# Patient Record
Sex: Male | Born: 1940
Health system: Southern US, Community
[De-identification: ages and names within clinical notes are randomized; demographics above are authoritative.]

## PROBLEM LIST (undated history)

## (undated) DIAGNOSIS — K2940 Chronic atrophic gastritis without bleeding: Secondary | ICD-10-CM

## (undated) DIAGNOSIS — Z800 Family history of malignant neoplasm of digestive organs: Secondary | ICD-10-CM

## (undated) DIAGNOSIS — Z860101 Personal history of adenomatous and serrated colon polyps: Secondary | ICD-10-CM

## (undated) DIAGNOSIS — J45909 Unspecified asthma, uncomplicated: Secondary | ICD-10-CM

## (undated) DIAGNOSIS — K219 Gastro-esophageal reflux disease without esophagitis: Secondary | ICD-10-CM

## (undated) DIAGNOSIS — G4730 Sleep apnea, unspecified: Secondary | ICD-10-CM

## (undated) DIAGNOSIS — M1990 Unspecified osteoarthritis, unspecified site: Secondary | ICD-10-CM

## (undated) DIAGNOSIS — G629 Polyneuropathy, unspecified: Secondary | ICD-10-CM

## (undated) DIAGNOSIS — K317 Polyp of stomach and duodenum: Secondary | ICD-10-CM

## (undated) DIAGNOSIS — T7840XA Allergy, unspecified, initial encounter: Secondary | ICD-10-CM

## (undated) DIAGNOSIS — S82409A Unspecified fracture of shaft of unspecified fibula, initial encounter for closed fracture: Secondary | ICD-10-CM

## (undated) DIAGNOSIS — Z86010 Personal history of colonic polyps: Secondary | ICD-10-CM

## (undated) DIAGNOSIS — I10 Essential (primary) hypertension: Secondary | ICD-10-CM

## (undated) DIAGNOSIS — D126 Benign neoplasm of colon, unspecified: Secondary | ICD-10-CM

## (undated) DIAGNOSIS — K449 Diaphragmatic hernia without obstruction or gangrene: Secondary | ICD-10-CM

## (undated) DIAGNOSIS — K2270 Barrett's esophagus without dysplasia: Secondary | ICD-10-CM

## (undated) DIAGNOSIS — G4733 Obstructive sleep apnea (adult) (pediatric): Secondary | ICD-10-CM

## (undated) DIAGNOSIS — Z9989 Dependence on other enabling machines and devices: Secondary | ICD-10-CM

## (undated) DIAGNOSIS — R498 Other voice and resonance disorders: Secondary | ICD-10-CM

## (undated) DIAGNOSIS — H269 Unspecified cataract: Secondary | ICD-10-CM

## (undated) DIAGNOSIS — K602 Anal fissure, unspecified: Secondary | ICD-10-CM

## (undated) DIAGNOSIS — C4492 Squamous cell carcinoma of skin, unspecified: Secondary | ICD-10-CM

## (undated) DIAGNOSIS — E785 Hyperlipidemia, unspecified: Secondary | ICD-10-CM

## (undated) HISTORY — DX: Family history of malignant neoplasm of digestive organs: Z80.0

## (undated) HISTORY — DX: Diaphragmatic hernia without obstruction or gangrene: K44.9

## (undated) HISTORY — PX: ESOPHAGOGASTRODUODENOSCOPY: SHX1529

## (undated) HISTORY — DX: Squamous cell carcinoma of skin, unspecified: C44.92

## (undated) HISTORY — DX: Barrett's esophagus without dysplasia: K22.70

## (undated) HISTORY — DX: Allergy, unspecified, initial encounter: T78.40XA

## (undated) HISTORY — DX: Polyp of stomach and duodenum: K31.7

## (undated) HISTORY — DX: Unspecified asthma, uncomplicated: J45.909

## (undated) HISTORY — DX: Sleep apnea, unspecified: G47.30

## (undated) HISTORY — DX: Personal history of adenomatous and serrated colon polyps: Z86.0101

## (undated) HISTORY — PX: CHOLECYSTECTOMY: SHX55

## (undated) HISTORY — DX: Essential (primary) hypertension: I10

## (undated) HISTORY — DX: Dependence on other enabling machines and devices: Z99.89

## (undated) HISTORY — DX: Other voice and resonance disorders: R49.8

## (undated) HISTORY — DX: Obstructive sleep apnea (adult) (pediatric): G47.33

## (undated) HISTORY — DX: Anal fissure, unspecified: K60.2

## (undated) HISTORY — DX: Polyneuropathy, unspecified: G62.9

## (undated) HISTORY — DX: Morbid (severe) obesity due to excess calories: E66.01

## (undated) HISTORY — DX: Unspecified osteoarthritis, unspecified site: M19.90

## (undated) HISTORY — DX: Unspecified fracture of shaft of unspecified fibula, initial encounter for closed fracture: S82.409A

## (undated) HISTORY — PX: POLYPECTOMY: SHX149

## (undated) HISTORY — DX: Unspecified cataract: H26.9

## (undated) HISTORY — DX: Benign neoplasm of colon, unspecified: D12.6

## (undated) HISTORY — DX: Chronic atrophic gastritis without bleeding: K29.40

## (undated) HISTORY — DX: Hyperlipidemia, unspecified: E78.5

## (undated) HISTORY — DX: Personal history of colonic polyps: Z86.010

## (undated) HISTORY — DX: Gastro-esophageal reflux disease without esophagitis: K21.9

## (undated) HISTORY — PX: APPENDECTOMY: SHX54

## (undated) HISTORY — PX: KNEE SURGERY: SHX244

## (undated) HISTORY — PX: COLONOSCOPY: SHX174

## (undated) HISTORY — PX: UPPER GASTROINTESTINAL ENDOSCOPY: SHX188

---

## 1997-11-18 ENCOUNTER — Encounter: Admission: RE | Admit: 1997-11-18 | Discharge: 1998-02-16 | Payer: Self-pay

## 1998-11-17 ENCOUNTER — Other Ambulatory Visit: Admission: RE | Admit: 1998-11-17 | Discharge: 1998-11-17 | Payer: Self-pay | Admitting: Gastroenterology

## 2000-03-20 ENCOUNTER — Ambulatory Visit (HOSPITAL_BASED_OUTPATIENT_CLINIC_OR_DEPARTMENT_OTHER): Admission: RE | Admit: 2000-03-20 | Discharge: 2000-03-20 | Payer: Self-pay | Admitting: Oral & Maxillofacial Surgery

## 2000-03-20 ENCOUNTER — Encounter: Payer: Self-pay | Admitting: Internal Medicine

## 2001-04-03 ENCOUNTER — Encounter: Payer: Self-pay | Admitting: Internal Medicine

## 2001-04-03 ENCOUNTER — Ambulatory Visit (HOSPITAL_BASED_OUTPATIENT_CLINIC_OR_DEPARTMENT_OTHER): Admission: RE | Admit: 2001-04-03 | Discharge: 2001-04-03 | Payer: Self-pay | Admitting: Internal Medicine

## 2003-03-18 ENCOUNTER — Encounter: Admission: RE | Admit: 2003-03-18 | Discharge: 2003-06-16 | Payer: Self-pay | Admitting: Cardiology

## 2004-01-08 ENCOUNTER — Encounter: Admission: RE | Admit: 2004-01-08 | Discharge: 2004-04-07 | Payer: Self-pay | Admitting: Orthopedic Surgery

## 2004-05-13 ENCOUNTER — Ambulatory Visit: Payer: Self-pay | Admitting: Cardiology

## 2005-06-27 HISTORY — PX: INCISION AND DRAINAGE PERIRECTAL ABSCESS: SHX1804

## 2005-10-06 ENCOUNTER — Ambulatory Visit: Payer: Self-pay | Admitting: Cardiology

## 2005-10-27 ENCOUNTER — Ambulatory Visit: Payer: Self-pay | Admitting: Cardiology

## 2006-05-03 ENCOUNTER — Ambulatory Visit: Payer: Self-pay | Admitting: Gastroenterology

## 2006-05-29 ENCOUNTER — Observation Stay (HOSPITAL_COMMUNITY): Admission: EM | Admit: 2006-05-29 | Discharge: 2006-05-29 | Payer: Self-pay | Admitting: Emergency Medicine

## 2006-07-26 ENCOUNTER — Ambulatory Visit: Payer: Self-pay | Admitting: Gastroenterology

## 2006-08-01 ENCOUNTER — Ambulatory Visit: Payer: Self-pay | Admitting: Gastroenterology

## 2006-08-01 ENCOUNTER — Encounter (INDEPENDENT_AMBULATORY_CARE_PROVIDER_SITE_OTHER): Payer: Self-pay | Admitting: *Deleted

## 2006-08-01 DIAGNOSIS — K2940 Chronic atrophic gastritis without bleeding: Secondary | ICD-10-CM | POA: Insufficient documentation

## 2007-05-18 ENCOUNTER — Ambulatory Visit: Payer: Self-pay | Admitting: Cardiology

## 2007-05-18 LAB — CONVERTED CEMR LAB
ALT: 26 units/L (ref 0–53)
AST: 26 units/L (ref 0–37)
Albumin: 4.1 g/dL (ref 3.5–5.2)
CO2: 31 meq/L (ref 19–32)
Calcium: 9.2 mg/dL (ref 8.4–10.5)
Cholesterol: 106 mg/dL (ref 0–200)
Creatinine, Ser: 0.8 mg/dL (ref 0.4–1.5)
GFR calc Af Amer: 124 mL/min
HDL: 38.3 mg/dL — ABNORMAL LOW (ref >39.0)
LDL Cholesterol: 47 mg/dL (ref 0–99)
Potassium: 4.3 meq/L (ref 3.5–5.1)
Total CHOL/HDL Ratio: 2.8

## 2007-06-14 ENCOUNTER — Ambulatory Visit: Payer: Self-pay | Admitting: Cardiology

## 2007-08-20 ENCOUNTER — Ambulatory Visit: Payer: Self-pay | Admitting: Cardiology

## 2007-08-20 LAB — CONVERTED CEMR LAB
ALT: 25 units/L (ref 0–53)
Albumin: 3.8 g/dL (ref 3.5–5.2)
Bilirubin, Direct: 0.1 mg/dL (ref 0.0–0.3)
HDL: 44.2 mg/dL (ref >39.0)
LDL Cholesterol: 99 mg/dL (ref 0–99)
Total Protein: 6.7 g/dL (ref 6.0–8.3)
VLDL: 30 mg/dL (ref 0–40)

## 2007-09-03 ENCOUNTER — Ambulatory Visit: Payer: Self-pay | Admitting: Cardiology

## 2007-09-25 ENCOUNTER — Encounter: Payer: Self-pay | Admitting: Internal Medicine

## 2007-10-12 DIAGNOSIS — J45909 Unspecified asthma, uncomplicated: Secondary | ICD-10-CM | POA: Insufficient documentation

## 2007-10-15 ENCOUNTER — Encounter: Payer: Self-pay | Admitting: Internal Medicine

## 2007-11-05 ENCOUNTER — Ambulatory Visit: Payer: Self-pay | Admitting: Internal Medicine

## 2007-11-05 DIAGNOSIS — G4730 Sleep apnea, unspecified: Secondary | ICD-10-CM | POA: Insufficient documentation

## 2007-11-05 DIAGNOSIS — G4733 Obstructive sleep apnea (adult) (pediatric): Secondary | ICD-10-CM | POA: Insufficient documentation

## 2007-12-23 ENCOUNTER — Encounter: Payer: Self-pay | Admitting: Internal Medicine

## 2007-12-27 ENCOUNTER — Ambulatory Visit: Payer: Self-pay | Admitting: Internal Medicine

## 2008-04-23 ENCOUNTER — Ambulatory Visit: Payer: Self-pay | Admitting: Cardiology

## 2008-04-23 LAB — CONVERTED CEMR LAB
ALT: 25 units/L (ref 0–53)
AST: 23 units/L (ref 0–37)
Albumin: 4 g/dL (ref 3.5–5.2)
BUN: 13 mg/dL (ref 6–23)
Bilirubin, Direct: 0.1 mg/dL (ref 0.0–0.3)
Calcium: 9.2 mg/dL (ref 8.4–10.5)
Creatinine, Ser: 0.8 mg/dL (ref 0.4–1.5)
GFR calc Af Amer: 124 mL/min
Glucose, Bld: 96 mg/dL (ref 70–99)
HDL: 42.3 mg/dL (ref >39.0)
Total CHOL/HDL Ratio: 3.7
Total Protein: 6.9 g/dL (ref 6.0–8.3)
Triglycerides: 122 mg/dL (ref 0–149)
VLDL: 24 mg/dL (ref 0–40)

## 2008-06-10 ENCOUNTER — Ambulatory Visit: Payer: Self-pay | Admitting: Cardiology

## 2008-07-02 ENCOUNTER — Telehealth (INDEPENDENT_AMBULATORY_CARE_PROVIDER_SITE_OTHER): Payer: Self-pay | Admitting: *Deleted

## 2008-07-09 ENCOUNTER — Telehealth: Payer: Self-pay | Admitting: Internal Medicine

## 2008-09-10 ENCOUNTER — Ambulatory Visit: Payer: Self-pay | Admitting: Internal Medicine

## 2008-09-10 DIAGNOSIS — R498 Other voice and resonance disorders: Secondary | ICD-10-CM | POA: Insufficient documentation

## 2008-09-11 DIAGNOSIS — K219 Gastro-esophageal reflux disease without esophagitis: Secondary | ICD-10-CM | POA: Insufficient documentation

## 2008-09-11 DIAGNOSIS — Z860100 Personal history of colon polyps, unspecified: Secondary | ICD-10-CM | POA: Insufficient documentation

## 2008-09-11 DIAGNOSIS — Z87898 Personal history of other specified conditions: Secondary | ICD-10-CM | POA: Insufficient documentation

## 2008-09-11 DIAGNOSIS — K317 Polyp of stomach and duodenum: Secondary | ICD-10-CM | POA: Insufficient documentation

## 2009-02-16 ENCOUNTER — Encounter (INDEPENDENT_AMBULATORY_CARE_PROVIDER_SITE_OTHER): Payer: Self-pay | Admitting: *Deleted

## 2009-04-20 ENCOUNTER — Telehealth: Payer: Self-pay | Admitting: Cardiology

## 2009-05-18 ENCOUNTER — Ambulatory Visit: Payer: Self-pay | Admitting: Internal Medicine

## 2009-05-27 DIAGNOSIS — E785 Hyperlipidemia, unspecified: Secondary | ICD-10-CM | POA: Insufficient documentation

## 2009-06-27 DIAGNOSIS — K2270 Barrett's esophagus without dysplasia: Secondary | ICD-10-CM

## 2009-06-27 HISTORY — DX: Barrett's esophagus without dysplasia: K22.70

## 2009-07-09 ENCOUNTER — Encounter (INDEPENDENT_AMBULATORY_CARE_PROVIDER_SITE_OTHER): Payer: Self-pay | Admitting: *Deleted

## 2009-08-10 ENCOUNTER — Encounter: Payer: Self-pay | Admitting: Cardiology

## 2009-08-12 ENCOUNTER — Encounter (INDEPENDENT_AMBULATORY_CARE_PROVIDER_SITE_OTHER): Payer: Self-pay | Admitting: *Deleted

## 2009-08-13 ENCOUNTER — Ambulatory Visit: Payer: Self-pay | Admitting: Internal Medicine

## 2009-08-18 ENCOUNTER — Ambulatory Visit: Payer: Self-pay | Admitting: Cardiology

## 2009-08-20 ENCOUNTER — Ambulatory Visit: Payer: Self-pay | Admitting: Internal Medicine

## 2009-08-27 ENCOUNTER — Encounter: Payer: Self-pay | Admitting: Internal Medicine

## 2009-09-08 ENCOUNTER — Encounter: Payer: Self-pay | Admitting: Internal Medicine

## 2009-11-26 ENCOUNTER — Encounter: Payer: Self-pay | Admitting: Internal Medicine

## 2010-07-27 NOTE — Letter (Signed)
Summary: Patient Notice- Polyp Results  [facility]  [address] [place], [place] [address]   Phone: [phone]  Fax: [phone]        August 27, 2009 MRN: [id number]    BUDDY LOEFFELHOLZ [address] [place], [place]  [address]    Dear Mr. Digangi,  One of the polyps removed from your colon was adenomatous. This means that it was pre-cancerous or that  it had the potential to change into cancer over time. The other polyp was not a true polyp.  I recommend that you have a repeat colonoscopy in 5 years to determine if you have developed any new polyps over time. If you develop any new rectal bleeding, abdominal pain or significant bowel habit changes, please contact us before then.  The stomach polyps were all benign and none were what we would generally consider to be precancerous. Due to your history of a pre-cancerous stomach polyp and the family history of gastric cancer, I recommend a repeat upper GI endoscopy in 3 years.  By the time you receive this letter you should have heard from [place] about an appointment at the [facility].  Continue treatment plan as outlined the day of your exam.  See me again for a routine follow-up by February 2012.  Please call us if you are having persistent problems or have questions about your condition that have not been fully answered at this time.  Sincerely,  Iva Boop MD, [facility]  This letter has been electronically signed by your physician.  Appended Document: Patient Notice- Polyp Results letter mailed 3.7.11

## 2010-07-27 NOTE — Consult Note (Signed)
Summary: [facility]  [facility]   Imported By: Lennie Odor 09/22/2009 13:52:20  _____________________________________________________________________  External Attachment:    Type:   Image     Comment:   External Document

## 2010-07-27 NOTE — Procedures (Signed)
Summary: Upper Endoscopy  Patient: Albert Barr Note: All result statuses are Final unless otherwise noted.  Tests: (1) Upper Endoscopy (EGD)   EGD Upper Endoscopy       DONE     [facility]     [address]     [place], [place]  [address]           ENDOSCOPY PROCEDURE REPORT           PATIENT:  Albert Barr, Albert Barr  MR#:  [id number]     BIRTHDATE:  Sep 16, 1940, 68 yrs. old  GENDER:  male           ENDOSCOPIST:  Iva Boop, MD, [facility]           PROCEDURE DATE:  08/20/2009     PROCEDURE:  EGD with biopsy     ASA CLASS:  Class II     INDICATIONS:  history of adenomatous gastric polyps (2003)     family history of gastric cancer (mother)           MEDICATIONS:   Fentanyl 75 mcg IV, Versed 8 mg IV     TOPICAL ANESTHETIC:  Exactacain Spray           DESCRIPTION OF PROCEDURE:   After the risks benefits and     alternatives of the procedure were thoroughly explained, informed     consent was obtained.  The LB GIF-H180 [id number] endoscope was     introduced through the mouth and advanced to the second portion of     the duodenum, without limitations.  The instrument was slowly     withdrawn as the mucosa was fully examined.     <<PROCEDUREIMAGES>>           Abnormal appearing mucosa in the bulb of the duodenum.     Nodular/polypoid mucosa in proximal bulb. Multiple biopsies were     obtained and sent to pathology.  There were multiple polyps     identified. in the fundus. Seen in body also. Mostly flat and     pale, especially in fundus. Representative biopsies taken.     Multiple biopsies were obtained and sent to pathology.  Otherwise     the examination was normal. Z-line at 45 cm.    Retroflexed views     revealed Retroflexion exam demonstrated findings as previously     described.    The scope was then withdrawn from the patient and     the procedure completed.           COMPLICATIONS:  None           ENDOSCOPIC IMPRESSION:     1) Abnormal mucosa in the bulb of  duodenum - nodular/polypoid -     biopsied     2) Polyps, multiple in the fundus and body of stomach - biopsied           3) Otherwise normal examination     RECOMMENDATIONS:     1) Await pathology results     He will need referral to [facility] at [facility] after pathology     reviewed (hoarseness).           REPEAT EXAM:  In for EGD, pending biopsy results.           Iva Boop, MD, Albert Barr           CC:  The Patient     Albert Heck, MD  n.     eSIGNED:   Iva Boop at 08/20/2009 11:47 AM           Albert Barr, [id number]  Note: An exclamation mark (!) indicates a result that was not dispersed into the flowsheet. Document Creation Date: 08/20/2009 11:47 AM _______________________________________________________________________  (1) Order result status: Final Collection or observation date-time: 08/20/2009 11:14 Requested date-time:  Receipt date-time:  Reported date-time:  Referring Physician:   Ordering Physician: Albert Barr [phone]) Specimen Source:  Source: Albert Barr Order Number: [id number] Lab site:   Appended Document: Upper Endoscopy Patient  is notified of appointment at [facility] voice center with Dr Delford Field 09-08-09 1:00.  Notes are faxed  Appended Document: Upper Endoscopy     Procedures Next Due Date:    EGD: 08/2012

## 2010-07-27 NOTE — Assessment & Plan Note (Signed)
Summary: f2y per pt call/lg  Medications Added NEXIUM 40 MG  CPDR (ESOMEPRAZOLE MAGNESIUM) 1 tab once daily FISH OIL 1000 MG CAPS (OMEGA-3 FATTY ACIDS) 1  cap two times a day * CPAP 15 APRIA at bedtime VENTOLIN HFA 108 (90 BASE) MCG/ACT AERS (ALBUTEROL SULFATE) as needed        Visit Type:  2 yr f/u Referring Provider:  n/a Primary Provider:  [name], MD   CC:  no cardiac complaints today.  History of Present Illness: Albert Barr comes in today for further evaluation and management of his mixed hyperlipidemia.  His weight continues to be about the same. He is looking into the possibility of bariatric surgery. One reason for this is that now his blood sugar is elevated.  His last cholesterol was 176, triglycerides of 220, LDL 96, HDL 50, total cholesterol HDL ratio 3.5. Fasting blood sugar was 111 and also 118. LFTs were normal.  He's had one episode of vertigo which seems to be positional since I last saw him. He denies any other neurological complaints. He's had no chest pain, angina, orthopnea, PND or peripheral edema.  Current Medications (verified): 1)  Crestor 20 Mg  Tabs (Rosuvastatin Calcium) .... Once Daily 2)  Accolate 20 Mg  Tabs (Zafirlukast) .... Two Times A Day 3)  Androgel Pump 1 %  Gel (Testosterone) .... Once Daily 4)  Propecia 1 Mg  Tabs (Finasteride) .... Once Daily 5)  Nexium 40 Mg  Cpdr (Esomeprazole Magnesium) .Marland Kitchen.. 1 Tab Once Daily 6)  Fish Oil 1000 Mg Caps (Omega-3 Fatty Acids) .Marland Kitchen.. 1  Cap Two Times A Day 7)  Adult Aspirin Ec Low Strength 81 Mg  Tbec (Aspirin) .... Once Daily 8)  Claritin 10 Mg  Tabs (Loratadine) .... Once Daily As Needed 9)  Cpap 15 [employer] .... At Bedtime 10)  Advair Diskus 100-50 Mcg/dose Aepb (Fluticasone-Salmeterol) .... Two Times A Day 11)  Moviprep 100 Gm  Solr (Peg-Kcl-Nacl-Nasulf-Na Asc-C) .... As Per Prep Instructions. 12)  Ventolin Hfa 108 (90 Base) Mcg/act Aers (Albuterol Sulfate) .... As Needed  Allergies: 1)  ! *  Vanceril 2)  ! Avelox  Past History:  Past Medical History: Last updated: 05/27/2009 MORBID OBESITY (ICD-278.01) HYPERLIPIDEMIA-MIXED (ICD-272.4) GERD (ICD-530.81) HIATAL HERNIA (ICD-553.3) HOARSENESS (ICD-784.49) FM HX GASTRIC CANCER - MOTHER (ICD-V16.0) GASTRIC POLYP, HX OF (ICD-V12.79) GASTRITIS, CHRONIC (ICD-535.10) COLONIC POLYPS, ADENOMATOUS, HX OF (ICD-V12.72) SARCOIDOSIS (ICD-135) SLEEP APNEA (ICD-780.57) ASTHMA (ICD-493.90) CHANGE IN BOWELS (ICD-787.99) Sleep Apnea NPSG 03/20/00 AHI 106 (wt 295), cpap 04/03/01 15 cwp- AHI 0 (wt 275)  Past Surgical History: Last updated: 11/05/2007 perirectal abscess- 2007 Appendectomy Cholecystectomy  Family History: Last updated: 05/18/2009 Stomach cancer- mother Bladder cancer- Sister No FH of Colon Cancer:  Social History: Last updated: 09/10/2008 Married Patient states former smoker.  Alcohol Use - yes Daily Caffeine Use Illicit Drug Use - no  Risk Factors: Smoking Status: quit (11/05/2007)  Review of Systems       negative other than history of present illness  Vital Signs:  Patient profile:   70 year old male Height:      69 inches Weight:      304 pounds BMI:     45.06 Pulse rate:   64 / minute Pulse rhythm:   irregular BP sitting:   118 / 80  (left arm) Cuff size:   large  Vitals Entered By: Danielle Rankin, CMA (August 18, 2009 12:36 PM)  Physical Exam  General:  obese.   Head:  normocephalic and  atraumatic Eyes:  PERRLA/EOM intact; conjunctiva and lids normal. Neck:  Neck supple, no JVD. No masses, thyromegaly or abnormal cervical nodes. Lungs:  Clear bilaterally to auscultation and percussion. Heart:  PMI heart to appreciate, soft S1-S2, regular rate and rhythm Msk:  decreased ROM.   Pulses:  pulses normal in all 4 extremities Extremities:  No clubbing or cyanosis.trace left pedal edema and trace right pedal edema.   Neurologic:  Alert and oriented x 3. Skin:  Intact without lesions or  rashes. Psych:  Normal affect.   Impression & Recommendations:  Problem # 1:  HYPERLIPIDEMIA-MIXED (ICD-272.4) Assessment Improved  His updated medication list for this problem includes:    Crestor 20 Mg Tabs (Rosuvastatin calcium) ..... Once daily  Problem # 2:  MORBID OBESITY (ICD-278.01) Assessment: Unchanged  I have encouraged him to look into bariatric surgery. This is particular so with glucose intolerance.  Orders: EKG w/ Interpretation (93000)  Patient Instructions: 1)  Your physician recommends that you schedule a follow-up appointment in: YEAR WITH DR Tzippy Testerman 2)  Your physician recommends that you continue on your current medications as directed. Please refer to the Current Medication list given to you today.

## 2010-07-27 NOTE — Letter (Signed)
Summary: Appointment - Missed  [facility]     [place], [place]    Phone:   Fax:      July 09, 2009 MRN: [id number]   [name] [address] [place], [place]  [address]   Dear Albert Barr,  Our records indicate you missed your appointment on  06-02-2009 with  DrDaleen Squibb it is very important that we reach you to reschedule this appointment. We look forward to participating in your health care needs. Please contact us at the number listed above at your earliest convenience to reschedule this appointment.     Sincerely,   Lorne Skeens  [facility] Scheduling Team

## 2010-07-27 NOTE — Miscellaneous (Signed)
Summary: [facility] Previsit/prep  Clinical Lists Changes  Medications: Added new medication of MOVIPREP 100 GM  SOLR (PEG-KCL-NACL-NASULF-NA ASC-C) As per prep instructions. - Signed Rx of MOVIPREP 100 GM  SOLR (PEG-KCL-NACL-NASULF-NA ASC-C) As per prep instructions.;  #1 x 0;  Signed;  Entered by: Wyona Almas RN;  Authorized by: Iva Boop MD, [facility];  Method used: Electronically to [facility]. #[id number]*, [address], [place], [place], [place]  [address], Ph: [phone], Fax: [phone] Observations: Added new observation of ALLERGY REV: Done (08/13/2009 10:39)    Prescriptions: MOVIPREP 100 GM  SOLR (PEG-KCL-NACL-NASULF-NA ASC-C) As per prep instructions.  #1 x 0   Entered by:   Wyona Almas RN   Authorized by:   Iva Boop MD, [facility]   Signed by:   Wyona Almas RN on 08/13/2009   Method used:   Electronically to        [facility]. [phone]* (retail)       [address]       [place], [place]  [address]       Ph: [phone]       Fax: [phone]   RxID:   [id number]

## 2010-07-27 NOTE — Procedures (Signed)
Summary: Colonoscopy  Patient: Albert Barr Note: All result statuses are Final unless otherwise noted.  Tests: (1) Colonoscopy (COL)   COL Colonoscopy           DONE     [facility]     [address]     [place], [place]  [address]           COLONOSCOPY PROCEDURE REPORT           PATIENT:  [name], [name]  MR#:  [id number]     BIRTHDATE:  1941-06-06, 68 yrs. old  GENDER:  male           ENDOSCOPIST:  Iva Boop, MD, [facility]           PROCEDURE DATE:  08/20/2009     PROCEDURE:  Colonoscopy with biopsy and snare polypectomy     ASA CLASS:  Class III     INDICATIONS:  history of pre-cancerous (adenomatous) colon polyps     multiple adenomas over the years and last had adenomas (small) in     2008           MEDICATIONS:  None           DESCRIPTION OF PROCEDURE:   After the risks benefits and     alternatives of the procedure were thoroughly explained, informed     consent was obtained.  No rectal exam performed. The LB CF-H180AL     [id number] endoscope was introduced through the anus and advanced to     the terminal ileum which was intubated for a short distance,     without limitations.  The quality of the prep was excellent, using     MoviPrep.  The instrument was then slowly withdrawn as the colon     was fully examined. Insrtion: 5:23 minutes Withdrawal: 8:48     minutes     <<PROCEDUREIMAGES>>           FINDINGS:  The terminal ileum appeared normal.  Two polyps were     found in the mid transverse colon. They were diminutive. 2 and 4     mm polyps The 2mm polyp was removed using cold biopsy forceps. The     4 mm polyp was snared without cautery. Retrieval was successful.     This was otherwise a normal examination of the colon including     retroflex exam of the right colon.  Retroflexed views in the     rectum revealed no abnormalities.    The scope was then withdrawn     from the patient and the procedure completed.           COMPLICATIONS:  None         ENDOSCOPIC IMPRESSION:     1) Normal terminal ileum     2) Two polyps in the mid transverse colon - removed     3) Otherwise normal examination, excellent prep     4) Prior adenomatous poly removal, last in 2008           REPEAT EXAM:  In for Colonoscopy, pending biopsy results.           Iva Boop, MD, [facility]           CC:  The Patient     [name], MD           n.     Rosalie Doctor:   Iva Boop at 08/20/2009 11:55 AM  [name], [name], [phone]  Note: An exclamation mark (!) indicates a result that was not dispersed into the flowsheet. Document Creation Date: 08/20/2009 11:55 AM _______________________________________________________________________  (1) Order result status: Final Collection or observation date-time: 08/20/2009 11:35 Requested date-time:  Receipt date-time:  Reported date-time:  Referring Physician:   Ordering Physician: Stan Head [phone]) Specimen Source:  Source: Launa Grill Order Number: [id number] Lab site:   Appended Document: Colonoscopy     Procedures Next Due Date:    Colonoscopy: 08/2014

## 2010-07-27 NOTE — Letter (Signed)
Summary: [facility]lies / [employer]  [facility] / [facility]   Imported By: Lennie Odor 12/01/2009 14:46:45  _____________________________________________________________________  External Attachment:    Type:   Image     Comment:   External Document

## 2010-07-27 NOTE — Letter (Signed)
Summary: [facility] Instructions  [facility]  [address] [place], [place] [address]   Phone: [phone]  Fax: [phone]       Albert Barr    Jan 02, 1941    MRN: [id number]        Procedure Day [name]:  Albert Barr  08/20/09     Arrival Time:  9:30AM     Procedure Time:  10:30AM     Location of Procedure:                    Albert Barr _  [facility] (4th Floor)   PREPARATION FOR COLONOSCOPY WITH MOVIPREP   Starting 5 days prior to your procedure 08/15/09 do not eat nuts, seeds, popcorn, corn, beans, peas,  salads, or any raw vegetables.  Do not take any fiber supplements (e.g. Metamucil, Citrucel, and Benefiber).  THE DAY BEFORE YOUR PROCEDURE         DATE: 08/19/09  DAY: WEDNESDAY  1.  Drink clear liquids the entire day-NO SOLID FOOD  2.  Do not drink anything colored red or purple.  Avoid juices with pulp.  No orange juice.  3.  Drink at least 64 oz. (8 glasses) of fluid/clear liquids during the day to prevent dehydration and help the prep work efficiently.  CLEAR LIQUIDS INCLUDE: Water Jello Ice Popsicles Tea (sugar ok, no milk/cream) Powdered fruit flavored drinks Coffee (sugar ok, no milk/cream) Gatorade Juice: apple, white grape, white cranberry  Lemonade Clear bullion, consomm, broth Carbonated beverages (any kind) Strained chicken noodle soup Hard Candy                             4.  In the morning, mix first dose of MoviPrep solution:    Empty 1 Pouch A and 1 Pouch B into the disposable container    Add lukewarm drinking water to the top line of the container. Mix to dissolve    Refrigerate (mixed solution should be used within 24 hrs)  5.  Begin drinking the prep at 5:00 p.m. The MoviPrep container is divided by 4 marks.   Every 15 minutes drink the solution down to the next mark (approximately 8 oz) until the full liter is complete.   6.  Follow completed prep with 16 oz of clear liquid of your choice (Nothing red or purple).   Continue to drink clear liquids until bedtime.  7.  Before going to bed, mix second dose of MoviPrep solution:    Empty 1 Pouch A and 1 Pouch B into the disposable container    Add lukewarm drinking water to the top line of the container. Mix to dissolve    Refrigerate  THE DAY OF YOUR PROCEDURE      DATE: 08/20/09  DAY: THURSDAY  Beginning at 5:30a.m. (5 hours before procedure):         1. Every 15 minutes, drink the solution down to the next mark (approx 8 oz) until the full liter is complete.  2. Follow completed prep with 16 oz. of clear liquid of your choice.    3. You may drink clear liquids until 8:30AM (2 HOURS BEFORE PROCEDURE).   MEDICATION INSTRUCTIONS  Unless otherwise instructed, you should take regular prescription medications with a small sip of water   as early as possible the morning of your procedure.         OTHER INSTRUCTIONS  You will need a responsible adult at  least 70 years of age to accompany you and drive you home.   This person must remain in the waiting room during your procedure.  Wear loose fitting clothing that is easily removed.  Leave jewelry and other valuables at home.  However, you may wish to bring a book to read or  an iPod/MP3 player to listen to music as you wait for your procedure to start.  Remove all body piercing jewelry and leave at home.  Total time from sign-in until discharge is approximately 2-3 hours.  You should go home directly after your procedure and rest.  You can resume normal activities the  day after your procedure.  The day of your procedure you should not:   Drive   Make legal decisions   Operate machinery   Drink alcohol   Return to work  You will receive specific instructions about eating, activities and medications before you leave.    The above instructions have been reviewed and explained to me by   Albert Almas RN  August 13, 2009 11:06 AM     I fully understand and can verbalize  these instructions _____________________________ Date _________

## 2010-07-29 ENCOUNTER — Encounter: Payer: Self-pay | Admitting: Internal Medicine

## 2010-08-12 NOTE — Letter (Signed)
Summary: [facility] for Supplies / [facility]  [facility] for Supplies / [facility]   Imported By: Lennie Odor 08/04/2010 14:27:14  _____________________________________________________________________  External Attachment:    Type:   Image     Comment:   External Document

## 2010-11-09 NOTE — Assessment & Plan Note (Signed)
[facility]                            CARDIOLOGY OFFICE NOTE   DERIAN, DIMALANTA                  MRN:          [id number]  DATE:06/10/2008                            DOB:          01/07/1941    Albert Barr returns today for followup.   This morning while he was sitting at the breakfast table, he felt a  swoon sensation in his head and it lasted few seconds.  He was little  bit dizzy when he stood up.  He denied any true vertigo, nausea,  vomiting, diaphoresis, or chest discomfort.  He had no tachy  palpitations.  This is an isolated event, he has not had this before.   He continues to work on his weight which is down another 6 pounds.  His  blood work looked remarkably good at goal on Crestor 20 this past month.  I have reviewed those data with him including his other blood work.   MEDICATIONS:  1. Crestor 20 mg a day.  2. Alkylate 20 mg b.i.d.  3. AndroGel 1% cream daily.  4. Propecia 1 mg daily.  5. Nexium 40 mg a day.  6. Fish oil b.i.d.  7. Serevent 50 mg b.i.d.  8. Aspirin 81 mg p.o. q.a.m.   PHYSICAL EXAMINATION:  VITAL SIGNS:  Blood pressure today is 138/80,  pulse of 79 and regular, his weight is 298.  HEENT:  Normal.  PERRLA, extraocular is intact.  Sclera clear.  Facial  symmetry is normal.  Speech is normal.  He is alert and oriented x3.  Cranial nerves II through XII are intact.  Carotid upstrokes were equal  bilaterally without bruits, no JVD.  Thyroid is not enlarged.  Trachea  is midline.  LUNGS:  Clear to auscultation and percussion.  HEART:  Poorly appreciated PMI, soft S1, S2.  No gallop, rub, or murmur.  ABDOMINAL:  Soft, good bowel sounds.  No midline bruit.  No  hepatomegaly.  EXTREMITIES:  No cyanosis, clubbing, or edema.  Pulses are intact.  There is no sign of DVT.   I have reviewed Mr. Livers medications with him.  He will continue as  is and also reviewed his blood work.  If he has another episode of  this  dizziness or any other symptoms he will let me know.  Otherwise, we will  see him back in a year.     Thomas C. Daleen Squibb, MD, [facility]  Electronically Signed   TCW/MedQ  DD: 06/10/2008  DT: 06/11/2008  Job #: [id number]

## 2010-11-09 NOTE — Assessment & Plan Note (Signed)
[facility]                            CARDIOLOGY OFFICE NOTE   Tyrail, [place] ARMANDO BUKHARI                  MRN:          [id number]  DATE:09/03/2007                            DOB:          08/14/1940    Mr. Rosiles comes back today after having an adjustment made by stopping  his Zetia.  He has a history of mixed hyperlipidemia and severe obesity.   His weight shifted back up to about 6 pounds unfortunately. He has some  peripheral edema that is particularly apparent above his sock line.   His labs show on Crestor 20 daily a total cholesterol 173, triglycerides  150, HDL went up 6 points to 44.2, hence total cholesterol HDL ratio  3.9, VLDL was 30 and his LDL is 99.   LFTs were normal.   Other than the edema, he has no complaints.  He denies any ischemic  symptoms.   Blood pressure is 129/62, pulse 74 and regular, weight is 304.  HEENT:  No change.  NECK:  Shows no JVD though difficult to assess.  Carotids are full.  LUNGS:  Clear.  HEART:  Reveals regular rate and rhythm.  No gallop.  ABDOMEN:  Is obese, organomegaly could not be assessed.  EXTREMITIES:  Reveal 2+ pitting edema above the sock line.  His  peripheral pulses are 2+/4+ dorsi dorsalis pedis and posterior tibial.  There is no sign of DVT.  No sign of cellulitis.  NEURO:  Exam is intact.   ASSESSMENT/PLAN:  Mr. Olivero numbers are acceptable off Zetia.  At this  point in time, I would like for him stay on Crestor 20 mg a day.  He  will remain on aspirin and fish oil.  I have advised him to get back on  his weight loss program and increase his activity.  I have also advised  him to get support stockings up to his knees at least.  I have also  instructed him about sitting too long, crossing his legs and also  keeping his legs elevated.  I have also talked about sodium restriction.   I will see him back again in 6 months.     Thomas C. Daleen Squibb, MD, [facility]  Electronically Signed    TCW/MedQ  DD: 09/03/2007  DT: 09/03/2007  Job #: [id number]   cc:   Lonzo Cloud. Kriste Basque, MD

## 2010-11-09 NOTE — Assessment & Plan Note (Signed)
[facility]                            CARDIOLOGY OFFICE NOTE   [name], [name]                  MRN:          [id number]  DATE:06/14/2007                            DOB:          [birth year]    Albert Barr comes in today for further management of his mixed  hyperlipidemia.  He has lost an additional 6 pounds since I last saw  him.  Our last visit was Oct 27, 2005.   He came for blood work and his total cholesterol was down to 106,  triglycerides were 103, HDL was 38.3, down from 53, LDL was 47.  His  total cholesterol with HDL ratio was still low at 2.8.  LFTs were  normal.  Fasting blood sugar was 117, which is mildly elevated, A1c was  5.6, which is normal.   He seems to be doing well overall.  He continues to follow the [facility] Diet as best he can.   CURRENT REGIMEN:  1. Crestor 20 mg a day.  2. Zetia 10 mg a day.  3. Aspirin 81 mg a day.  4. Fish Oil.   Blood pressure is 145/79, his pulse 65 and regular.  His  electrocardiogram is normal except for left axis deviation which is  consistent with a left anterior fascicular block which is stable.  His  weight is 296.  HEENT:  Normocephalic, atraumatic, PERRLA, extraocular movements intact,  sclera clear, facial symmetry is normal.  His skin is kind of cool and  clammy, which is baseline.  His respiratory rate is 18, unlabored.  NECK:  Shows no JVD, carotids are full without bruits.  There is no  thyromegaly.  LUNGS:  Clear.  HEART:  A poorly appreciated PMI.  He has normal S1 S2.  ABDOMINAL EXAM:  Obese, making organomegaly difficult to assess.  EXTREMITIES:  Trace edema, pulses are present.   I had a nice chat with Albert Barr.  I have recommended that he cut out  the Zetia since this is the drug in question clinically, not to mention  that we dropped his HDL from 53 to 38.  His goal LDL certainly should be  in the 70 or less range, which may be very achievable with just  Crestor  and Fish Oil alone.   Will check blood work again in about 3 months.  I will follow up with  him at that time.     Thomas C. Daleen Squibb, MD, [facility]  Electronically Signed    TCW/MedQ  DD: 06/14/2007  DT: 06/15/2007  Job #: [id number]

## 2010-11-12 NOTE — Assessment & Plan Note (Signed)
[place] [facility]                         GASTROENTEROLOGY OFFICE NOTE   BETHEL, GAGLIO                  MRN:          [id number]  DATE:07/26/2006                            DOB:          February 18, 1941    Albert Barr comes in and said that he has had trouble recently with an anal  fistula that was treated with an I&D by Dr. Abbey Chatters at the emergency  room. He also had some problems with hemorrhoids. He has had some recent  GERD problems but says that he has handled it pretty well with his  proton pump inhibitor which now is Nexium 400 mg daily. He takes fish  oil as well as Zetia, baby aspirin, Propecia, AndroGel, Accolate, Advair  and Crestor. He uses Claritin as well.   His last colonoscopic examination did not reveal significant pathology  but the ones before have always revealed multiple polyps, some quite  large. His last upper endoscopic examination revealed changes of chronic  gastritis with moderately large folds and moderate duodenitis. There was  a questionable amount of polyp in the pyloric channel on a previous  upper endoscopic procedure. Albert Barr's mother died from stomach cancer and  I think he is always concerned about this along with the number of  polyps he has always had in his colon.   PHYSICAL EXAMINATION:  He weighs 295, blood pressure 112/68, pulse 80 and regular. Neck, heart  and extremities all basically unchanged.   IMPRESSION:  1. Gastroesophageal reflux disease with gastritis treated with Nexium.  2. Status post multiple colon polyps.  3. Moderate to morbid obesity.  4. Hyperlipidemia.  5. Asthma.  6. Anal fissure.  7. Hemorrhoids.   PLAN:  We are going to schedule him for his procedures, determine the  extent of his hemorrhoids and have him see Dr. Kendrick Ranch for further  therapy if the situation does not improve. He does have some  intermittent bleeding and he is concerned about the bleeding whether it  is hemorrhoidal  or from another possible source in his colon.     Albert Mort, MD  Electronically Signed    SML/MedQ  DD: 07/26/2006  DT: 07/26/2006  Job #: [id number]

## 2010-11-12 NOTE — H&P (Signed)
NAME:  Albert Barr, Albert Barr NO.:  [id number]   MEDICAL RECORD NO.:  [id number]          PATIENT TYPE:  INP   LOCATION:  1511                         FACILITY:  [facility]   PHYSICIAN:  Adolph Pollack, M.D.DATE OF BIRTH:  04-Aug-1940   DATE OF ADMISSION:  05/28/2006  DATE OF DISCHARGE:  05/29/2006                              HISTORY & PHYSICAL   REASON FOR ADMISSION:  Abscess.   HISTORY OF PRESENT ILLNESS:  This 70 year old male had the onset of  perirectal pain on Tuesday, thought it was hemorrhoids, apparently got  some hemorrhoidal cream and tried to treat it, but the pain persisted,  had fever and presented to his primary care physician who examined him  and felt he probably had a perirectal abscess and told him he might need  to have it drained but started  him on oral antibiotics.  He began  having some fever and yesterday still had the pain but felt that he had  not been on the antibiotics long enough.  Subsequently he went to a walk-  in clinic today and was told it definitely needed drainage and was sent  to the emergency department where he was evaluated.  It was felt to be  too complex for drainage in the emergency room;  thus, I was called to  see him.   PAST MEDICAL HISTORY:  1. Hypercholesterolemia.  2. Asthma.  3. Gastroesophageal reflux.   PREVIOUS OPERATIONS:  1. Appendectomy.  2. Laparoscopic cholecystectomy.   ALLERGIES:  None.   MEDICATIONS:  Crestor, Zetia, Nexium, Advair Diskus, AndroGel, aspirin,  Propecia, Keflex, Flagyl, Vicodin.   SOCIAL HISTORY:  He lives with his wife.  Occasional alcohol use.  No  tobacco use.   REVIEW OF SYSTEMS:  CARDIAC: He denies any heart disease or  hypertension. GU: No difficulty with urination.  No kidney stones.  ENDOCRINE: No diabetes or thyroid disease.  NEUROLOGIC:  No strokes or  seizures.   PHYSICAL EXAMINATION:  GENERAL:  A morbidly obese male.  Vex  Weight 311 pounds.  Temperature 98.5, blood  pressure 134/84, pulse  85.  EYES:  Extraocular motions intact.  No icterus.  NECK: Supple without obvious masses.  RESPIRATORY:  Breath sounds equal and clear. Respirations unlabored.  CARDIOVASCULAR:  Demonstrates regular rate, regular rhythm. I do not  hear a murmur.  ABDOMEN: Soft and obese with a right lower quadrant scar.  There is no  tenderness present.  GU/ANORECTAL:  In the left perirectal area, there is a firmness,  induration, and erythema, very tender to touch but no obvious fluctuant  area.  EXTREMITIES: No cyanosis or edema.   LABORATORY DATA:  White count is normal at 9600. Glucose slightly  elevated at 111.   IMPRESSION:  Complex perirectal abscess.   PLAN:  Incision and drainage in the operating room.  I discussed the  procedure and risks with him and his wife.  Risks include but not  limited to bleeding, infection and wound healing problems well as need  for redo operation.  I also told him about the potential for recurrence  of the  abscess or fistula.  They seemed to understand this and agree to  proceed.      Adolph Pollack, M.D.  Electronically Signed     TJR/MEDQ  D:  05/29/2006  T:  05/29/2006  Job:  [id number]   cc:   Jethro Bastos, M.D.  Fax: [phone]

## 2010-11-12 NOTE — Op Note (Signed)
[name], Albert Barr NO.:  [id number]   MEDICAL RECORD NO.:  [id number]          PATIENT TYPE:  INP   LOCATION:  0098                         FACILITY:  [facility]   PHYSICIAN:  Adolph Pollack, M.D.DATE OF BIRTH:  [birth year]   DATE OF PROCEDURE:  05/29/2006  DATE OF DISCHARGE:                               OPERATIVE REPORT   PREOPERATIVE DIAGNOSIS:  Right perirectal abscess.   POSTOPERATIVE DIAGNOSIS:  Right perirectal abscess.   OPERATION PERFORMED:  Complex incision and drainage of right perirectal  abscess.   SURGEON:  Adolph Pollack, M.D.   ANESTHESIA:  General.   INDICATIONS FOR PROCEDURE:  This 70 year old male presented to the  emergency department with increasing pain and was found to have a  perirectal abscess.  It is too large to drain in the emergency room.  He  is now brought to the operating room for drainage.   DESCRIPTION OF PROCEDURE:  He was brought to the operating room and  placed on the operating table and was given a general anesthetic.  He  was then placed in a lithotomy position.  The perianal area sterilely  prepped and draped.  Using a 19 gauge needle I identified the purulent  area in the right perianal/perirectal region.  I then made a triangular  shaped full thickness skin incision.  I then evacuated purulent material  some of which was sent for culture.  I enlarged the incision slightly  and used my finger to break up loculations of purulent material.  I did  not notice any other loculations at this time.  I then controlled the  bleeding with electrocautery.  The tract was approximately 5  to 6 cm  deep in the right perirectal area.  I placed a half inch Penrose drain  up into the tract and anchored it to the skin with 3-0 chromic suture.  I then packed the wound with saline soaked gauze followed by a bulky  dressing.   The patient tolerated the procedure well without any apparent  complication and was taken to recovery  room in satisfactory condition.  He has sleep apnea so we will ask his wife to get a CPAP machine so he  can use it tonight.      Adolph Pollack, M.D.  Electronically Signed     TJR/MEDQ  D:  05/29/2006  T:  05/29/2006  Job:  [id number]

## 2010-11-12 NOTE — Assessment & Plan Note (Signed)
[place] [facility]                           GASTROENTEROLOGY OFFICE NOTE   Albert Barr, Albert Barr                  MRN:          [id number]  DATE:05/03/2006                            DOB:          12/05/1940    Albert Barr comes in, says he has been having some stomach burning, and he had  been taking Zegerid, but still has occasional breakthrough.  We talked about  a number of things about medications and diet, as well as alcohol.  It  appears that he is drinking more alcohol than he needs to be, and we talked  about the risk here and how moderation would be so much better for him,  especially in concern about his overall weight problems.  I think he has  even gotten heavier since I last saw him.  We talked about exercise and how  he needs to be more cognizant of himself and take care of himself.   PHYSICAL EXAMINATION:  He weighted 308 pounds.  Blood pressure 128/60.  Pulse 76 and regular.  NECK, ABDOMEN AND EXTREMITIES:  All unremarkable except for his significant  obesity.   IMPRESSION:  1. Gastroesophageal reflux disease and gastritis, symptomatic even on 1      Zegerid daily.  2. Status post colon polyps.  3. Hyperlipidemia.  4. Morbid obesity.   Recommendation is that he decrease his alcohol intake significantly and  start taking Nexium 1 or 2 a day to be taken at the morning and at dinner  time and I gave him samples and prescriptions for these.  Albert Barr is very  anxious about his colon since he has had multiple polyps in the past and  says he would like to have a followup examination sometime next year, and I  told him that we would scheduled it at his convenience.  I do not think that  an upper endoscopy is necessary unless his symptoms persist.  He did have  some thickened folds consistent with chronic gastritis before, and I think  that if he cuts his alcoholic intake down and takes the Nexium as  prescribed, hopefully his symptoms will  abate.     Albert Mort, MD  Electronically Signed    SML/MedQ  DD: 05/03/2006  DT: 05/04/2006  Job #: [id number]

## 2011-05-26 DIAGNOSIS — K219 Gastro-esophageal reflux disease without esophagitis: Secondary | ICD-10-CM | POA: Insufficient documentation

## 2011-05-26 DIAGNOSIS — E7800 Pure hypercholesterolemia, unspecified: Secondary | ICD-10-CM | POA: Insufficient documentation

## 2011-05-26 DIAGNOSIS — M2550 Pain in unspecified joint: Secondary | ICD-10-CM | POA: Insufficient documentation

## 2011-05-31 ENCOUNTER — Ambulatory Visit: Payer: Medicare Other | Admitting: Rehabilitative and Restorative Service Providers"

## 2011-06-01 ENCOUNTER — Ambulatory Visit: Payer: BC Managed Care – PPO

## 2011-06-06 ENCOUNTER — Ambulatory Visit: Payer: Medicare Other | Attending: Pain Medicine

## 2011-06-06 ENCOUNTER — Ambulatory Visit: Payer: Medicare Other

## 2011-06-06 ENCOUNTER — Ambulatory Visit: Payer: BC Managed Care – PPO

## 2011-06-06 ENCOUNTER — Ambulatory Visit: Payer: Medicare Other | Attending: Orthopedic Surgery

## 2011-06-06 DIAGNOSIS — M25519 Pain in unspecified shoulder: Secondary | ICD-10-CM | POA: Insufficient documentation

## 2011-06-06 DIAGNOSIS — IMO0001 Reserved for inherently not codable concepts without codable children: Secondary | ICD-10-CM | POA: Insufficient documentation

## 2011-06-06 DIAGNOSIS — M2560 Stiffness of unspecified joint, not elsewhere classified: Secondary | ICD-10-CM | POA: Insufficient documentation

## 2011-06-06 DIAGNOSIS — R262 Difficulty in walking, not elsewhere classified: Secondary | ICD-10-CM | POA: Insufficient documentation

## 2011-06-06 DIAGNOSIS — M25569 Pain in unspecified knee: Secondary | ICD-10-CM | POA: Insufficient documentation

## 2011-06-08 ENCOUNTER — Encounter: Payer: Self-pay | Admitting: *Deleted

## 2011-06-08 ENCOUNTER — Ambulatory Visit: Payer: Medicare Other | Admitting: Physical Therapy

## 2011-06-09 ENCOUNTER — Encounter: Payer: Self-pay | Admitting: Cardiology

## 2011-06-09 ENCOUNTER — Ambulatory Visit (INDEPENDENT_AMBULATORY_CARE_PROVIDER_SITE_OTHER): Payer: Medicare Other | Admitting: Cardiology

## 2011-06-09 DIAGNOSIS — E785 Hyperlipidemia, unspecified: Secondary | ICD-10-CM

## 2011-06-09 DIAGNOSIS — Z01818 Encounter for other preprocedural examination: Secondary | ICD-10-CM | POA: Insufficient documentation

## 2011-06-09 NOTE — Assessment & Plan Note (Signed)
I have cleared him for surgery at a low operative risk from a cardiac standpoint. He is having no angina, blood pressure is under good control, and this will have major benefit in terms of his cardiovascular future not to mention hopefully curing his diabetes for a time. It also should help his sleep apnea. I've given him a note for clearance. I'll see him back in a year.

## 2011-06-09 NOTE — Patient Instructions (Signed)
Your physician recommends that you continue on your current medications as directed. Please refer to the Current Medication list given to you today.  Your physician wants you to follow-up in: 1 year with Dr. Daleen Squibb. You will receive a reminder letter in the mail two months in advance. If you don't receive a letter, please call our office to schedule the follow-up appointment.  You have been cleared for your bariatric surgery by Dr. Daleen Squibb today.

## 2011-06-09 NOTE — Progress Notes (Signed)
HPI Albert Barr comes in today for preoperative clearanceFor bariatric surgery for morbid obesity. He is to have this done at [facility] probably in February. He has been going through their program and is actually lost some weight.  He is having no angina or chest discomfort. He has no dyspnea on exertion. He denies orthopnea, PND, edema.  His laboratory data being checked by his primary care. He is borderline diabetic.  Past Medical History  Diagnosis Date  . Morbid obesity   . Other and unspecified hyperlipidemia   . Esophageal reflux   . Diaphragmatic hernia without mention of obstruction or gangrene   . Other voice and resonance disorders   . Atrophic gastritis without mention of hemorrhage   . Sarcoidosis   . Unspecified sleep apnea   . Unspecified asthma   . Change in bowel function   . Obstructive sleep apnea on CPAP   . Gastric polyps     history  . Hx of adenomatous colonic polyps     Current Outpatient Prescriptions  Medication Sig Dispense Refill  . albuterol (PROVENTIL HFA;VENTOLIN HFA) 108 (90 BASE) MCG/ACT inhaler Inhale 2 puffs into the lungs every 6 (six) hours as needed.        Marland Kitchen aspirin 81 MG tablet Take 81 mg by mouth daily.        Marland Kitchen esomeprazole (NEXIUM) 40 MG capsule Take 40 mg by mouth daily before breakfast.        . fish oil-omega-3 fatty acids 1000 MG capsule Take 1 g by mouth 2 (two) times daily.        . Fluticasone-Salmeterol (ADVAIR) 100-50 MCG/DOSE AEPB Inhale 1 puff into the lungs every 12 (twelve) hours.        Marland Kitchen loratadine (CLARITIN) 10 MG tablet Take 10 mg by mouth daily.        . rosuvastatin (CRESTOR) 20 MG tablet Take 20 mg by mouth daily.        Marland Kitchen testosterone (ANDROGEL) 50 MG/5GM GEL Place 5 g onto the skin daily.        . zafirlukast (ACCOLATE) 20 MG tablet Take 20 mg by mouth 2 (two) times daily.          Allergies  Allergen Reactions  . Moxifloxacin     REACTION: Diarrhea    Family History  Problem Relation Age of Onset  .  Stomach cancer Mother   . Cancer Sister     bladder    History   Social History  . Marital Status: Married    Spouse Name: N/A    Number of Children: N/A  . Years of Education: N/A   Occupational History  . Not on file.   Social History Main Topics  . Smoking status: Former Games developer  . Smokeless tobacco: Never Used  . Alcohol Use: Yes  . Drug Use: No  . Sexually Active: Not on file   Other Topics Concern  . Not on file   Social History Narrative  . No narrative on file    ROS ALL NEGATIVE EXCEPT THOSE NOTED IN HPI  PE  General Appearance: well developed, well nourished in no acute distress, obese HEENT: symmetrical face, PERRLA, good dentition  Neck: no JVD, thyromegaly, or adenopathy, trachea midline Chest: symmetric without deformity Cardiac: PMI non-displaced, RRR, normal S1, S2, no gallop or murmur Lung: clear to ausculation and percussion Vascular: all pulses full without bruits  Abdominal: nondistended, nontender, good bowel sounds, no HSM, no bruits Extremities: no cyanosis, clubbing ,  trace edema, no sign of DVT, no varicosities  Skin: normal color, no rashes Neuro: alert and oriented x 3, non-focal Pysch: normal affect  EKG Sinus bradycardia, PACs, left axis deviation. BMET    Component Value Date/Time   NA 145 04/23/2008 1012   K 4.1 04/23/2008 1012   CL 108 04/23/2008 1012   CO2 31 04/23/2008 1012   GLUCOSE 96 04/23/2008 1012   BUN 13 04/23/2008 1012   CREATININE 0.8 04/23/2008 1012   CALCIUM 9.2 04/23/2008 1012   GFRNONAA 102 04/23/2008 1012   GFRAA 124 04/23/2008 1012    Lipid Panel     Component Value Date/Time   CHOL 156 04/23/2008 1012   TRIG 122 04/23/2008 1012   HDL 42.3 04/23/2008 1012   CHOLHDL 3.7 CALC 04/23/2008 1012   VLDL 24 04/23/2008 1012   LDLCALC 89 04/23/2008 1012    CBC No results found for this basename: wbc, rbc, hgb, hct, plt, mcv, mch, mchc, rdw, neutrabs, lymphsabs, monoabs, eosabs, basosabs

## 2011-06-15 ENCOUNTER — Ambulatory Visit: Payer: Medicare Other

## 2011-06-17 ENCOUNTER — Ambulatory Visit: Payer: Medicare Other

## 2011-07-05 ENCOUNTER — Ambulatory Visit: Payer: BC Managed Care – PPO | Attending: Pain Medicine

## 2011-07-05 DIAGNOSIS — IMO0001 Reserved for inherently not codable concepts without codable children: Secondary | ICD-10-CM | POA: Insufficient documentation

## 2011-07-05 DIAGNOSIS — M25519 Pain in unspecified shoulder: Secondary | ICD-10-CM | POA: Insufficient documentation

## 2011-07-05 DIAGNOSIS — M25569 Pain in unspecified knee: Secondary | ICD-10-CM | POA: Insufficient documentation

## 2011-07-05 DIAGNOSIS — M2560 Stiffness of unspecified joint, not elsewhere classified: Secondary | ICD-10-CM | POA: Insufficient documentation

## 2011-07-05 DIAGNOSIS — R262 Difficulty in walking, not elsewhere classified: Secondary | ICD-10-CM | POA: Insufficient documentation

## 2011-07-07 ENCOUNTER — Ambulatory Visit: Payer: BC Managed Care – PPO

## 2011-07-11 ENCOUNTER — Ambulatory Visit: Payer: BC Managed Care – PPO

## 2011-07-19 ENCOUNTER — Ambulatory Visit: Payer: BC Managed Care – PPO

## 2011-07-21 ENCOUNTER — Ambulatory Visit: Payer: BC Managed Care – PPO

## 2011-07-26 ENCOUNTER — Ambulatory Visit: Payer: BC Managed Care – PPO

## 2011-07-28 ENCOUNTER — Ambulatory Visit: Payer: BC Managed Care – PPO

## 2011-08-01 ENCOUNTER — Ambulatory Visit: Payer: BC Managed Care – PPO | Attending: Pain Medicine

## 2011-08-01 DIAGNOSIS — M2560 Stiffness of unspecified joint, not elsewhere classified: Secondary | ICD-10-CM | POA: Insufficient documentation

## 2011-08-01 DIAGNOSIS — M25569 Pain in unspecified knee: Secondary | ICD-10-CM | POA: Insufficient documentation

## 2011-08-01 DIAGNOSIS — M25519 Pain in unspecified shoulder: Secondary | ICD-10-CM | POA: Insufficient documentation

## 2011-08-01 DIAGNOSIS — IMO0001 Reserved for inherently not codable concepts without codable children: Secondary | ICD-10-CM | POA: Insufficient documentation

## 2011-08-01 DIAGNOSIS — R262 Difficulty in walking, not elsewhere classified: Secondary | ICD-10-CM | POA: Insufficient documentation

## 2011-08-08 ENCOUNTER — Ambulatory Visit: Payer: BC Managed Care – PPO

## 2011-08-11 ENCOUNTER — Ambulatory Visit: Payer: BC Managed Care – PPO

## 2011-08-16 ENCOUNTER — Ambulatory Visit: Payer: BC Managed Care – PPO

## 2011-08-18 ENCOUNTER — Ambulatory Visit: Payer: BC Managed Care – PPO

## 2011-08-29 ENCOUNTER — Ambulatory Visit: Payer: BC Managed Care – PPO | Attending: Orthopedic Surgery

## 2011-08-29 DIAGNOSIS — M2560 Stiffness of unspecified joint, not elsewhere classified: Secondary | ICD-10-CM | POA: Insufficient documentation

## 2011-08-29 DIAGNOSIS — IMO0001 Reserved for inherently not codable concepts without codable children: Secondary | ICD-10-CM | POA: Insufficient documentation

## 2011-08-29 DIAGNOSIS — M25519 Pain in unspecified shoulder: Secondary | ICD-10-CM | POA: Insufficient documentation

## 2011-09-09 ENCOUNTER — Ambulatory Visit: Payer: BC Managed Care – PPO

## 2011-09-19 ENCOUNTER — Encounter: Payer: Self-pay | Admitting: *Deleted

## 2011-09-20 ENCOUNTER — Ambulatory Visit (INDEPENDENT_AMBULATORY_CARE_PROVIDER_SITE_OTHER): Payer: BC Managed Care – PPO | Admitting: Internal Medicine

## 2011-09-20 ENCOUNTER — Encounter: Payer: Self-pay | Admitting: Internal Medicine

## 2011-09-20 VITALS — BP 116/60 | HR 64 | Ht 70.0 in | Wt 283.0 lb

## 2011-09-20 DIAGNOSIS — R195 Other fecal abnormalities: Secondary | ICD-10-CM

## 2011-09-20 DIAGNOSIS — R490 Dysphonia: Secondary | ICD-10-CM

## 2011-09-20 DIAGNOSIS — K219 Gastro-esophageal reflux disease without esophagitis: Secondary | ICD-10-CM

## 2011-09-20 DIAGNOSIS — K317 Polyp of stomach and duodenum: Secondary | ICD-10-CM

## 2011-09-20 DIAGNOSIS — D131 Benign neoplasm of stomach: Secondary | ICD-10-CM

## 2011-09-20 NOTE — Progress Notes (Signed)
Patient ID: Albert Simmers Sr., male   DOB: 1940/09/23, 71 y.o.   MRN: [id number]  Patient returns for followup. He has been seen in the past because of a history of adenomatous and fundic gland gastric polyps. He has a history of colon polyps as well.  He is considering bariatric surgery at [facility]. He has lost about 30 pounds and this can graduated. He has a followup with the bariatric surgeon, Dr. Lily Peer coming up next month.  He reports that he had an upper GI series there there was some question of aspiration. It does not sound like he's had a modified barium swallow. The patient does have some chronic hoarseness issues. He also has GERD.  He continues to have loose stools at times though it's usually the second stool the day. This is not really a new symptom.  Review of systems also notable for that he has developed an idiopathic peripheral neuropathy.  Allergies  Allergen Reactions  . Moxifloxacin     REACTION: Diarrhea   Outpatient Prescriptions Prior to Visit  Medication Sig Dispense Refill  . albuterol (PROVENTIL HFA;VENTOLIN HFA) 108 (90 BASE) MCG/ACT inhaler Inhale 2 puffs into the lungs every 6 (six) hours as needed.        Marland Kitchen aspirin 81 MG tablet Take 81 mg by mouth daily.        Marland Kitchen esomeprazole (NEXIUM) 40 MG capsule Take 40 mg by mouth daily before breakfast.        . fish oil-omega-3 fatty acids 1000 MG capsule Take 1 g by mouth 2 (two) times daily.        . Fluticasone-Salmeterol (ADVAIR) 100-50 MCG/DOSE AEPB Inhale 1 puff into the lungs every 12 (twelve) hours.        Marland Kitchen loratadine (CLARITIN) 10 MG tablet Take 10 mg by mouth daily.        . rosuvastatin (CRESTOR) 20 MG tablet Take 20 mg by mouth daily.        Marland Kitchen testosterone (ANDROGEL) 50 MG/5GM GEL Place 5 g onto the skin daily.        . zafirlukast (ACCOLATE) 20 MG tablet Take 20 mg by mouth 2 (two) times daily.         Past Medical History  Diagnosis Date  . Morbid obesity   . Hyperlipidemia   .  Esophageal reflux   . Hiatal hernia   . Other voice and resonance disorders   . Atrophic gastritis without mention of hemorrhage   . Unspecified asthma   . Obstructive sleep apnea on CPAP   . Gastric polyps     history  . Hx of adenomatous colonic polyps   . Metaplasia of esophagus 2011    "gastric metaplasia"  . Anal fissure   . Peripheral neuropathy    Past Surgical History  Procedure Date  . Incision and drainage perirectal abscess 2007  . Appendectomy   . Cholecystectomy   . Colonoscopy 08/20/2009 (multiple)    Tubular adenoma  . Esophagogastroduodenoscopy 08/20/09 (multiple)   History   Social History  . Marital Status: Married    Spouse Name: N/A    Number of Children: 3  .     Occupational History  . 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 Smoking status: Former Games developer  . Smokeless tobacco: Never Used  . Alcohol Use: Yes     moderate  . Drug Use: No  .  Family History  Problem Relation Age of Onset  . Stomach cancer Mother   . Cancer Sister     bladder  . Colon cancer Neg Hx       Assessment:  1. GERD (gastroesophageal reflux disease)   Stable - on Nexium  2. Morbid obesity   Improved   3. Hoarseness   Chronic - ? If could be related to possible aspiration  4. Loose stools - chronic  5. Gastric polyps - adenomatous and fundic gland polyps in past   Plan:  #1 I support the concept of bariatric surgery as therapy for him. He has lost weight with diet, that is typically not as long lasting and he has significant potential benefits with bariatric surgery..  #2 should he go forward with bariatric surgery, I think it would be reasonable to have a followup EGD sooner than 3 years from the last as planned. Essentially would most likely perform this preoperatively, if Dr. Lily Peer agrees. Patient may call back to arrange this pending his visit with Dr. Lily Peer  #3 he apparently has possible aspiration based on  the upper GI series. I will defer to Dr. Lily Peer for any further testing since he knows her results, and if it has any bearing upon possible surgery.  It sounds like a modified barium swallow would be reasonable.  Cc: Dr. Toni Amend [facility] - Surgery department), Dr. Adrian Prince

## 2011-09-20 NOTE — Patient Instructions (Signed)
Please call back and ask for Albert Barr to set up an Endoscopy if you decide to do that.  She will be happy to assist you.

## 2011-11-03 DIAGNOSIS — IMO0002 Reserved for concepts with insufficient information to code with codable children: Secondary | ICD-10-CM | POA: Insufficient documentation

## 2011-11-03 DIAGNOSIS — H40009 Preglaucoma, unspecified, unspecified eye: Secondary | ICD-10-CM | POA: Insufficient documentation

## 2011-11-03 DIAGNOSIS — H33329 Round hole, unspecified eye: Secondary | ICD-10-CM | POA: Insufficient documentation

## 2011-11-03 DIAGNOSIS — H43819 Vitreous degeneration, unspecified eye: Secondary | ICD-10-CM | POA: Insufficient documentation

## 2011-12-02 ENCOUNTER — Ambulatory Visit: Payer: BC Managed Care – PPO | Attending: Orthopedic Surgery | Admitting: Physical Therapy

## 2011-12-02 DIAGNOSIS — IMO0001 Reserved for inherently not codable concepts without codable children: Secondary | ICD-10-CM | POA: Insufficient documentation

## 2011-12-02 DIAGNOSIS — M25519 Pain in unspecified shoulder: Secondary | ICD-10-CM | POA: Insufficient documentation

## 2011-12-02 DIAGNOSIS — M2560 Stiffness of unspecified joint, not elsewhere classified: Secondary | ICD-10-CM | POA: Insufficient documentation

## 2011-12-07 ENCOUNTER — Ambulatory Visit: Payer: BC Managed Care – PPO | Attending: Endocrinology | Admitting: Physical Therapy

## 2011-12-07 DIAGNOSIS — R269 Unspecified abnormalities of gait and mobility: Secondary | ICD-10-CM | POA: Insufficient documentation

## 2011-12-07 DIAGNOSIS — IMO0001 Reserved for inherently not codable concepts without codable children: Secondary | ICD-10-CM | POA: Insufficient documentation

## 2011-12-09 ENCOUNTER — Ambulatory Visit: Payer: BC Managed Care – PPO | Admitting: Physical Therapy

## 2011-12-20 ENCOUNTER — Ambulatory Visit: Payer: BC Managed Care – PPO | Admitting: Physical Therapy

## 2011-12-23 ENCOUNTER — Ambulatory Visit: Payer: BC Managed Care – PPO | Admitting: Physical Therapy

## 2011-12-26 ENCOUNTER — Ambulatory Visit: Payer: BC Managed Care – PPO | Admitting: Physical Therapy

## 2011-12-28 ENCOUNTER — Ambulatory Visit: Payer: BC Managed Care – PPO | Attending: Orthopedic Surgery | Admitting: Physical Therapy

## 2011-12-28 DIAGNOSIS — M2560 Stiffness of unspecified joint, not elsewhere classified: Secondary | ICD-10-CM | POA: Insufficient documentation

## 2011-12-28 DIAGNOSIS — M25519 Pain in unspecified shoulder: Secondary | ICD-10-CM | POA: Insufficient documentation

## 2011-12-28 DIAGNOSIS — IMO0001 Reserved for inherently not codable concepts without codable children: Secondary | ICD-10-CM | POA: Insufficient documentation

## 2012-01-10 ENCOUNTER — Ambulatory Visit: Payer: BC Managed Care – PPO | Admitting: Physical Therapy

## 2012-01-12 ENCOUNTER — Ambulatory Visit: Payer: BC Managed Care – PPO | Admitting: Physical Therapy

## 2012-01-16 ENCOUNTER — Ambulatory Visit: Payer: BC Managed Care – PPO | Admitting: Physical Therapy

## 2012-02-08 ENCOUNTER — Ambulatory Visit: Payer: BC Managed Care – PPO | Attending: Orthopedic Surgery | Admitting: Physical Therapy

## 2012-02-08 DIAGNOSIS — M2560 Stiffness of unspecified joint, not elsewhere classified: Secondary | ICD-10-CM | POA: Diagnosis not present

## 2012-02-08 DIAGNOSIS — IMO0001 Reserved for inherently not codable concepts without codable children: Secondary | ICD-10-CM | POA: Insufficient documentation

## 2012-02-08 DIAGNOSIS — M25519 Pain in unspecified shoulder: Secondary | ICD-10-CM | POA: Insufficient documentation

## 2012-02-10 ENCOUNTER — Ambulatory Visit: Payer: BC Managed Care – PPO | Admitting: *Deleted

## 2012-06-14 DIAGNOSIS — M792 Neuralgia and neuritis, unspecified: Secondary | ICD-10-CM | POA: Insufficient documentation

## 2012-06-14 DIAGNOSIS — R2689 Other abnormalities of gait and mobility: Secondary | ICD-10-CM | POA: Insufficient documentation

## 2012-06-14 DIAGNOSIS — G629 Polyneuropathy, unspecified: Secondary | ICD-10-CM | POA: Insufficient documentation

## 2012-08-13 ENCOUNTER — Encounter: Payer: Self-pay | Admitting: Internal Medicine

## 2012-10-25 ENCOUNTER — Other Ambulatory Visit: Payer: Self-pay | Admitting: Dermatology

## 2013-03-18 ENCOUNTER — Encounter: Payer: Self-pay | Admitting: Internal Medicine

## 2013-05-16 ENCOUNTER — Ambulatory Visit (INDEPENDENT_AMBULATORY_CARE_PROVIDER_SITE_OTHER): Payer: Medicare Other | Admitting: Internal Medicine

## 2013-05-16 ENCOUNTER — Encounter: Payer: Self-pay | Admitting: Internal Medicine

## 2013-05-16 VITALS — BP 124/70 | HR 64 | Ht 69.0 in | Wt 297.2 lb

## 2013-05-16 DIAGNOSIS — K2940 Chronic atrophic gastritis without bleeding: Secondary | ICD-10-CM

## 2013-05-16 DIAGNOSIS — D131 Benign neoplasm of stomach: Secondary | ICD-10-CM

## 2013-05-16 DIAGNOSIS — Z800 Family history of malignant neoplasm of digestive organs: Secondary | ICD-10-CM | POA: Insufficient documentation

## 2013-05-16 DIAGNOSIS — Z8719 Personal history of other diseases of the digestive system: Secondary | ICD-10-CM

## 2013-05-16 NOTE — Progress Notes (Signed)
  Subjective:    Patient ID: Albert Simmers Sr., male    DOB: 07/15/1940, 72 y.o.   MRN: [id number]  HPI Doing well without GI c/o. Wt Readings from Last 3 Encounters:  05/16/13 297 lb 4 oz (134.832 kg)  09/20/11 283 lb (128.368 kg)  06/09/11 293 lb (132.904 kg)  Medications, allergies, past medical history, past surgical history, family history and social history are reviewed and updated in the EMR.  Review of Systems Hairline tibial fracture RLE - 3 weeks ago    Objective:   Physical Exam General:  NAD, obese Eyes:   anicteric Lungs:  clear Heart:  S1S2 no rubs, murmurs or gallops  Data Reviewed:   2011 EGD and pathology    Assessment & Plan:  Benign neoplasm of stomach  Family history of gastric cancer

## 2013-05-16 NOTE — Patient Instructions (Signed)
You have been scheduled for an endoscopy with propofol. Please follow written instructions given to you at your visit today. If you use inhalers (even only as needed), please bring them with you on the day of your procedure. Your physician has requested that you go to www.startemmi.com and enter the access code given to you at your visit today. This web site gives a general overview about your procedure. However, you should still follow specific instructions given to you by our office regarding your preparation for the procedure.  I appreciate the opportunity to care for you.  

## 2013-05-16 NOTE — Assessment & Plan Note (Signed)
F/u EGD given polyps and FHx gastric ca

## 2013-05-16 NOTE — Assessment & Plan Note (Addendum)
Schedule EGD to evaluate for more neoplastic polyps and because of family hx gastric cancer The risks and benefits as well as alternatives of endoscopic procedure(s) have been discussed and reviewed. All questions answered. The patient agrees to proceed.

## 2013-05-17 ENCOUNTER — Encounter: Payer: Self-pay | Admitting: Internal Medicine

## 2013-07-10 ENCOUNTER — Other Ambulatory Visit: Payer: Self-pay | Admitting: Orthopedic Surgery

## 2013-07-10 DIAGNOSIS — M549 Dorsalgia, unspecified: Secondary | ICD-10-CM

## 2013-07-12 ENCOUNTER — Telehealth: Payer: Self-pay | Admitting: Internal Medicine

## 2013-07-12 NOTE — Telephone Encounter (Signed)
Spoke with Manuela Schwartz patient's wife and gave her instructions for EGD.

## 2013-07-15 ENCOUNTER — Encounter: Payer: Self-pay | Admitting: Internal Medicine

## 2013-07-15 ENCOUNTER — Ambulatory Visit (AMBULATORY_SURGERY_CENTER): Payer: Medicare Other | Admitting: Internal Medicine

## 2013-07-15 VITALS — BP 116/71 | HR 57 | Temp 97.2°F | Resp 19 | Ht 69.0 in | Wt 297.0 lb

## 2013-07-15 DIAGNOSIS — D131 Benign neoplasm of stomach: Secondary | ICD-10-CM

## 2013-07-15 DIAGNOSIS — Z800 Family history of malignant neoplasm of digestive organs: Secondary | ICD-10-CM

## 2013-07-15 MED ORDER — SODIUM CHLORIDE 0.9 % IV SOLN: 500.0000 mL | INTRAVENOUS | Status: DC

## 2013-07-15 NOTE — Progress Notes (Signed)
Called to room to assist during endoscopic procedure.  Patient ID and intended procedure confirmed with present staff. Received instructions for my participation in the procedure from the performing physician.  

## 2013-07-15 NOTE — Progress Notes (Signed)
A/ox3 pleased with MAC, report to Annette RN 

## 2013-07-15 NOTE — Patient Instructions (Addendum)
You have several small polyps in the stomach as before. They do not look concerning but I took biopsies as usual. Everything else looked ok. I will send a letter to you re: polyp biopsies and plans.  I appreciate the opportunity to care for you. Gatha Mayer, MD, FACG     YOU HAD AN ENDOSCOPIC PROCEDURE TODAY AT [facility]: Refer to the procedure report that was given to you for any specific questions about what was found during the examination.  If the procedure report does not answer your questions, please call your gastroenterologist to clarify.  If you requested that your care partner not be given the details of your procedure findings, then the procedure report has been included in a sealed envelope for you to review at your convenience later.  YOU SHOULD EXPECT: Some feelings of bloating in the abdomen. Passage of more gas than usual.  Walking can help get rid of the air that was put into your GI tract during the procedure and reduce the bloating. If you had a lower endoscopy (such as a colonoscopy or flexible sigmoidoscopy) you may notice spotting of blood in your stool or on the toilet paper. If you underwent a bowel prep for your procedure, then you may not have a normal bowel movement for a few days.  DIET: Your first meal following the procedure should be a light meal and then it is ok to progress to your normal diet.  A half-sandwich or bowl of soup is an example of a good first meal.  Heavy or fried foods are harder to digest and may make you feel nauseous or bloated.  Likewise meals heavy in dairy and vegetables can cause extra gas to form and this can also increase the bloating.  Drink plenty of fluids but you should avoid alcoholic beverages for 24 hours.  ACTIVITY: Your care partner should take you home directly after the procedure.  You should plan to take it easy, moving slowly for the rest of the day.  You can resume normal activity the day after the procedure  however you should NOT DRIVE or use heavy machinery for 24 hours (because of the sedation medicines used during the test).    SYMPTOMS TO REPORT IMMEDIATELY: A gastroenterologist can be reached at any hour.  During normal business hours, 8:30 AM to 5:00 PM Monday through Friday, call [phone].  After hours and on weekends, please call the GI answering service at [phone] who will take a message and have the physician on call contact you.     Following upper endoscopy (EGD)  Vomiting of blood or coffee ground material  New chest pain or pain under the shoulder blades  Painful or persistently difficult swallowing  New shortness of breath  Fever of 100F or higher  Black, tarry-looking stools  FOLLOW UP: If any biopsies were taken you will be contacted by phone or by letter within the next 1-3 weeks.  Call your gastroenterologist if you have not heard about the biopsies in 3 weeks.  Our staff will call the home number listed on your records the next business day following your procedure to check on you and address any questions or concerns that you may have at that time regarding the information given to you following your procedure. This is a courtesy call and so if there is no answer at the home number and we have not heard from you through the emergency physician on call, we will  assume that you have returned to your regular daily activities without incident.  SIGNATURES/CONFIDENTIALITY: You and/or your care partner have signed paperwork which will be entered into your electronic medical record.  These signatures attest to the fact that that the information above on your After Visit Summary has been reviewed and is understood.  Full responsibility of the confidentiality of this discharge information lies with you and/or your care-partner.    You may resume your current medications today. Please call if any questions or concerns.

## 2013-07-15 NOTE — Op Note (Signed)
[address]  [address] [place], [id number]   ENDOSCOPY PROCEDURE REPORT  PATIENT: Albert Barr, Albert  Barr.  MR#: [id number] BIRTHDATE: 08/19/1940 , 72  yrs. old GENDER: Male ENDOSCOPIST: Gatha Mayer, MD, [facility] PROCEDURE DATE:  07/15/2013 PROCEDURE:  EGD w/ biopsy ASA CLASS:     Class III INDICATIONS:  Surveillance.  gastric polyps and family hx gastric cancer (mother) MEDICATIONS: propofol (Diprivan) 150mg  IV, MAC sedation, administered by CRNA, and These medications were titrated to patient response per physician's verbal order TOPICAL ANESTHETIC: none  DESCRIPTION OF PROCEDURE: After the risks benefits and alternatives of the procedure were thoroughly explained, informed consent was obtained.  The LB [id number] endoscope was introduced through the mouth and advanced to the second portion of the duodenum. Without limitations.  The instrument was slowly withdrawn as the mucosa was fully examined.      STOMACH: Multiple small  (subcentimeter) sessile polypswere found in the gastric fundus and gastric body.  Multiple biopsies was performed using cold forceps.  Sample sent for histology.   the body polyps were rounded and erythematous and the fundus polyps flat and white. Not > 15 polyps total by estimate.  The remainder of the upper endoscopy exam was otherwise normal. Retroflexed views revealed fundic polyps.     The scope was then withdrawn from the patient and the procedure completed.  COMPLICATIONS: There were no complications. ENDOSCOPIC IMPRESSION: 1.   Multiple small sessile polyps were found in the gastric fundus and gastric body - biopsies taken 2.   The remainder of the upper endoscopy exam was otherwise normal  RECOMMENDATIONS: Await pathology results   eSigned:  Gatha Mayer, MD, [facility] 07/15/2013 3:52 PM  CC:The Patient  and Reynold Bowen, MD

## 2013-07-15 NOTE — Progress Notes (Signed)
No complaints noted in the recovery room. Maw   

## 2013-07-16 ENCOUNTER — Telehealth: Payer: Self-pay | Admitting: *Deleted

## 2013-07-16 NOTE — Telephone Encounter (Signed)
  Follow up Call-  Call back number 07/15/2013  Post procedure Call Back phone  # [phone]  Permission to leave phone message Yes     Patient questions:  Do you have a fever, pain , or abdominal swelling? Yes Pain Score  4     *  Have you tolerated food without any problems? yes  Have you been able to return to your normal activities? yes  Do you have any questions about your discharge instructions: Diet   no Medications  no Follow up visit  no  Do you have questions or concerns about your Care? no  Actions: * If pain score is 4 or above: No action needed, pain <4.  Pt c/o pain under jaw, pt states it was really sore last night and he tried to call the after hours number but sat on hold for over 30 mins and then he hung up, pt states jaw is better this morning but still a little sore, tender to touch, explained to pt that sometimes they perform a jaw thrust to help hold the airway open and this could be the cause of his discomfort,pt also c/o his nose running and states he had a sneezing attack, advised pt that tenderness should get better but to call us back if it starts to bother him or if pain increases more and running nose could be from oxygen pt received during his procedure, pt states understanding to call back-adm

## 2013-07-19 ENCOUNTER — Encounter: Payer: Self-pay | Admitting: Internal Medicine

## 2013-07-19 NOTE — Progress Notes (Signed)
Quick Note:  Fundic gland and hyperplastic gastric polyps Repeat endoscopy 2-3 years ______

## 2013-07-24 ENCOUNTER — Telehealth: Payer: Self-pay | Admitting: Internal Medicine

## 2013-07-25 NOTE — Telephone Encounter (Signed)
Doubt related to procedure Would ask him to have PCP address if persistent sxs

## 2013-07-25 NOTE — Telephone Encounter (Signed)
Patient reports that he has had runny nose and sneezing episodes.  This has happened 2 x since the endoscopy.  He is still having some drainage from his left nostril.  He does not have any other symptoms.  Dr. Carlean Purl please advise

## 2013-07-25 NOTE — Telephone Encounter (Signed)
Left message for patient to call back  

## 2013-07-25 NOTE — Telephone Encounter (Signed)
Patient notified

## 2013-08-02 ENCOUNTER — Other Ambulatory Visit: Payer: Medicare Other

## 2013-11-27 DIAGNOSIS — M4310 Spondylolisthesis, site unspecified: Secondary | ICD-10-CM | POA: Insufficient documentation

## 2014-02-12 DIAGNOSIS — H04129 Dry eye syndrome of unspecified lacrimal gland: Secondary | ICD-10-CM | POA: Insufficient documentation

## 2014-02-12 DIAGNOSIS — H029 Unspecified disorder of eyelid: Secondary | ICD-10-CM | POA: Insufficient documentation

## 2014-02-12 DIAGNOSIS — H0259 Other disorders affecting eyelid function: Secondary | ICD-10-CM | POA: Insufficient documentation

## 2014-02-12 DIAGNOSIS — H11829 Conjunctivochalasis, unspecified eye: Secondary | ICD-10-CM | POA: Insufficient documentation

## 2014-03-12 ENCOUNTER — Encounter: Payer: Self-pay | Admitting: Internal Medicine

## 2014-03-12 ENCOUNTER — Encounter: Payer: Self-pay | Admitting: Gastroenterology

## 2014-07-22 ENCOUNTER — Ambulatory Visit (INDEPENDENT_AMBULATORY_CARE_PROVIDER_SITE_OTHER): Payer: 59 | Admitting: Sports Medicine

## 2014-07-22 ENCOUNTER — Encounter: Payer: Self-pay | Admitting: Sports Medicine

## 2014-07-22 VITALS — BP 144/81 | HR 79 | Ht 69.0 in | Wt 300.0 lb

## 2014-07-22 DIAGNOSIS — M25511 Pain in right shoulder: Secondary | ICD-10-CM

## 2014-07-22 DIAGNOSIS — M75111 Incomplete rotator cuff tear or rupture of right shoulder, not specified as traumatic: Secondary | ICD-10-CM

## 2014-07-22 DIAGNOSIS — M75110 Incomplete rotator cuff tear or rupture of unspecified shoulder, not specified as traumatic: Secondary | ICD-10-CM | POA: Insufficient documentation

## 2014-07-22 MED ORDER — NITROGLYCERIN 0.2 MG/HR TD PT24: MEDICATED_PATCH | TRANSDERMAL | Status: DC

## 2014-07-22 MED ORDER — TRAMADOL HCL 50 MG PO TABS: 50.0000 mg | ORAL_TABLET | Freq: Four times a day (QID) | ORAL | Status: DC | PRN

## 2014-07-22 NOTE — Assessment & Plan Note (Signed)
Ultrasound demonstrated a partial tear of your Rotator cuff (supraspinatus muscle is partially retracted).   Plan: 1) Start NG protocol 2) Start tramadol 1 - 2 times daily to help with pain (one in morning and one with supper) 3) Perform shoulder ROM exercises:   - Shoulder flexion at neutral and 45 degrees  - External and internal rotation of shoulder  - Perform 2 sets of 10 - 15 several times a day. Can use light dumbbell for added resistance.  4) Follow up in 6 weeks for a repeat scan

## 2014-07-22 NOTE — Patient Instructions (Signed)
You have a partial tear of your Rotator cuff (supraspinatus muscle).  Nitroglycerin Protocol   Apply 1/4 nitroglycerin patch to affected area daily.  Change position of patch within the affected area every 24 hours.  You may experience a headache during the first 1-2 weeks of using the patch, these should subside.  If you experience headaches after beginning nitroglycerin patch treatment, you may take your preferred over the counter pain reliever.  Another side effect of the nitroglycerin patch is skin irritation or rash related to patch adhesive.  Please notify our office if you develop more severe headaches or rash, and stop the patch.  Tendon healing with nitroglycerin patch may require 12 to 24 weeks depending on the extent of injury.  Men should not use if taking Viagra, Cialis, or Levitra.   Do not use if you have migraines or rosacea.   Take tramadol 1 - 2 x a day if it helps your pain (take one in the morning and one at supper)  Try moving your shoulder several times a day: 1) Move your arm out in front of you (limit it to parallel), then take your arm out to 45 degree go to parallel. Do not try movements overhead.  2) holding your arm at 90 at your side, move your into your stomach and then out  You can use light dumbbells (1 - 3 lbs). Your pain level should be mild. If you develop moderate to severe pain, you should rest.  Perform 2 sets of 10 - 15 reps several times throughout the time if you can.

## 2014-07-22 NOTE — Progress Notes (Signed)
   Subjective:    Patient ID: Albert Presume Sr., male    DOB: Nov 23, 1940, 74 y.o.   MRN: [id number]  HPI Albert Barr is a 74 year old male with PMH of obesity, sleep apnea and asthma who presents with a 4 month history of R shoulder pain. His right shoulder pain started gradually four months ago. He denies any trauma or injury to his shoulder. The pain is located on the anterior and superior aspect of his shoulder. The pain is sharp. He denies any radiation of the pain down his arm. He endorses pain with lifting his arm up, bringing his hand behind his head and putting his hand into his pocket. He has been taking ibuprofen or aleve every night without relief of his pain. The pain keeps him awake at night. He denies any recent use of steroids and does not believe he has recently taken ciproflaxacin or Levaquin. Does take Crestor.  Of note, Albert Barr has had chronic shoulder pain in his left shoulder due to arthritis. He has had x-rays of his left shoulder which showed, according to the patient, degenerative changes. He did physical therapy for his shoulder with some relief. The pain in his R shoulder is more painful than his left and also seems "more muscular."  Review of Systems     Objective:   Physical Exam Well developed and obeset. No acute distress.  BP 144/81 mmHg  Pulse 79  Ht 5\' 9"  (1.753 m)  Wt 300 lb (136.079 kg)  BMI 44.28 kg/m2  Right shoulder: No atrophy noted. No scapula winging. Non-tender to palpation over [facility] joint. Flexion ROM limited to neutral only (90 degrees). Abduction ROM limited to 75 degrees. Shoulder extension to lower lumbar region. 4/5 strength with shoulder abduction. Positive empty can and Hawkins' sign. Negative Yergason's sign. Neg Speed.  Left Shoulder: No atrophy noted. Non-tender to palpation. Full shoulder range of motion. 5/5 shoulder abduction. Negative empty can and Hawkins'   Neck: Non-tender to palpation. Extension limited to 10 degrees. Flexion normal  to 45 degrees. Lateral flexion limited to 30 degrees bilaterally.    Ultrasound: Complete ultrasound of R shoulder demonstrated partial retraction of right supraspinatus tendon.  It appears on longitudinal and interval view that the articular half of the tendon is absent and I suspect this is a 50% thickness RC tear with retraction.  The infraspinatus, teres minor and subscapularis muscles are intact. The [facility] joint had mild calcific changes and effusion.       Assessment & Plan:   Note written originally by [place]. Note reviewed/edited by Albert Barr.

## 2014-08-07 ENCOUNTER — Ambulatory Visit: Payer: TRICARE For Life (TFL) | Admitting: Sports Medicine

## 2014-08-11 ENCOUNTER — Encounter: Payer: Self-pay | Admitting: Internal Medicine

## 2014-08-18 ENCOUNTER — Other Ambulatory Visit: Payer: Self-pay | Admitting: *Deleted

## 2014-08-18 MED ORDER — NITROGLYCERIN 0.2 MG/HR TD PT24: MEDICATED_PATCH | TRANSDERMAL | Status: DC

## 2014-08-19 DIAGNOSIS — L718 Other rosacea: Secondary | ICD-10-CM | POA: Insufficient documentation

## 2014-09-03 ENCOUNTER — Encounter: Payer: Self-pay | Admitting: Sports Medicine

## 2014-09-03 ENCOUNTER — Ambulatory Visit (INDEPENDENT_AMBULATORY_CARE_PROVIDER_SITE_OTHER): Payer: 59 | Admitting: Sports Medicine

## 2014-09-03 VITALS — BP 130/76 | Ht 69.0 in | Wt 300.0 lb

## 2014-09-03 DIAGNOSIS — M4726 Other spondylosis with radiculopathy, lumbar region: Secondary | ICD-10-CM | POA: Diagnosis not present

## 2014-09-03 DIAGNOSIS — M47817 Spondylosis without myelopathy or radiculopathy, lumbosacral region: Secondary | ICD-10-CM | POA: Insufficient documentation

## 2014-09-03 DIAGNOSIS — G629 Polyneuropathy, unspecified: Secondary | ICD-10-CM | POA: Diagnosis not present

## 2014-09-03 DIAGNOSIS — M47816 Spondylosis without myelopathy or radiculopathy, lumbar region: Secondary | ICD-10-CM | POA: Insufficient documentation

## 2014-09-03 DIAGNOSIS — M75111 Incomplete rotator cuff tear or rupture of right shoulder, not specified as traumatic: Secondary | ICD-10-CM | POA: Diagnosis not present

## 2014-09-03 NOTE — Progress Notes (Signed)
   Subjective:    Patient ID: Albert Presume Sr., male    DOB: [birth year], 74 y.o.   MRN: [id number]  HPI Albert Barr is a 74 year old male with PMH of obesity, sleep apnea and asthma who is here for f/u for R shoulder pain. His right shoulder pain started gradually five months ago. Since last visit he has been doing nitroglycerin patches, using currently 1/4 patches, taking tramadol for pain, and participating in home exercise program.  He states that since his pain started he is now approx 50% improving.  He is still avoiding overhead activity with his R arm.  Of note, he also discusses bilateral lower extremity neuropathy today.  He has been following with a neurologist and has had a full workup for his neuropathy with no significant findings.     Review of Systems Negative apart from HPI    Objective:   Physical Exam Well developed and obeset. No acute distress.  BP 130/76 mmHg  Ht 5\' 9"  (1.753 m)  Wt 300 lb (136.079 kg)  BMI 44.28 kg/m2  Right shoulder: No atrophy noted. No scapula winging. Mild TTP over AC joint. Forward flexion to 160 degrees.  Back scratch to lower lumbar region but bilateral limitation.   5/5 strength with shoulder abduction but mild pain. Negative Hawkin's test. Negative Yergason's sign. Neg Speed.  He can feel some slight pain on testing.  Note empty can strong now.  Left Shoulder: No atrophy noted. Non-tender to palpation. Full shoulder range of motion. 5/5 shoulder abduction. Negative empty can and Luan Pulling'   Ultrasound: Complete ultrasound of R shoulder demonstrated improved supraspinatus tendon.  Tendon appears relatively intact on longitudinal and interval view  The infraspinatus, teres minor and subscapularis muscles are intact. The [facility] joint had mild calcific changes.  Biceps tendon intact with a small amount of surrounding fluid.  Outside spine XR: 25% anterior translation of L5     Assessment & Plan:   1. Improving R shoulder pain 2/2 healing partial  supraspinatus tear - will continue NG 1/4 patch - continue home exercise program with expansion to include abduction and forward flexion > 90 degrees - continue tramadol for pain  2. 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

## 2014-09-03 NOTE — Assessment & Plan Note (Signed)
This is much improved  We will cont NTG protocol  Add more HEP Reck 2 months

## 2014-09-03 NOTE — Assessment & Plan Note (Signed)
Suggested a series of abdominal exercises  Avoid back extension

## 2014-09-29 ENCOUNTER — Encounter: Payer: Self-pay | Admitting: Internal Medicine

## 2014-10-16 ENCOUNTER — Ambulatory Visit: Payer: TRICARE For Life (TFL) | Admitting: Sports Medicine

## 2014-11-14 ENCOUNTER — Ambulatory Visit (AMBULATORY_SURGERY_CENTER): Payer: Self-pay | Admitting: *Deleted

## 2014-11-14 VITALS — Ht 70.0 in | Wt 292.0 lb

## 2014-11-14 DIAGNOSIS — Z86010 Personal history of colonic polyps: Secondary | ICD-10-CM

## 2014-11-14 NOTE — Progress Notes (Signed)
No egg or soy allergy No issues with past sedation No diet pills No home 02 use emmi video declined

## 2014-11-19 ENCOUNTER — Encounter: Payer: Self-pay | Admitting: Sports Medicine

## 2014-11-19 ENCOUNTER — Ambulatory Visit (INDEPENDENT_AMBULATORY_CARE_PROVIDER_SITE_OTHER): Payer: Medicare Other | Admitting: Sports Medicine

## 2014-11-19 VITALS — BP 140/73 | Ht 69.0 in | Wt 300.0 lb

## 2014-11-19 DIAGNOSIS — M4726 Other spondylosis with radiculopathy, lumbar region: Secondary | ICD-10-CM

## 2014-11-19 DIAGNOSIS — M75111 Incomplete rotator cuff tear or rupture of right shoulder, not specified as traumatic: Secondary | ICD-10-CM | POA: Diagnosis not present

## 2014-11-19 MED ORDER — NITROGLYCERIN 0.2 MG/HR TD PT24: MEDICATED_PATCH | TRANSDERMAL | Status: DC

## 2014-11-19 MED ORDER — NITROGLYCERIN 0.2 MG/HR TD PT24: 0.2000 mg | MEDICATED_PATCH | Freq: Every day | TRANSDERMAL | Status: DC

## 2014-11-19 NOTE — Assessment & Plan Note (Signed)
I recommended cont some flexion exercises  Practice 1 foot balance  Meds as needed for pain

## 2014-11-19 NOTE — Progress Notes (Signed)
Patient ID: Albert Barr., male   DOB: 12-04-1940, 74 y.o.   MRN: [id number]  RT RC has improved Not using much tramadol NTG has really helped Able to do all norm activities  He does get some periodic aching on RT Actually worse on left which has known DJD  Has peripheral neuropathy This affects balance He had a recent fall onto LT knee  ? Of whether his L5/Si spondylolithesis contributes to nerve irritation Trial of flexion exercises not much help yet  Exam NAD/ pleasant/ morbidly obese BP 140/73 mmHg  Ht 5\' 9"  (1.753 m)  Wt 300 lb (136.079 kg)  BMI 44.28 kg/m2  RT shoulder Full RC function/ no weakness No impingement signs Had some limitation to full elevation but more than left Mild limitaiton of back scratch IR/ER normal  LT shouler Greater limitation of elevation and of back scratch  Review of Lt shoulder films shoe humeral irregularity and spurring/ moderate DJD  Balance difficult/ 1 foot balance is unstable Lumbar flexion is pain free  Review of MRI shows G1 spondy L5/S1 but also DDD of this level with bulging disk and some nerve impingement

## 2014-11-19 NOTE — Assessment & Plan Note (Signed)
RT rotator cuff has healed  I think he has some AC joint DJD and perhaps mild GH DJD  Cont to work on ROM of both shoulders  Tramadol prn  Try to wean off NTG unless needed  Reck 3 mos

## 2014-11-19 NOTE — Patient Instructions (Signed)
Shoulder is much better  Don't hesitate to use some tramadol for arthritis pain if needed  For shoulders light weight is better 2 to 3 sets of a comfortable number of repetitions  Rather than adding more weight I would suggest working on motion Wall climbs Stretching behind you  For your back Abdominal flexion in chair Standing forward bend to RT/ center and Left Crunches are good  For balance 1 foot stand with eyes closed (have something to hold on to) 1 foot stand with ball bounces off wall 1 foot stand with forward bend  Try these things and see me as needed

## 2014-11-26 ENCOUNTER — Encounter: Payer: Self-pay | Admitting: Internal Medicine

## 2014-11-26 ENCOUNTER — Ambulatory Visit (AMBULATORY_SURGERY_CENTER): Payer: Medicare Other | Admitting: Internal Medicine

## 2014-11-26 VITALS — BP 108/62 | HR 55 | Temp 96.8°F | Resp 45 | Ht 70.0 in | Wt 292.0 lb

## 2014-11-26 DIAGNOSIS — Z86010 Personal history of colonic polyps: Secondary | ICD-10-CM | POA: Diagnosis not present

## 2014-11-26 DIAGNOSIS — K621 Rectal polyp: Secondary | ICD-10-CM

## 2014-11-26 DIAGNOSIS — D123 Benign neoplasm of transverse colon: Secondary | ICD-10-CM

## 2014-11-26 DIAGNOSIS — D124 Benign neoplasm of descending colon: Secondary | ICD-10-CM | POA: Diagnosis not present

## 2014-11-26 DIAGNOSIS — D122 Benign neoplasm of ascending colon: Secondary | ICD-10-CM

## 2014-11-26 MED ORDER — SODIUM CHLORIDE 0.9 % IV SOLN: 500.0000 mL | INTRAVENOUS | Status: DC

## 2014-11-26 NOTE — Op Note (Signed)
[facility]  [address]. [place], [id number]   COLONOSCOPY PROCEDURE REPORT  PATIENT: Albert, Barr  MR#: [id number] BIRTHDATE: 19-Dec-1940 , 73  yrs. old GENDER: male ENDOSCOPIST: Gatha Mayer, MD, [facility] PROCEDURE DATE:  11/26/2014 PROCEDURE:   Colonoscopy, surveillance , Colonoscopy with biopsy, and Colonoscopy with snare polypectomy First Screening Colonoscopy - Avg.  risk and is 50 yrs.  old or older - No.  Prior Negative Screening - Now for repeat screening. N/A  History of Adenoma - Now for follow-up colonoscopy & has been > or = to 3 yrs.  Yes hx of adenoma.  Has been 3 or more years since last colonoscopy.  Polyps removed today? Yes ASA CLASS:   Class III INDICATIONS:Surveillance due to prior colonic neoplasia and PH Colon Adenoma. MEDICATIONS: Propofol 400 mg IV and Monitored anesthesia care  DESCRIPTION OF PROCEDURE:   After the risks benefits and alternatives of the procedure were thoroughly explained, informed consent was obtained.  The digital rectal exam revealed no abnormalities of the rectum, revealed no prostatic nodules, and revealed the prostate was not enlarged.   The LB [id number] endoscope was introduced through the anus and advanced to the cecum, which was identified by both the appendix and ileocecal valve. No adverse events experienced.   The quality of the prep was (MiraLax was used) excellent.  The instrument was then slowly withdrawn as the colon was fully examined. Estimated blood loss is zero unless otherwise noted in this procedure report.      COLON FINDINGS: Multiple sessile polyps ranging from 2 to 54mm in size were found in the transverse colon, ascending colon, rectum, and descending colon.  Polypectomies were performed using snare cautery (15 mm distal transverse), with a cold snare (ascending, transverse, descending) and with cold forceps (1 ascending, 1 descending and 2 rectal).  The resection was complete,  the polyp tissue was completely retrieved and sent to histology.   The examination was otherwise normal.  Retroflexed views revealed no abnormalities. The time to cecum = 4.3 Withdrawal time = 30.4   The scope was withdrawn and the procedure completed. COMPLICATIONS: There were no immediate complications.  ENDOSCOPIC IMPRESSION: 1.   Multiple sessile polyps ranging from 2 to 75mm in size were found in the transverse colon, ascending colon, rectum, and descending colon; polypectomies were performed using snare cautery, with a cold snare and with cold forceps 2.   The examination was otherwise normal  RECOMMENDATIONS: 1.  Hold Aspirin and all other NSAIDS for 2 weeks. 2.  Timing of repeat colonoscopy will be determined by pathology findings.  eSigned:  Gatha Mayer, MD, [facility] 11/26/2014 12:02 PM   cc: Carrolyn Meiers, MD and The patient   PATIENT NAME:  Albert, Barr MR#: [id number]

## 2014-11-26 NOTE — Progress Notes (Signed)
Called to room to assist during endoscopic procedure.  Patient ID and intended procedure confirmed with present staff. Received instructions for my participation in the procedure from the performing physician.  

## 2014-11-26 NOTE — Progress Notes (Signed)
Patient awakening,vss,report to rn 

## 2014-11-26 NOTE — Patient Instructions (Addendum)
I found and removed 13 polyps today. All look benign but will be examined by a pathologist.  I will let you know pathology results and when to have another routine colonoscopy by mail.  I appreciate the opportunity to care for you. Gatha Mayer, MD, FACG  YOU HAD AN ENDOSCOPIC PROCEDURE TODAY AT [facility]:   Refer to the procedure report that was given to you for any specific questions about what was found during the examination.  If the procedure report does not answer your questions, please call your gastroenterologist to clarify.  If you requested that your care partner not be given the details of your procedure findings, then the procedure report has been included in a sealed envelope for you to review at your convenience later.  YOU SHOULD EXPECT: Some feelings of bloating in the abdomen. Passage of more gas than usual.  Walking can help get rid of the air that was put into your GI tract during the procedure and reduce the bloating. If you had a lower endoscopy (such as a colonoscopy or flexible sigmoidoscopy) you may notice spotting of blood in your stool or on the toilet paper. If you underwent a bowel prep for your procedure, you may not have a normal bowel movement for a few days.  Please Note:  You might notice some irritation and congestion in your nose or some drainage.  This is from the oxygen used during your procedure.  There is no need for concern and it should clear up in a day or so.  SYMPTOMS TO REPORT IMMEDIATELY:   Following lower endoscopy (colonoscopy or flexible sigmoidoscopy):  Excessive amounts of blood in the stool  Significant tenderness or worsening of abdominal pains  Swelling of the abdomen that is new, acute  Fever of 100F or higher   For urgent or emergent issues, a gastroenterologist can be reached at any hour by calling [phone].   DIET: Your first meal following the procedure should be a small meal and then it is ok  to progress to your normal diet. Heavy or fried foods are harder to digest and may make you feel nauseous or bloated.  Likewise, meals heavy in dairy and vegetables can increase bloating.  Drink plenty of fluids but you should avoid alcoholic beverages for 24 hours.  ACTIVITY:  You should plan to take it easy for the rest of today and you should NOT DRIVE or use heavy machinery until tomorrow (because of the sedation medicines used during the test).    FOLLOW UP: Our staff will call the number listed on your records the next business day following your procedure to check on you and address any questions or concerns that you may have regarding the information given to you following your procedure. If we do not reach you, we will leave a message.  However, if you are feeling well and you are not experiencing any problems, there is no need to return our call.  We will assume that you have returned to your regular daily activities without incident.  If any biopsies were taken you will be contacted by phone or by letter within the next 1-3 weeks.  Please call us at [phone] if you have not heard about the biopsies in 3 weeks.    SIGNATURES/CONFIDENTIALITY: You and/or your care partner have signed paperwork which will be entered into your electronic medical record.  These signatures attest to the fact that that the information above on your  After Visit Summary has been reviewed and is understood.  Full responsibility of the confidentiality of this discharge information lies with you and/or your care-partner.  Polyp information given.  No non-steroidal anti-inflammatory meds for 2 weeks.

## 2014-11-27 ENCOUNTER — Telehealth: Payer: Self-pay | Admitting: *Deleted

## 2014-11-27 NOTE — Telephone Encounter (Signed)
  Follow up Call-  Call back number 11/26/2014 07/15/2013  Post procedure Call Back phone  # [phone]  Permission to leave phone message Yes Yes     Patient questions:  Message left to call us if necessary.

## 2014-12-03 ENCOUNTER — Encounter: Payer: Self-pay | Admitting: Internal Medicine

## 2014-12-03 DIAGNOSIS — Z86010 Personal history of colonic polyps: Secondary | ICD-10-CM

## 2014-12-03 NOTE — Progress Notes (Signed)
Quick Note:  10 adenomas max 118 mm - repeat colon 1 year 2017 ______

## 2014-12-04 ENCOUNTER — Other Ambulatory Visit: Payer: Self-pay | Admitting: Sports Medicine

## 2015-01-12 ENCOUNTER — Other Ambulatory Visit: Payer: Self-pay | Admitting: Sports Medicine

## 2015-03-10 ENCOUNTER — Ambulatory Visit (INDEPENDENT_AMBULATORY_CARE_PROVIDER_SITE_OTHER): Payer: Medicare Other | Admitting: Sports Medicine

## 2015-03-10 ENCOUNTER — Encounter: Payer: Self-pay | Admitting: Sports Medicine

## 2015-03-10 VITALS — BP 132/64 | HR 71 | Ht 70.0 in | Wt 290.0 lb

## 2015-03-10 DIAGNOSIS — M75111 Incomplete rotator cuff tear or rupture of right shoulder, not specified as traumatic: Secondary | ICD-10-CM | POA: Diagnosis not present

## 2015-03-10 NOTE — Patient Instructions (Signed)
You have bicipital tendonitis  Two exercises :  1. Biceps curls  2. Forearm rolls  Do 3 sets of 15 a few times a day with 1 to 3 lbs.  Start back using 1/4 patch of Nitroglycerine over the tendon If not improving in 1 week - buy some arnica gel and rub that in 3 times a day  If not better in 1 month I need to repeat your ultrasound

## 2015-03-10 NOTE — Assessment & Plan Note (Signed)
Current problems seem related to bicipital tendonitis  RC problems seem less  We will restart NTG Start biceps exercises If this pain persists we will need to repeat scan Try this for 4 weeks and REC if not improving

## 2015-03-10 NOTE — Progress Notes (Signed)
Patient ID: Albert Presume Sr., male   DOB: Jan 09, 1941, 74 y.o.   MRN: [id number]  Patient returns with RT shoulder pain Left shoulder is doing better RT hurts with reaching behind his back Feels some pain with biceps flexion  No night pain Reaching overhead is painful No recent injury  Left shoulder has felt better  Previous shoulder injuries better with NTG  PEXAM Obese/ NAD BP 132/64 mmHg  Pulse 71  Ht 5\' 10"  (1.778 m)  Wt 290 lb (131.543 kg)  BMI 41.61 kg/m2  Pain with speeds and yergasons IR/ER are strong/ no pain Full ROM  Pushoff is non painful  Elevation with biceps resistance causes most pain

## 2015-03-11 DIAGNOSIS — H2510 Age-related nuclear cataract, unspecified eye: Secondary | ICD-10-CM | POA: Insufficient documentation

## 2015-04-09 ENCOUNTER — Other Ambulatory Visit: Payer: Self-pay | Admitting: Sports Medicine

## 2015-09-02 ENCOUNTER — Encounter: Payer: Self-pay | Admitting: Sports Medicine

## 2015-09-02 ENCOUNTER — Ambulatory Visit (INDEPENDENT_AMBULATORY_CARE_PROVIDER_SITE_OTHER): Payer: Medicare Other | Admitting: Sports Medicine

## 2015-09-02 VITALS — BP 134/64 | Ht 70.0 in | Wt 300.0 lb

## 2015-09-02 DIAGNOSIS — M21372 Foot drop, left foot: Secondary | ICD-10-CM

## 2015-09-02 DIAGNOSIS — M21371 Foot drop, right foot: Secondary | ICD-10-CM

## 2015-09-02 NOTE — Progress Notes (Signed)
Albert Presume Sr. - 75 y.o. male MRN [id number]  Date of birth: 1940-09-25  CC: Bilateral knee and lower leg pain, right greater than left  SUBJECTIVE:   HPI  Albert Barr is a very pleasant 75 year old male who presents with unsteadiness while standing.  He has been diagnosed with foot drop b/l 2/2 neuropathy of unknown etiology. He reports having multiple EMGs & a lumbar MRI at [facility] ~ 2 years ago.  He takes gabapentin for the burning pain, which helps.  Unfortunately his discomfort, unsteadiness, and ability to compensate has worsened over the last 2 years. He is trying to lose weight with a walking program, but has noticed worsened right posteriomedial knee pain.  He his legs are beginning to give out.  He does not have an AFO.  He is considering getting Bariatric surgery. He also notes that his right great toe is excessively painful.    ROS:    As above, denies fevers chills or night sweats. Denies any joint swelling or erythema.   HISTORY: Past Medical, Surgical, Social, and Family History Reviewed & Updated per EMR.  Pertinent Historical Findings include: Asthma, morbid obesity, sleep apnea, gastritis, foot drop b/l, neuropathy of unknown etiology.    OBJECTIVE: BP 134/64 mmHg  Ht 5\' 10"  (1.778 m)  Wt 300 lb (136.079 kg)  BMI 43.05 kg/m2  Physical Exam  Calm, NAD No acute distress  Knee:right Normal to inspection with no erythema or effusion or obvious bony abnormalities. Palpation normal with no warmth or joint line tenderness or patellar tenderness or condyle tenderness. Tender Semitendinous 3 cm above joint line.   ROM normal in flexion and extension and lower leg rotation. Ligaments with solid consistent endpoints including ACL, PCL, LCL, MCL. Exam limited by body habitus.  Negative modified apley due to body habitus. Non painful patellar compression. Patellar and quadriceps tendons unremarkable. Hamstring and quadriceps strength is normal.  Pes planus b/l.  Erythematous,  although not acutely infected great toe. No fluctuance or drainage.    Steppage gait.    MEDICATIONS, LABS & OTHER ORDERS: Previous Medications   ADVAIR DISKUS 250-50 MCG/DOSE AEPB    Inhale 1 puff into the lungs 2 (two) times daily.    ALBUTEROL (PROVENTIL HFA;VENTOLIN HFA) 108 (90 BASE) MCG/ACT INHALER    Inhale 2 puffs into the lungs every 6 (six) hours as needed.     ANDROGEL PUMP 20.25 MG/ACT (1.62%) GEL    3 Squirts daily.    ASPIRIN 81 MG TABLET    Take 81 mg by mouth daily.     ASPIRIN EC 81 MG TABLET    Take 81 mg by mouth.   BENZONATATE (TESSALON) 100 MG CAPSULE       CYANOCOBALAMIN (VITAMIN B-12 IJ)    Inject 1,000 mcg as directed every 30 (thirty) days.   ESOMEPRAZOLE (NEXIUM) 40 MG CAPSULE    Take 40 mg by mouth daily before breakfast.     FISH OIL-OMEGA-3 FATTY ACIDS 1000 MG CAPSULE    Take 1 g by mouth 2 (two) times daily.     FOLIC ACID (FOLVITE) 1 MG TABLET    Take 1 mg by mouth daily.    FOLIC ACID (FOLVITE) 1 MG TABLET    Take 1 mg by mouth.   GABAPENTIN (NEURONTIN) 300 MG CAPSULE    Take 600 mg by mouth at bedtime.    IFEREX 150 150 MG CAPSULE    Take by mouth daily.   IRON POLYSACCHARIDES (NIFEREX) 150 MG CAPSULE  Take 150 mg by mouth.   LEVOFLOXACIN (LEVAQUIN) 750 MG TABLET       LORATADINE (CLARITIN) 10 MG TABLET    Take 10 mg by mouth as needed.    LORATADINE (SM LORATADINE) 5 MG/5ML SYRUP    Take 10 mg by mouth.   MINOCYCLINE (MINOCIN,DYNACIN) 100 MG CAPSULE       MINOCYCLINE (MINOCIN,DYNACIN) 100 MG CAPSULE    Take 100 mg by mouth.   MINOCYCLINE (MINOCIN,DYNACIN) 50 MG CAPSULE    Take 100 mg by mouth daily.   MULTIPLE VITAMIN (VITAMIN E/FOLIC [id number]) CAPS    Take by mouth every morning.   NITROGLYCERIN (NITRODUR - DOSED IN MG/24 HR) 0.2 MG/HR PATCH    APPLY 1/4 OF A PATCH TO AFFECTED AREA DAILY AS DIRECTED BY YOUR DOCTOR   OMEGA-3 1000 MG CAPS    Take 2 g by mouth.   POLYETHYL GLYCOL-PROPYL GLYCOL (SYSTANE) 0.4-0.3 % SOLN       ROSUVASTATIN (CRESTOR)  20 MG TABLET    Take 20 mg by mouth daily.     ROSUVASTATIN (CRESTOR) 20 MG TABLET    Take 20 mg by mouth.   TESTOSTERONE 25 MG/2.5GM (1%) GEL    Place 5 g onto the skin.   TRAMADOL (ULTRAM) 50 MG TABLET    take 1 tablet by mouth every 6 hours if needed   TRAMADOL (ULTRAM) 50 MG TABLET       VITAMIN E 400 UNIT CAPSULE    Take by mouth.   VITAMIN E 400 UNITS TABS    Take 1 tablet by mouth 2 (two) times daily.   ZAFIRLUKAST (ACCOLATE) 20 MG TABLET    Take 20 mg by mouth 2 (two) times daily.     ZAFIRLUKAST (ACCOLATE) 20 MG TABLET    Take 20 mg by mouth.   Modified Medications   No medications on file   New Prescriptions   No medications on file   Discontinued Medications   No medications on file   Orders Placed This Encounter  Procedures  . Ambulatory referral to Orthotist  . Ambulatory referral to Physical Therapy   ASSESSMENT & PLAN: Foot Drop b/l 2/2 neuropathy of unknown etiology: Provided prosthetics outfitter's information. He will look to have an AFO made b/l. Additionally we added a heel lift to the toe of his current insoles and removed his 3/4 orthotic.  He did stumble on the door threshold, but we did catch him and he did not hit his head. We will send him to neuropathy PT.    We also asked him to bring in his records in regards to his EMGs and MRI. Overall his story seems consistent with spinal stenosis, but his previous eval seems to rule this out.   Distal semitendinosus strain on the right: PT as above. Asked to try cycling instead of walking for exercise although he should not stop walking, just make it a secondary form of exercise.   F/u 6 weeks.  He will be considering bariatric surgery in the coming months.

## 2015-09-07 ENCOUNTER — Encounter: Payer: Self-pay | Admitting: Physical Therapy

## 2015-09-07 ENCOUNTER — Ambulatory Visit: Payer: Medicare Other | Attending: Sports Medicine | Admitting: Physical Therapy

## 2015-09-07 DIAGNOSIS — R269 Unspecified abnormalities of gait and mobility: Secondary | ICD-10-CM | POA: Diagnosis present

## 2015-09-07 DIAGNOSIS — Z7409 Other reduced mobility: Secondary | ICD-10-CM

## 2015-09-07 DIAGNOSIS — M259 Joint disorder, unspecified: Secondary | ICD-10-CM | POA: Diagnosis present

## 2015-09-07 DIAGNOSIS — M25673 Stiffness of unspecified ankle, not elsewhere classified: Secondary | ICD-10-CM

## 2015-09-07 DIAGNOSIS — R29818 Other symptoms and signs involving the nervous system: Secondary | ICD-10-CM | POA: Diagnosis present

## 2015-09-07 DIAGNOSIS — M21372 Foot drop, left foot: Secondary | ICD-10-CM | POA: Diagnosis present

## 2015-09-07 DIAGNOSIS — R2991 Unspecified symptoms and signs involving the musculoskeletal system: Secondary | ICD-10-CM | POA: Insufficient documentation

## 2015-09-07 DIAGNOSIS — M21371 Foot drop, right foot: Secondary | ICD-10-CM | POA: Insufficient documentation

## 2015-09-07 DIAGNOSIS — R2681 Unsteadiness on feet: Secondary | ICD-10-CM | POA: Insufficient documentation

## 2015-09-07 DIAGNOSIS — R29898 Other symptoms and signs involving the musculoskeletal system: Secondary | ICD-10-CM | POA: Insufficient documentation

## 2015-09-07 DIAGNOSIS — R2689 Other abnormalities of gait and mobility: Secondary | ICD-10-CM

## 2015-09-08 NOTE — Therapy (Signed)
[facility] [address] [place], [place], [address] Phone: [phone]   Fax:  [phone]  Physical Therapy Evaluation  Patient Details  Name: Albert Barr Sr. MRN: [id number] Date of Birth: 1940-07-18 Referring Provider: Wolfgang Phoenix. Fields, MD  Encounter Date: 09/07/2015      PT End of Session - 09/07/15 1400    Visit Number 1   Number of Visits 9   Date for PT Re-Evaluation 11/06/15   Authorization Type Medicare G-Code every 10th visit & progress note   PT Start Time 1319   PT Stop Time 1400   PT Time Calculation (min) 41 min   Activity Tolerance Patient tolerated treatment well   Behavior During Therapy WFL for tasks assessed/performed      Past Medical History  Diagnosis Date  . Morbid obesity ([place])   . Hyperlipidemia   . Esophageal reflux   . Hiatal hernia   . Other voice and resonance disorders   . Atrophic gastritis without mention of hemorrhage   . Unspecified asthma(493.90)   . Obstructive sleep apnea on CPAP   . Gastric polyps     history  . Hx of adenomatous colonic polyps   . Metaplasia of esophagus 2011    "gastric metaplasia"  . Anal fissure   . Peripheral neuropathy ([place])   . Fibula fracture     hair line fracture  . Allergy   . Arthritis     shoulder, knees  . Sleep apnea     Past Surgical History  Procedure Laterality Date  . Incision and drainage perirectal abscess  2007  . Appendectomy    . Cholecystectomy    . Colonoscopy  08/20/2009 (multiple)    Tubular adenoma  . Esophagogastroduodenoscopy  08/20/09 (multiple)  . Upper gastrointestinal endoscopy    . Knee surgery      arthroscopy- both knees  . Polypectomy      There were no vitals filed for this visit.  Visit Diagnosis:  Abnormality of gait  Ankle weakness  Foot drop, bilateral  Decreased range of motion of ankle  Impaired transfers  Unsteadiness  Balance problems      Subjective Assessment -  09/07/15 1315    Subjective This 75yo male was diagnosed with neuropathy of unknown etiology with bilateral foot drop. He reports right tibia fracture 2 years ago from a fall. He reports his LEs fatigue with longer distances that results in LEs collapsing.    Pertinent History right tibia fracture, bilateral knee arthroscopic surgery, asthma, morbid obesity, sleep apnea, arthritis mainly in knees & shoulders   Limitations Standing;Walking;House hold activities   Patient Stated Goals To improve balance, strength legs to walk further.   Currently in Pain? Yes   Pain Score 5   in last week, worst 8/10, best 0/10   Pain Location Leg   Pain Orientation Right   Pain Descriptors / Indicators Throbbing;Other (Comment)  stinging   Pain Type Chronic pain   Pain Onset More than a month ago   Pain Frequency Intermittent   Aggravating Factors  walking, standing   Pain Relieving Factors sitting to rest            [facility] PT Assessment - 09/07/15 1315    Assessment   Medical Diagnosis Neuropathy Bilateral Foot Drop   Referring Provider Peterson Ao B. Fields, MD   Precautions   Precautions Fall   Restrictions   Weight Bearing Restrictions No   Balance Screen   Has the  patient fallen in the past 6 months Yes   How many times? 2  legs gave out & tripped over door threshold   Has the patient had a decrease in activity level because of a fear of falling?  Yes   Is the patient reluctant to leave their home because of a fear of falling?  No   Home Environment   Living Environment Private residence   Living Arrangements Spouse/significant other   Type of [place] to enter   Entrance Stairs-Number of Steps 2   Entrance Stairs-Rails None   Home Layout One level   [place] - single point;Grab bars - [place] is 2 story house with bedroom in basement with 14 steps left rail. Goes out to boat with wooden walk to [employer].    Prior Function   Level of Independence Independent;Independent with gait   Vocation Full time employment   Vocation Requirements meetings walking ~2 min to restroom   Observation/Other Assessments   Focus on Therapeutic Outcomes (FOTO)  46 Functional Status   Activities of Balance Confidence Scale (ABC Scale)  31.9%   Fear Avoidance Belief Questionnaire (FABQ)  26 (8)   ROM / Strength   AROM / PROM / Strength AROM;Strength   AROM   Overall AROM  Deficits   AROM Assessment Site Ankle   Right/Left Ankle Right;Left   Right Ankle Dorsiflexion -5   Left Ankle Dorsiflexion -3   Strength   Overall Strength Deficits   Strength Assessment Site Hip;Knee;Ankle   Right/Left Hip Right;Left   Right Hip Flexion 5/5   Right Hip Extension 5/5   Right Hip ABduction 5/5   Left Hip Flexion 5/5   Left Hip Extension 5/5   Left Hip ABduction 5/5   Right/Left Knee Right;Left   Right Knee Flexion 5/5   Right Knee Extension 5/5   Left Knee Flexion 5/5   Left Knee Extension 5/5   Right/Left Ankle Right;Left   Right Ankle Dorsiflexion 1/5   Right Ankle Plantar Flexion 2-/5   Left Ankle Dorsiflexion 2-/5   Left Ankle Plantar Flexion 2/5   Transfers   Transfers Sit to Stand;Stand to Sit   Sit to Stand 5: Supervision;Without upper extremity assist;From chair/3-in-1  uses back of legs against chair to stabilize   Stand to Sit 6: Modified independent (Device/Increase time);With armrests;With upper extremity assist;To chair/3-in-1   Ambulation/Gait   Ambulation/Gait Yes   Ambulation/Gait Assistance 5: Supervision   Ambulation Distance (Feet) 100 Feet   Assistive device None   Gait Pattern Right steppage;Left steppage;Step-through pattern;Decreased step length - right;Decreased stride length;Decreased dorsiflexion - right;Decreased dorsiflexion - left;Wide base of support;Poor foot clearance - left;Poor foot clearance - right  foot drop RLE, foot slap LLE   Ambulation Surface Indoor;Level   Gait  velocity 1.73 ft/sec  <1.8 ft/sec indicates fall risk   Standardized Balance Assessment   Standardized Balance Assessment Berg Balance Test;Timed Up and Go Test   Berg Balance Test   Sit to Stand Needs minimal aid to stand or to stabilize   Standing Unsupported Able to stand 2 minutes with supervision   Sitting with Back Unsupported but Feet Supported on Floor or Stool Able to sit safely and securely 2 minutes   Stand to Sit Controls descent by using hands   Transfers Able to transfer safely, minor use of hands   Standing Unsupported with Eyes Closed Unable to keep  eyes closed 3 seconds but stays steady   Standing Ubsupported with Feet Together Needs help to attain position and unable to hold for 15 seconds   From Standing, Reach Forward with Outstretched Arm Reaches forward but needs supervision   From Standing Position, Pick up Object from Floor Unable to try/needs assist to keep balance   From Standing Position, Turn to Look Behind Over each Shoulder Needs supervision when turning   Turn 360 Degrees Needs assistance while turning   Standing Unsupported, Alternately Place Feet on Step/Stool Needs assistance to keep from falling or unable to try   Standing Unsupported, One Foot in [place] help to step but can hold 15 seconds   Standing on One Leg Unable to try or needs assist to prevent fall   Total Score 19   Timed Up and Go Test   TUG Normal TUG   Normal TUG (seconds) 19.61  >13.5 sec indicates fall risk                             PT Short Term Goals - 09/07/15 1400    PT SHORT TERM GOAL #1   Title Patient verbalizes understanding of orthotic recommendations. (Target Date 10/07/2015)   Time 1   Period Months   Status New   PT SHORT TERM GOAL #2   Title Patient verbalizes fall prevention strategies including use of assistive devices. (Target Date 10/07/2015)   Time 1   Period Months   Status New   PT SHORT TERM GOAL #3   Title Patient demonstrates  understanding of intial HEP. (Target Date 10/07/2015)   Time 1   Period Months   Status New           PT Long Term Goals - 09/07/15 1400    PT LONG TERM GOAL #1   Title Patient verbalizes / demonstrates understanding of ongoing HEP / fitness plan. (Target Date 11/06/2015)   Time 2   Period Months   Status New   PT LONG TERM GOAL #2   Title Patient ambulates with AFOs & LRAD 500' modified independent.  (Target Date 11/06/2015)   Time 2   Period Months   Status New   PT LONG TERM GOAL #3   Title Patient negotiates ramps, curbs & stairs with LRAD & AFOs modified independent.  (Target Date 11/06/2015)   Time 2   Period Months   Status New   PT LONG TERM GOAL #4   Title Berg Balance >36/56 to indicate lower fall risk.  (Target Date 11/06/2015)   Time 2   Period Months   Status New   PT LONG TERM GOAL #5   Title Timed Up & Go with AFOs & no device <13.5 sec to indicate lower fall risk.  (Target Date 11/06/2015)   Time 2   Period Months   Status New               Plan - 09/07/15 1400    Clinical Impression Statement This 75yo male was diagnosed with neuropathy with bilateral foot drop Rt > Lt. He self rates Activities of Balance Confidence at 31.9% with Fear 26 (8) and Functional Status of 46%. He would benefit from bilateral AFOs to aide foot drop. He has unsafe steppage gait with instablility and velocity of 1.54ft/sec. (<1.8 ft/sec indicates fall risk). His Berg Balance score of 19/56 and Timed Up-Go both indicate high fall risk. Patient would benefit from skilled  PT to address issues.    Pt will benefit from skilled therapeutic intervention in order to improve on the following deficits Abnormal gait;Decreased activity tolerance;Decreased balance;Decreased endurance;Decreased knowledge of precautions;Decreased knowledge of use of DME;Decreased mobility;Decreased strength;Decreased range of motion;Obesity;Pain   Rehab Potential Good   Clinical Impairments Affecting Rehab  Potential morbid obesity   PT Frequency 1x / week   PT Duration 8 weeks   PT Treatment/Interventions ADLs/Self Care Home Management;DME Instruction;Gait training;Stair training;Functional mobility training;Therapeutic activities;Therapeutic exercise;Balance training;Neuromuscular re-education;Patient/family education;Orthotic Fit/Training;Passive range of motion   PT Next Visit Plan Orthotic consult, HEP for ROM & strength of ankles   Consulted and Agree with Plan of Care Patient          G-Codes - Sep 24, 2015 1400    Functional Assessment Tool Used Berg Balance 19/56   Functional Limitation Mobility: Walking and moving around   Mobility: Walking and Moving Around Current Status [phone]) At least 60 percent but less than 80 percent impaired, limited or restricted   Mobility: Walking and Moving Around Goal Status [phone]) At least 40 percent but less than 60 percent impaired, limited or restricted       Problem List Patient Active Problem List   Diagnosis Date Noted  . Degenerative arthritis of lumbar spine 09/03/2014  . Incomplete rotator cuff tear 07/22/2014  . Family history of gastric cancer 05/16/2013  . HYPERLIPIDEMIA-MIXED 05/27/2009  . MORBID OBESITY 05/27/2009  . GERD 09/11/2008  . History of colonic polyps 09/11/2008  . GASTRIC POLYP, HX OF 09/11/2008  . HOARSENESS 09/10/2008  . SLEEP APNEA 11/05/2007  . ASTHMA 10/12/2007  . GASTRITIS, CHRONIC 08/01/2006    Jamey Reas PT, DPT 09/08/2015, 6:59 AM  [facility] [address] [place], [place], [address] Phone: [phone]   Fax:  [phone]  Name: SUMIT TOROSIAN Sr. MRN: [id number] Date of Birth: [birth year]

## 2015-09-14 ENCOUNTER — Encounter: Payer: Self-pay | Admitting: Physical Therapy

## 2015-09-14 ENCOUNTER — Ambulatory Visit: Payer: Medicare Other | Admitting: Physical Therapy

## 2015-09-14 DIAGNOSIS — R29898 Other symptoms and signs involving the musculoskeletal system: Secondary | ICD-10-CM

## 2015-09-14 DIAGNOSIS — R269 Unspecified abnormalities of gait and mobility: Secondary | ICD-10-CM

## 2015-09-14 DIAGNOSIS — R2689 Other abnormalities of gait and mobility: Secondary | ICD-10-CM

## 2015-09-14 DIAGNOSIS — Z7409 Other reduced mobility: Secondary | ICD-10-CM

## 2015-09-14 DIAGNOSIS — M25673 Stiffness of unspecified ankle, not elsewhere classified: Secondary | ICD-10-CM

## 2015-09-14 DIAGNOSIS — R2681 Unsteadiness on feet: Secondary | ICD-10-CM

## 2015-09-14 DIAGNOSIS — R2991 Unspecified symptoms and signs involving the musculoskeletal system: Secondary | ICD-10-CM

## 2015-09-14 DIAGNOSIS — M21371 Foot drop, right foot: Secondary | ICD-10-CM

## 2015-09-14 DIAGNOSIS — M21372 Foot drop, left foot: Secondary | ICD-10-CM

## 2015-09-14 NOTE — Therapy (Signed)
[facility] [address] [place], [place], [address] Phone: [phone]   Fax:  [phone]  Physical Therapy Treatment  Patient Details  Name: Albert COUPLAND Sr. MRN: [id number] Date of Birth: 19-Sep-1940 Referring Provider: Wolfgang Phoenix. Fields, MD  Encounter Date: 09/14/2015      PT End of Session - 09/14/15 0850    Visit Number 2   Number of Visits 9   Date for PT Re-Evaluation 11/06/15   Authorization Type Medicare G-Code every 10th visit & progress note   PT Start Time 0808   PT Stop Time 0848   PT Time Calculation (min) 40 min   Activity Tolerance Patient tolerated treatment well   Behavior During Therapy [facility] for tasks assessed/performed      Past Medical History  Diagnosis Date  . Morbid obesity ([place])   . Hyperlipidemia   . Esophageal reflux   . Hiatal hernia   . Other voice and resonance disorders   . Atrophic gastritis without mention of hemorrhage   . Unspecified asthma(493.90)   . Obstructive sleep apnea on CPAP   . Gastric polyps     history  . Hx of adenomatous colonic polyps   . Metaplasia of esophagus 2011    "gastric metaplasia"  . Anal fissure   . Peripheral neuropathy ([place])   . Fibula fracture     hair line fracture  . Allergy   . Arthritis     shoulder, knees  . Sleep apnea     Past Surgical History  Procedure Laterality Date  . Incision and drainage perirectal abscess  2007  . Appendectomy    . Cholecystectomy    . Colonoscopy  08/20/2009 (multiple)    Tubular adenoma  . Esophagogastroduodenoscopy  08/20/09 (multiple)  . Upper gastrointestinal endoscopy    . Knee surgery      arthroscopy- both knees  . Polypectomy      There were no vitals filed for this visit.  Visit Diagnosis:  Abnormality of gait  Ankle weakness  Foot drop, bilateral  Decreased range of motion of ankle  Impaired transfers  Unsteadiness  Balance problems   Orthotic consult with Brooke Pace,  [facility]: Patient ambulated without AFOs with steppage gait with bilateral foot drop R>L and decreased step length with neither heel clearing stance foot toes. Patient ambulated with Ottobock Flex Walk-On AFOs bilateral with touch / assist for initial 55' then ambulated 76' with SBA - improved foot clearance but foot slap noted bilaterally, improved step length with minimal heel clearance beyond toes.  Patient ambulated with Ottobock Reaction AFOs bilateral with light touch for initial 30' progressed to 46' with SBA - good foot clearance & no foot slap noted, improved step length with clearing >2" beyond toes. No knee instability of flexion or extension noted but patient reports when he fatigues his legs collapse. Reaction AFOs have anterior plate which aides to facilitate knee extension / support. Discussed shoe design Bal vs Blucher style, toe box shape, depth of shoes. Patient to shop for new shoes including consulting with athletic shoe at [employer].  Therapeutic Exercise: See pt education & instruction.                            PT Education - 09/14/15 0825    Education provided Yes   Education Details ankle stretches, AFO recommendation, shoe designs   Person(s) Educated Patient   Methods Explanation;Demonstration;Verbal cues;Handout  Comprehension Verbalized understanding;Returned demonstration;Verbal cues required;Need further instruction          PT Short Term Goals - 09/07/15 1400    PT SHORT TERM GOAL #1   Title Patient verbalizes understanding of orthotic recommendations. (Target Date 10/07/2015)   Time 1   Period Months   Status New   PT SHORT TERM GOAL #2   Title Patient verbalizes fall prevention strategies including use of assistive devices. (Target Date 10/07/2015)   Time 1   Period Months   Status New   PT SHORT TERM GOAL #3   Title Patient demonstrates understanding of intial HEP. (Target Date 10/07/2015)   Time 1   Period Months   Status New            PT Long Term Goals - 09/07/15 1400    PT LONG TERM GOAL #1   Title Patient verbalizes / demonstrates understanding of ongoing HEP / fitness plan. (Target Date 11/06/2015)   Time 2   Period Months   Status New   PT LONG TERM GOAL #2   Title Patient ambulates with AFOs & LRAD 500' modified independent.  (Target Date 11/06/2015)   Time 2   Period Months   Status New   PT LONG TERM GOAL #3   Title Patient negotiates ramps, curbs & stairs with LRAD & AFOs modified independent.  (Target Date 11/06/2015)   Time 2   Period Months   Status New   PT LONG TERM GOAL #4   Title Berg Balance >36/56 to indicate lower fall risk.  (Target Date 11/06/2015)   Time 2   Period Months   Status New   PT LONG TERM GOAL #5   Title Timed Up & Go with AFOs & no device <13.5 sec to indicate lower fall risk.  (Target Date 11/06/2015)   Time 2   Period Months   Status New               Plan - 09/14/15 0957    Clinical Impression Statement Patient's gait improved the most with use of Ottobock Reaction AFOs. He will require training with AFOs which orthotist anticipates delivery in 2 weeks. Patient has general understanding of stretches for gastroc / soleus.   Pt will benefit from skilled therapeutic intervention in order to improve on the following deficits Abnormal gait;Decreased activity tolerance;Decreased balance;Decreased endurance;Decreased knowledge of precautions;Decreased knowledge of use of DME;Decreased mobility;Decreased strength;Decreased range of motion;Obesity;Pain   Rehab Potential Good   Clinical Impairments Affecting Rehab Potential morbid obesity   PT Frequency 1x / week   PT Duration 8 weeks   PT Treatment/Interventions ADLs/Self Care Home Management;DME Instruction;Gait training;Stair training;Functional mobility training;Therapeutic activities;Therapeutic exercise;Balance training;Neuromuscular re-education;Patient/family education;Orthotic Fit/Training;Passive range of  motion   PT Next Visit Plan review stretches for ankle, do 6 minute walk test, instruct in strengthening exercises for ankle   Consulted and Agree with Plan of Care Patient        Problem List Patient Active Problem List   Diagnosis Date Noted  . Degenerative arthritis of lumbar spine 09/03/2014  . Incomplete rotator cuff tear 07/22/2014  . Family history of gastric cancer 05/16/2013  . HYPERLIPIDEMIA-MIXED 05/27/2009  . MORBID OBESITY 05/27/2009  . GERD 09/11/2008  . History of colonic polyps 09/11/2008  . GASTRIC POLYP, HX OF 09/11/2008  . HOARSENESS 09/10/2008  . SLEEP APNEA 11/05/2007  . ASTHMA 10/12/2007  . GASTRITIS, CHRONIC 08/01/2006    Marque Bango  PT, DPT  09/14/2015, 10:01 AM  [facility]  [place] [address] [place], [place], [address] Phone: [phone]   Fax:  [phone]  Name: Albert BOMMER Sr. MRN: [id number] Date of Birth: 1940-09-26

## 2015-09-14 NOTE — Patient Instructions (Signed)
Gastroc, Sitting (Passive)    Sit with strap or towel around ball of foot. Gently pull toward body. Hold _20-30__ seconds.  Repeat _3-5__ times per leg per session. Do _1-3__ sessions per day.  Copyright  VHI. All rights reserved.  Calf / Gastoc: Runners' Stretch I    One leg back and straight, other forward and bent supporting weight, lean forward, gently stretching calf of back leg. Hold __20-30__ seconds. Repeat with other leg. Repeat __3-5__ times. Do _1-3___ sessions per day.  Copyright  VHI. All rights reserved.  Calf Stretch (Soleus)    Gently bend knees slightly and move one foot back. Lean into wall so that stretch is felt in back lower calf. Hold __20-30__ seconds. Repeat with other leg back. Repeat __3-5__ times. Do __1-3__ sessions per day.  http://gt2.exer.us/417   Copyright  VHI. All rights reserved.  Heel Raise / Lower - Knee Bent    Stand with support, left forefoot on 3 inch step, heel on floor. Make sure your weight is over the foot. Push heel towards floor. Hold 20-30 seconds Repeat __3-5__ times on each leg.   Copyright  VHI. All rights reserved.  ANKLE: Dorsiflexion, Step Unilateral    Stand on step, hang one heel off back of step. Hold _20-30__ seconds. _3-5__ reps per set, __1-3_ sets per day, _7__ days per week Hold onto a support.  Copyright  VHI. All rights reserved.  ANKLE: Dorsiflexion, Alternate - Sitting    Sit at edge of sitting surface, feet on floor. Raise front of one foot, Hold 5 seconds, then other foot, keeping heels down. Alternate. __10-20_ reps per set, _1-3__ sets per day, __7_ days per week   Copyright  VHI. All rights reserved.

## 2015-09-21 ENCOUNTER — Ambulatory Visit: Payer: Medicare Other | Admitting: Physical Therapy

## 2015-09-21 ENCOUNTER — Encounter: Payer: Self-pay | Admitting: Physical Therapy

## 2015-09-21 DIAGNOSIS — R29898 Other symptoms and signs involving the musculoskeletal system: Secondary | ICD-10-CM

## 2015-09-21 DIAGNOSIS — Z7409 Other reduced mobility: Secondary | ICD-10-CM

## 2015-09-21 DIAGNOSIS — R2991 Unspecified symptoms and signs involving the musculoskeletal system: Secondary | ICD-10-CM

## 2015-09-21 DIAGNOSIS — R269 Unspecified abnormalities of gait and mobility: Secondary | ICD-10-CM | POA: Diagnosis not present

## 2015-09-21 DIAGNOSIS — M21371 Foot drop, right foot: Secondary | ICD-10-CM

## 2015-09-21 DIAGNOSIS — M25673 Stiffness of unspecified ankle, not elsewhere classified: Secondary | ICD-10-CM

## 2015-09-21 DIAGNOSIS — M21372 Foot drop, left foot: Secondary | ICD-10-CM

## 2015-09-21 NOTE — Patient Instructions (Signed)
Do standing balance exercises below in corner with chair back in front of you.    Weight Shift: Anterior / Posterior (Righting / Equilibrium)    Slowly shift weight forward, arms back and hips forward over toes, until heels rise off floor. Return to starting position. Shift weight backward, arms forward and hips back over heels, until toes rise off floor. Try to use ankles to slow the motion.  Repeat __10__ times   Repeat with one foot step forward and shifting weight between feet trying to lift front foot when shifting back.   Copyright  VHI. All rights reserved.  Feet Apart, Head Motion - Eyes Closed    With eyes closed and feet shoulder width apart, move head slowly, right/left, up/down, up-right/down-left and up-left/down-right. Repeat __10__ times each direction.   Copyright  VHI. All rights reserved.  Feet Apart (Compliant Surface) Head Motion - Eyes Open    With eyes open, standing on compliant surface: ____foam____, feet shoulder width apart, move head slowly: right/left, up/down, up-right/down-left and up-left/down-right. Repeat __10__ times each direction.   Copyright  VHI. All rights reserved.  Feet Apart (Compliant Surface) Varied Arm Positions - Eyes Closed    Stand on compliant surface: ____foam___ with feet shoulder width apart and arms at side. Close eyes and visualize upright position. Try to keep balance as long as possible with goal over 10 seconds. Repeat __5__ times per session.   Copyright  VHI. All rights reserved.   ANKLE: Dorsiflexion,     1. Lie on side and move ankle up down as if pushing on gas and pull foot off. 2. Lie on back with pillow under your calf:    A) move foot up /down  B) turn feet in so soles facing each other then out facing away from each other.    Copyright  VHI. All rights reserved.  Alphabet    Place pillow under calf so foot does not rub. Moving foot and ankle only, trace the letters of the alphabet from A to  Z. Repeat __1__ times on each leg.   Copyright  VHI. All rights reserved.

## 2015-09-22 NOTE — Therapy (Signed)
[facility] [address] [place], [place], [address] Phone: [phone]   Fax:  [phone]  Physical Therapy Treatment  Patient Details  Name: Albert LOUCH Sr. MRN: [id number] Date of Birth: May 18, 1941 Referring Provider: Wolfgang Phoenix. Fields, MD  Encounter Date: 09/21/2015      PT End of Session - 09/21/15 1710    Visit Number 3   Number of Visits 9   Date for PT Re-Evaluation 11/06/15   Authorization Type Medicare G-Code every 10th visit & progress note   PT Start Time 1625   PT Stop Time 1710   PT Time Calculation (min) 45 min   Activity Tolerance Patient tolerated treatment well   Behavior During Therapy [facility] for tasks assessed/performed      Past Medical History  Diagnosis Date  . Morbid obesity ([place])   . Hyperlipidemia   . Esophageal reflux   . Hiatal hernia   . Other voice and resonance disorders   . Atrophic gastritis without mention of hemorrhage   . Unspecified asthma(493.90)   . Obstructive sleep apnea on CPAP   . Gastric polyps     history  . Hx of adenomatous colonic polyps   . Metaplasia of esophagus 2011    "gastric metaplasia"  . Anal fissure   . Peripheral neuropathy ([place])   . Fibula fracture     hair line fracture  . Allergy   . Arthritis     shoulder, knees  . Sleep apnea     Past Surgical History  Procedure Laterality Date  . Incision and drainage perirectal abscess  2007  . Appendectomy    . Cholecystectomy    . Colonoscopy  08/20/2009 (multiple)    Tubular adenoma  . Esophagogastroduodenoscopy  08/20/09 (multiple)  . Upper gastrointestinal endoscopy    . Knee surgery      arthroscopy- both knees  . Polypectomy      There were no vitals filed for this visit.  Visit Diagnosis:  Abnormality of gait  Ankle weakness  Foot drop, bilateral  Decreased range of motion of ankle  Impaired transfers      Subjective Assessment - 09/21/15 1625    Subjective Exercises are  going well.    Pertinent History right tibia fracture, bilateral knee arthroscopic surgery, asthma, morbid obesity, sleep apnea, arthritis mainly in knees & shoulders   Limitations Standing;Walking;House hold activities   Patient Stated Goals To improve balance, strength legs to walk further.   Currently in Pain? No/denies      PT assessed high top shoes he wore to PT appointment and appears would work with AFO PT recommended switching top 2 eyelets to hooks to make donning with AFO easier. 2nd pair that he brought for PT to assess were slip on loafers. The toe box design and height were adequate but donning slip-ons with AFO is difficult and can decrease effectiveness of AFO. Pt verbalized understanding.  Patient performed updated HEP below:  Do standing balance exercises below in corner with chair back in front of you.  Weight Shift: Anterior / Posterior (Righting / Equilibrium)    Slowly shift weight forward, arms back and hips forward over toes, until heels rise off floor. Return to starting position. Shift weight backward, arms forward and hips back over heels, until toes rise off floor. Try to use ankles to slow the motion.  Repeat __10__ times  Repeat with one foot step forward and shifting weight between feet trying to lift front foot  when shifting back.   Copyright  VHI. All rights reserved.  Feet Apart, Head Motion - Eyes Closed    With eyes closed and feet shoulder width apart, move head slowly, right/left, up/down, up-right/down-left and up-left/down-right. Repeat __10__ times each direction.   Copyright  VHI. All rights reserved.  Feet Apart (Compliant Surface) Head Motion - Eyes Open    With eyes open, standing on compliant surface: ____foam____, feet shoulder width apart, move head slowly: right/left, up/down, up-right/down-left and up-left/down-right. Repeat __10__ times each direction.   Copyright  VHI. All rights reserved.  Feet Apart (Compliant Surface)  Varied Arm Positions - Eyes Closed    Stand on compliant surface: ____foam___ with feet shoulder width apart and arms at side. Close eyes and visualize upright position. Try to keep balance as long as possible with goal over 10 seconds. Repeat __5__ times per session.  Copyright  VHI. All rights reserved.  ANKLE: Dorsiflexion,     1. Lie on side and move ankle up down as if pushing on gas and pull foot off.  2. Lie on back with pillow under your calf:  A) move foot up /down  B) turn feet in so soles facing each other then out facing away from each other.    Copyright  VHI. All rights reserved.  Alphabet    Place pillow under calf so foot does not rub. Moving foot and ankle only, trace the letters of the alphabet from A to Z. Repeat __1__ times on each leg.   Copyright  VHI. All rights reserved.                               PT Short Term Goals - 09/21/15 1715    PT SHORT TERM GOAL #1   Title Patient verbalizes understanding of orthotic recommendations. (Target Date 10/07/2015)   Time 1   Period Months   Status On-going   PT SHORT TERM GOAL #2   Title Patient verbalizes fall prevention strategies including use of assistive devices. (Target Date 10/07/2015)   Time 1   Period Months   Status On-going   PT SHORT TERM GOAL #3   Title Patient demonstrates understanding of intial HEP. (Target Date 10/07/2015)   Time 1   Period Months   Status On-going           PT Long Term Goals - 09/21/15 1715    PT LONG TERM GOAL #1   Title Patient verbalizes / demonstrates understanding of ongoing HEP / fitness plan. (Target Date 11/06/2015)   Time 2   Period Months   Status On-going   PT LONG TERM GOAL #2   Title Patient ambulates with AFOs & LRAD 500' modified independent.  (Target Date 11/06/2015)   Time 2   Period Months   Status On-going   PT LONG TERM GOAL #3   Title Patient negotiates ramps, curbs & stairs with LRAD & AFOs modified independent.   (Target Date 11/06/2015)   Time 2   Period Months   Status On-going   PT LONG TERM GOAL #4   Title Berg Balance >36/56 to indicate lower fall risk.  (Target Date 11/06/2015)   Time 2   Period Months   Status On-going   PT LONG TERM GOAL #5   Title Timed Up & Go with AFOs & no device <13.5 sec to indicate lower fall risk.  (Target Date 11/06/2015)   Time 2  Period Months   Status On-going               Plan - 09/21/15 1715    Clinical Impression Statement Patient seems to understand new exercises designed for strengthening. He plans to buy shoes within 1 week of AFO delivery to enable return if they are not the correct ones. Orthotist plans delivery in PT on 10/09/15.    Pt will benefit from skilled therapeutic intervention in order to improve on the following deficits Abnormal gait;Decreased activity tolerance;Decreased balance;Decreased endurance;Decreased knowledge of precautions;Decreased knowledge of use of DME;Decreased mobility;Decreased strength;Decreased range of motion;Obesity;Pain   Rehab Potential Good   Clinical Impairments Affecting Rehab Potential morbid obesity   PT Frequency 1x / week   PT Duration 8 weeks   PT Treatment/Interventions ADLs/Self Care Home Management;DME Instruction;Gait training;Stair training;Functional mobility training;Therapeutic activities;Therapeutic exercise;Balance training;Neuromuscular re-education;Patient/family education;Orthotic Fit/Training;Passive range of motion   PT Next Visit Plan Review HEP, instruct in machines & exercises at [facility] and Agree with Plan of Care Patient        Problem List Patient Active Problem List   Diagnosis Date Noted  . Degenerative arthritis of lumbar spine 09/03/2014  . Incomplete rotator cuff tear 07/22/2014  . Family history of gastric cancer 05/16/2013  . HYPERLIPIDEMIA-MIXED 05/27/2009  . MORBID OBESITY 05/27/2009  . GERD 09/11/2008  . History of colonic polyps 09/11/2008  . GASTRIC  POLYP, HX OF 09/11/2008  . HOARSENESS 09/10/2008  . SLEEP APNEA 11/05/2007  . ASTHMA 10/12/2007  . GASTRITIS, CHRONIC 08/01/2006    Jamey Reas PT, DPT 09/22/2015, 8:02 AM  [facility] [address] [place], [place], [address] Phone: [phone]   Fax:  [phone]  Name: JALLEN YOUNGERS Sr. MRN: [id number] Date of Birth: 04-20-1941

## 2015-09-28 ENCOUNTER — Ambulatory Visit: Payer: Medicare Other | Attending: Sports Medicine | Admitting: Physical Therapy

## 2015-09-28 ENCOUNTER — Encounter: Payer: Self-pay | Admitting: Physical Therapy

## 2015-09-28 DIAGNOSIS — M259 Joint disorder, unspecified: Secondary | ICD-10-CM | POA: Diagnosis not present

## 2015-09-28 DIAGNOSIS — M25671 Stiffness of right ankle, not elsewhere classified: Secondary | ICD-10-CM | POA: Insufficient documentation

## 2015-09-28 DIAGNOSIS — M21371 Foot drop, right foot: Secondary | ICD-10-CM | POA: Diagnosis present

## 2015-09-28 DIAGNOSIS — R29898 Other symptoms and signs involving the musculoskeletal system: Secondary | ICD-10-CM | POA: Insufficient documentation

## 2015-09-28 DIAGNOSIS — M25673 Stiffness of unspecified ankle, not elsewhere classified: Secondary | ICD-10-CM

## 2015-09-28 DIAGNOSIS — R29818 Other symptoms and signs involving the nervous system: Secondary | ICD-10-CM | POA: Insufficient documentation

## 2015-09-28 DIAGNOSIS — M25672 Stiffness of left ankle, not elsewhere classified: Secondary | ICD-10-CM | POA: Insufficient documentation

## 2015-09-28 DIAGNOSIS — R2689 Other abnormalities of gait and mobility: Secondary | ICD-10-CM | POA: Diagnosis present

## 2015-09-28 DIAGNOSIS — R2991 Unspecified symptoms and signs involving the musculoskeletal system: Secondary | ICD-10-CM | POA: Insufficient documentation

## 2015-09-28 DIAGNOSIS — M6281 Muscle weakness (generalized): Secondary | ICD-10-CM | POA: Insufficient documentation

## 2015-09-28 DIAGNOSIS — M21372 Foot drop, left foot: Secondary | ICD-10-CM

## 2015-09-28 DIAGNOSIS — R2681 Unsteadiness on feet: Secondary | ICD-10-CM | POA: Diagnosis present

## 2015-09-28 NOTE — Therapy (Signed)
[facility] [address] [place], [place], [address] Phone: [phone]   Fax:  [phone]  Physical Therapy Treatment  Patient Details  Name: Albert KRESSE Sr. MRN: [id number] Date of Birth: 07/17/1940 Referring Provider: Wolfgang Phoenix. Fields, MD  Encounter Date: 09/28/2015      PT End of Session - 09/28/15 1020    Visit Number 4   Number of Visits 9   Date for PT Re-Evaluation 11/06/15   Authorization Type Medicare G-Code every 10th visit & progress note   PT Start Time 0938   PT Stop Time 1020   PT Time Calculation (min) 42 min   Activity Tolerance Patient tolerated treatment well   Behavior During Therapy [facility] for tasks assessed/performed      Past Medical History  Diagnosis Date  . Morbid obesity ([place])   . Hyperlipidemia   . Esophageal reflux   . Hiatal hernia   . Other voice and resonance disorders   . Atrophic gastritis without mention of hemorrhage   . Unspecified asthma(493.90)   . Obstructive sleep apnea on CPAP   . Gastric polyps     history  . Hx of adenomatous colonic polyps   . Metaplasia of esophagus 2011    "gastric metaplasia"  . Anal fissure   . Peripheral neuropathy ([place])   . Fibula fracture     hair line fracture  . Allergy   . Arthritis     shoulder, knees  . Sleep apnea     Past Surgical History  Procedure Laterality Date  . Incision and drainage perirectal abscess  2007  . Appendectomy    . Cholecystectomy    . Colonoscopy  08/20/2009 (multiple)    Tubular adenoma  . Esophagogastroduodenoscopy  08/20/09 (multiple)  . Upper gastrointestinal endoscopy    . Knee surgery      arthroscopy- both knees  . Polypectomy      There were no vitals filed for this visit.  Visit Diagnosis:  Other abnormalities of gait and mobility  Foot drop, bilateral  Ankle weakness  Decreased range of motion of ankle  Impaired transfers  Unsteadiness  Balance problems      Subjective  Assessment - 09/28/15 0940    Subjective He plans to buy shoes after he gets AFOs next week.    Pertinent History right tibia fracture, bilateral knee arthroscopic surgery, asthma, morbid obesity, sleep apnea, arthritis mainly in knees & shoulders   Limitations Standing;Walking;House hold activities   Patient Stated Goals To improve balance, strength legs to walk further.   Currently in Pain? No/denies      Therapeutic Exercise: PT used previous printouts to review HEP instructed thus far. Patient verbalized understanding. Rocker board in parallel bars with BUE support focusing on minimizing UE weight bearing: anterior/posterior and laterally stabilization static & with head turns, wt shifting with 2-3 second hold.  Green theraband hip exercises: stepping abduction, flexion, adduction, extension using chair back for stablility 10 reps on each LE. PT issued rocker board and theraband as HEP.                           PT Education - 09/28/15 1020    Education provided Yes   Education Details Diplomatic Services operational officer exercises, Materials engineer, theraband hip exercises.   Person(s) Educated Patient   Methods Explanation;Demonstration;Tactile cues;Verbal cues;Handout   Comprehension Verbalized understanding;Returned demonstration;Verbal cues required;Tactile cues required;Need further instruction  PT Short Term Goals - 09/28/15 1020    PT SHORT TERM GOAL #1   Title Patient verbalizes understanding of orthotic recommendations. (Target Date 10/07/2015)   Time 1   Period Months   Status On-going   PT SHORT TERM GOAL #2   Title Patient verbalizes fall prevention strategies including use of assistive devices. (Target Date 10/07/2015)   Time 1   Period Months   Status On-going   PT SHORT TERM GOAL #3   Title Patient demonstrates understanding of intial HEP. (Target Date 10/07/2015)   Baseline MET 09/27/2105   Time 1   Period Months   Status Achieved            PT Long Term Goals - 09/21/15 1715    PT LONG TERM GOAL #1   Title Patient verbalizes / demonstrates understanding of ongoing HEP / fitness plan. (Target Date 11/06/2015)   Time 2   Period Months   Status On-going   PT LONG TERM GOAL #2   Title Patient ambulates with AFOs & LRAD 500' modified independent.  (Target Date 11/06/2015)   Time 2   Period Months   Status On-going   PT LONG TERM GOAL #3   Title Patient negotiates ramps, curbs & stairs with LRAD & AFOs modified independent.  (Target Date 11/06/2015)   Time 2   Period Months   Status On-going   PT LONG TERM GOAL #4   Title Berg Balance >36/56 to indicate lower fall risk.  (Target Date 11/06/2015)   Time 2   Period Months   Status On-going   PT LONG TERM GOAL #5   Title Timed Up & Go with AFOs & no device <13.5 sec to indicate lower fall risk.  (Target Date 11/06/2015)   Time 2   Period Months   Status On-going               Plan - 09/28/15 1020    Clinical Impression Statement Patient appears to understand HEP using rocker board for balance & facilitate ankle activity and hip exercises with theraband.   Pt will benefit from skilled therapeutic intervention in order to improve on the following deficits Abnormal gait;Decreased activity tolerance;Decreased balance;Decreased endurance;Decreased knowledge of precautions;Decreased knowledge of use of DME;Decreased mobility;Decreased strength;Decreased range of motion;Obesity;Pain   Rehab Potential Good   Clinical Impairments Affecting Rehab Potential morbid obesity   PT Frequency 1x / week   PT Duration 8 weeks   PT Treatment/Interventions ADLs/Self Care Home Management;DME Instruction;Gait training;Stair training;Functional mobility training;Therapeutic activities;Therapeutic exercise;Balance training;Neuromuscular re-education;Patient/family education;Orthotic Fit/Training;Passive range of motion   PT Next Visit Plan next session should recieve AFOs, instruct in gait &  use, Review HEP, instruct in machines & exercises at [facility] and Agree with Plan of Care Patient        Problem List Patient Active Problem List   Diagnosis Date Noted  . Degenerative arthritis of lumbar spine 09/03/2014  . Incomplete rotator cuff tear 07/22/2014  . Family history of gastric cancer 05/16/2013  . HYPERLIPIDEMIA-MIXED 05/27/2009  . MORBID OBESITY 05/27/2009  . GERD 09/11/2008  . History of colonic polyps 09/11/2008  . GASTRIC POLYP, HX OF 09/11/2008  . HOARSENESS 09/10/2008  . SLEEP APNEA 11/05/2007  . ASTHMA 10/12/2007  . GASTRITIS, CHRONIC 08/01/2006    Jamey Reas PT, DPT 09/28/2015, 10:05 PM  [facility] [address] [place], [place], [address] Phone: [phone]   Fax:  [phone]  Name: Albert WIEN  Sr. MRN: [id number] Date of Birth: [birth year]

## 2015-09-28 NOTE — Patient Instructions (Signed)
BILATERAL STANCE: Balance Board    Board is set to rock side to side. Stand on balance board. Distribute weight evenly on board. Light UE support.  1. Keep balance board straight / balanced. Hold _5-10__ seconds. _5-10__ reps per set, 2. Turn your head right & left and try to keep your balance. 3. Push down right side & hold 3-5 seconds. Repeat to left side.   Switch board to rock front to back. 1. Keep balance board straight / balanced. Hold 5-10 seconds. 5-10 reps 2. Look up & down 3. Push front down hold. Push back down & hold.    http://bt.exer.us/318   Copyright  VHI. All rights reserved.  HIP / KNEE: Extension - Standing (Band)    Place band around leg. Squeeze glutes. Raise and lift leg backward. Keep knee straight. Hold 2-3__ seconds. Use _____green___ band. _10__ reps per set Copyright  VHI. All rights reserved.  ABDUCTION: Standing - Resistance Band (Active)    Stand, feet flat. Against green resistance band, lift right leg out to side. Complete  _10__ repetitions.  http://gtsc.exer.us/116   Copyright  VHI. All rights reserved.  Strengthening: Hip Flexion - Resisted    With tubing around left ankle, anchor behind, bring leg forward, keeping knee straight. Repeat ____10 times per set.   http://orth.exer.us/638   Copyright  VHI. All rights reserved.  ADDUCTION: Standing - Stable: Resistance Band (Active)    Stand, right leg out to side as far as possible. Against green resistance band, draw leg in across midline. Complete_10__ repetitions.  http://gtsc.exer.us/150   Copyright  VHI. All rights reserved.

## 2015-10-05 ENCOUNTER — Encounter: Payer: Self-pay | Admitting: Physical Therapy

## 2015-10-05 ENCOUNTER — Ambulatory Visit: Payer: Medicare Other | Admitting: Physical Therapy

## 2015-10-05 DIAGNOSIS — M6281 Muscle weakness (generalized): Secondary | ICD-10-CM

## 2015-10-05 DIAGNOSIS — M25671 Stiffness of right ankle, not elsewhere classified: Secondary | ICD-10-CM

## 2015-10-05 DIAGNOSIS — R2689 Other abnormalities of gait and mobility: Secondary | ICD-10-CM | POA: Diagnosis not present

## 2015-10-05 DIAGNOSIS — M21372 Foot drop, left foot: Secondary | ICD-10-CM

## 2015-10-05 DIAGNOSIS — M25672 Stiffness of left ankle, not elsewhere classified: Secondary | ICD-10-CM

## 2015-10-05 DIAGNOSIS — M21371 Foot drop, right foot: Secondary | ICD-10-CM

## 2015-10-05 DIAGNOSIS — R2681 Unsteadiness on feet: Secondary | ICD-10-CM

## 2015-10-06 NOTE — Therapy (Signed)
[facility] [address] [place], [place], [address] Phone: [phone]   Fax:  [phone]  Physical Therapy Treatment  Patient Details  Name: Albert SIMKIN Sr. MRN: [id number] Date of Birth: [birth year] Referring Provider: Wolfgang Phoenix. Fields, MD  Encounter Date: 10/05/2015      PT End of Session - 10/05/15 1015    Visit Number 5   Number of Visits 9   Date for PT Re-Evaluation 11/06/15   Authorization Type Medicare G-Code every 10th visit & progress note   PT Start Time 0935   PT Stop Time 1025   PT Time Calculation (min) 50 min   Equipment Utilized During Treatment Gait belt   Activity Tolerance Patient tolerated treatment well   Behavior During Therapy WFL for tasks assessed/performed      Past Medical History  Diagnosis Date  . Morbid obesity ([place])   . Hyperlipidemia   . Esophageal reflux   . Hiatal hernia   . Other voice and resonance disorders   . Atrophic gastritis without mention of hemorrhage   . Unspecified asthma(493.90)   . Obstructive sleep apnea on CPAP   . Gastric polyps     history  . Hx of adenomatous colonic polyps   . Metaplasia of esophagus 2011    "gastric metaplasia"  . Anal fissure   . Peripheral neuropathy ([place])   . Fibula fracture     hair line fracture  . Allergy   . Arthritis     shoulder, knees  . Sleep apnea     Past Surgical History  Procedure Laterality Date  . Incision and drainage perirectal abscess  2007  . Appendectomy    . Cholecystectomy    . Colonoscopy  08/20/2009 (multiple)    Tubular adenoma  . Esophagogastroduodenoscopy  08/20/09 (multiple)  . Upper gastrointestinal endoscopy    . Knee surgery      arthroscopy- both knees  . Polypectomy      There were no vitals filed for this visit.      Subjective Assessment - 10/05/15 0937    Subjective (p) no falls. Exercises are going well.    Pertinent History (p) right tibia fracture, bilateral knee  arthroscopic surgery, asthma, morbid obesity, sleep apnea, arthritis mainly in knees & shoulders   Limitations (p) Standing;Walking;House hold activities   Patient Stated Goals (p) To improve balance, strength legs to walk further.   Currently in Pain? (p) No/denies     Orthotic Training: Patient received bilateral [place] Reaction AFOs during PT session. PT donned while instructing patient in proper donning and foot wear selection. Pt verbalized understanding See pt education. Patient sit to /from stand with PT demo, verbal instruction in technique. PT used stabilizing object of chair back to facilitate improved forward weight shift. Pt performed >20 reps during session with improved balance. Patient ambulated 300' X 2 with AFOs only with initial min guard progressing to supervision with improved step length & swing clearance. Patient negotiated stairs with 2 rails reciprocally with cues on technique with AFOs. Patient negotiated ramps & curbs with AFOs with initial minA progressed to supervision with cues on technique with AFOs.                            PT Education - 10/05/15 0935    Education provided Yes   Education Details wear AFOs 2 hrs 2x/day for 2 days if no issues, increase 1hr per session  every 2 days. Checking feet & lower legs for redness or pressure points. Releasing right AFO straps to drive. Shoe differences with laces vs slip-ons for AFO effectiveness   Person(s) Educated Patient   Methods Explanation;Demonstration;Verbal cues   Comprehension Verbalized understanding;Verbal cues required          PT Short Term Goals - 10/05/15 1015    PT SHORT TERM GOAL #1   Title Patient verbalizes understanding of orthotic recommendations. (Target Date 10/07/2015)   Baseline MET 10/05/2015   Time 1   Period Months   Status Achieved   PT SHORT TERM GOAL #2   Title Patient verbalizes fall prevention strategies including use of assistive devices. (Target Date  10/07/2015)   Baseline MET 10/05/2015   Time 1   Period Months   Status Achieved   PT SHORT TERM GOAL #3   Title Patient demonstrates understanding of intial HEP. (Target Date 10/07/2015)   Baseline MET 09/27/2105   Time 1   Period Months   Status Achieved           PT Long Term Goals - 09/21/15 1715    PT LONG TERM GOAL #1   Title Patient verbalizes / demonstrates understanding of ongoing HEP / fitness plan. (Target Date 11/06/2015)   Time 2   Period Months   Status On-going   PT LONG TERM GOAL #2   Title Patient ambulates with AFOs & LRAD 500' modified independent.  (Target Date 11/06/2015)   Time 2   Period Months   Status On-going   PT LONG TERM GOAL #3   Title Patient negotiates ramps, curbs & stairs with LRAD & AFOs modified independent.  (Target Date 11/06/2015)   Time 2   Period Months   Status On-going   PT LONG TERM GOAL #4   Title Berg Balance >36/56 to indicate lower fall risk.  (Target Date 11/06/2015)   Time 2   Period Months   Status On-going   PT LONG TERM GOAL #5   Title Timed Up & Go with AFOs & no device <13.5 sec to indicate lower fall risk.  (Target Date 11/06/2015)   Time 2   Period Months   Status On-going               Plan - 10/05/15 1100    Clinical Impression Statement Patient met remaining 2 of 3 STGs. His gait improved with AFOs with longer step length and less energy expenditure. Patient verbalized understanding of increased risk of falling once his body accommodates to support of AFOs and he attempts to walk without them like middle of night for toileting. He verbalized proper fall prevention strategies.    Rehab Potential Good   Clinical Impairments Affecting Rehab Potential morbid obesity   PT Frequency 1x / week   PT Duration 8 weeks   PT Treatment/Interventions ADLs/Self Care Home Management;DME Instruction;Gait training;Stair training;Functional mobility training;Therapeutic activities;Therapeutic exercise;Balance  training;Neuromuscular re-education;Patient/family education;Orthotic Fit/Training;Passive range of motion   PT Next Visit Plan gait with AFOs, Review HEP, instruct in machines & exercises at [facility] and Agree with Plan of Care Patient      Patient will benefit from skilled therapeutic intervention in order to improve the following deficits and impairments:  Abnormal gait, Decreased activity tolerance, Decreased balance, Decreased endurance, Decreased knowledge of precautions, Decreased knowledge of use of DME, Decreased mobility, Decreased strength, Decreased range of motion, Obesity, Pain  Visit Diagnosis: Other abnormalities of gait and mobility  Foot drop, left  Muscle weakness (generalized)  Stiffness of left ankle, not elsewhere classified  Unsteadiness on feet  Foot drop, right  Stiffness of right ankle, not elsewhere classified     Problem List Patient Active Problem List   Diagnosis Date Noted  . Degenerative arthritis of lumbar spine 09/03/2014  . Incomplete rotator cuff tear 07/22/2014  . Family history of gastric cancer 05/16/2013  . HYPERLIPIDEMIA-MIXED 05/27/2009  . MORBID OBESITY 05/27/2009  . GERD 09/11/2008  . History of colonic polyps 09/11/2008  . GASTRIC POLYP, HX OF 09/11/2008  . HOARSENESS 09/10/2008  . SLEEP APNEA 11/05/2007  . ASTHMA 10/12/2007  . GASTRITIS, CHRONIC 08/01/2006    Jamey Reas PT, DPT 10/06/2015, 8:39 AM  [facility] [address] [place], [place], [address] Phone: [phone]   Fax:  [phone]  Name: Albert TUCKEY Sr. MRN: [id number] Date of Birth: 07/28/1940

## 2015-10-12 ENCOUNTER — Encounter: Payer: Self-pay | Admitting: Physical Therapy

## 2015-10-12 ENCOUNTER — Ambulatory Visit: Payer: Medicare Other | Admitting: Physical Therapy

## 2015-10-12 DIAGNOSIS — M25672 Stiffness of left ankle, not elsewhere classified: Secondary | ICD-10-CM

## 2015-10-12 DIAGNOSIS — R2689 Other abnormalities of gait and mobility: Secondary | ICD-10-CM

## 2015-10-12 DIAGNOSIS — M21372 Foot drop, left foot: Secondary | ICD-10-CM

## 2015-10-12 DIAGNOSIS — M21371 Foot drop, right foot: Secondary | ICD-10-CM

## 2015-10-12 DIAGNOSIS — M6281 Muscle weakness (generalized): Secondary | ICD-10-CM

## 2015-10-12 DIAGNOSIS — R2681 Unsteadiness on feet: Secondary | ICD-10-CM

## 2015-10-12 NOTE — Therapy (Signed)
[facility] [address] [place], [place], [address] Phone: [phone]   Fax:  [phone]  Physical Therapy Treatment  Patient Details  Name: Albert BARHAM Sr. MRN: [id number] Date of Birth: [birth year] Referring Provider: Wolfgang Phoenix. Fields, MD  Encounter Date: 10/12/2015      PT End of Session - 10/12/15 0938    Visit Number 6   Number of Visits 9   Date for PT Re-Evaluation 11/06/15   Authorization Type Medicare G-Code every 10th visit & progress note   PT Start Time 0938   PT Stop Time 1017   PT Time Calculation (min) 39 min   Equipment Utilized During Treatment Gait belt   Activity Tolerance Patient tolerated treatment well   Behavior During Therapy WFL for tasks assessed/performed      Past Medical History  Diagnosis Date  . Morbid obesity ([place])   . Hyperlipidemia   . Esophageal reflux   . Hiatal hernia   . Other voice and resonance disorders   . Atrophic gastritis without mention of hemorrhage   . Unspecified asthma(493.90)   . Obstructive sleep apnea on CPAP   . Gastric polyps     history  . Hx of adenomatous colonic polyps   . Metaplasia of esophagus 2011    "gastric metaplasia"  . Anal fissure   . Peripheral neuropathy ([place])   . Fibula fracture     hair line fracture  . Allergy   . Arthritis     shoulder, knees  . Sleep apnea     Past Surgical History  Procedure Laterality Date  . Incision and drainage perirectal abscess  2007  . Appendectomy    . Cholecystectomy    . Colonoscopy  08/20/2009 (multiple)    Tubular adenoma  . Esophagogastroduodenoscopy  08/20/09 (multiple)  . Upper gastrointestinal endoscopy    . Knee surgery      arthroscopy- both knees  . Polypectomy      There were no vitals filed for this visit.      Subjective Assessment - 10/12/15 0940    Subjective No falls. He wore braces 6hrs straight without issues. He increased walking at [facility] to now takes 54mn  compared to 577m   Pertinent History right tibia fracture, bilateral knee arthroscopic surgery, asthma, morbid obesity, sleep apnea, arthritis mainly in knees & shoulders   Limitations Standing;Walking;House hold activities   Patient Stated Goals To improve balance, strength legs to walk further.   Currently in Pain? No/denies     Gait Training with bil. AFOs. Patient arrived wearing AFOs with report of wear up to 6 hrs without issues. PT instructed to increase wear to all awake hours but continue to monitor skin issues and reduce wear if any skin issues, redness or tenderness. Pt verbalized understanding. Patient ambulated 250' X 3 with AFOs only with supervision.   Therapeutic Exercise: PT reviewed current HEP exercises with pt verbalizing understanding.  Rocker Board with sink & chair back for stabilization, safety: Anterior/posterior & lateral - stabilization static & head movements, and weight shift with stabilization.  Wall sliding UEs upward with focus to engage plantarflexors for last few inches. Wall bumps using ankle dorsiflexors to slow posteriorly. Pt questioning [employer] equipment to work gastroc: PT used interenet to show seated heel raises, (discussed standing would not work with his current strength), leg press machine for calf press. Pt performed bil. Calf press 40# 15 reps, single leg calf press 30# 10 reps each with verbal  cues.                               PT Short Term Goals - 10/05/15 1015    PT SHORT TERM GOAL #1   Title Patient verbalizes understanding of orthotic recommendations. (Target Date 10/07/2015)   Baseline MET 10/05/2015   Time 1   Period Months   Status Achieved   PT SHORT TERM GOAL #2   Title Patient verbalizes fall prevention strategies including use of assistive devices. (Target Date 10/07/2015)   Baseline MET 10/05/2015   Time 1   Period Months   Status Achieved   PT SHORT TERM GOAL #3   Title Patient demonstrates understanding of  intial HEP. (Target Date 10/07/2015)   Baseline MET 09/27/2105   Time 1   Period Months   Status Achieved           PT Long Term Goals - 09/21/15 1715    PT LONG TERM GOAL #1   Title Patient verbalizes / demonstrates understanding of ongoing HEP / fitness plan. (Target Date 11/06/2015)   Time 2   Period Months   Status On-going   PT LONG TERM GOAL #2   Title Patient ambulates with AFOs & LRAD 500' modified independent.  (Target Date 11/06/2015)   Time 2   Period Months   Status On-going   PT LONG TERM GOAL #3   Title Patient negotiates ramps, curbs & stairs with LRAD & AFOs modified independent.  (Target Date 11/06/2015)   Time 2   Period Months   Status On-going   PT LONG TERM GOAL #4   Title Berg Balance >36/56 to indicate lower fall risk.  (Target Date 11/06/2015)   Time 2   Period Months   Status On-going   PT LONG TERM GOAL #5   Title Timed Up & Go with AFOs & no device <13.5 sec to indicate lower fall risk.  (Target Date 11/06/2015)   Time 2   Period Months   Status On-going               Plan - 10/12/15 1350    Clinical Impression Statement Patient has noted significant increase in gait & mobility with AFOs. Patient understands HEP updated. Patient is on target to meet LTGs next week.    Rehab Potential Good   Clinical Impairments Affecting Rehab Potential morbid obesity   PT Frequency 1x / week   PT Duration 8 weeks   PT Treatment/Interventions ADLs/Self Care Home Management;DME Instruction;Gait training;Stair training;Functional mobility training;Therapeutic activities;Therapeutic exercise;Balance training;Neuromuscular re-education;Patient/family education;Orthotic Fit/Training;Passive range of motion   PT Next Visit Plan assess LTGs   Consulted and Agree with Plan of Care Patient      Patient will benefit from skilled therapeutic intervention in order to improve the following deficits and impairments:  Abnormal gait, Decreased activity tolerance,  Decreased balance, Decreased endurance, Decreased knowledge of precautions, Decreased knowledge of use of DME, Decreased mobility, Decreased strength, Decreased range of motion, Obesity, Pain  Visit Diagnosis: Other abnormalities of gait and mobility  Muscle weakness (generalized)  Stiffness of left ankle, not elsewhere classified  Unsteadiness on feet  Foot drop, left  Foot drop, right     Problem List Patient Active Problem List   Diagnosis Date Noted  . Degenerative arthritis of lumbar spine 09/03/2014  . Incomplete rotator cuff tear 07/22/2014  . Family history of gastric cancer 05/16/2013  . HYPERLIPIDEMIA-MIXED 05/27/2009  .  MORBID OBESITY 05/27/2009  . GERD 09/11/2008  . History of colonic polyps 09/11/2008  . GASTRIC POLYP, HX OF 09/11/2008  . HOARSENESS 09/10/2008  . SLEEP APNEA 11/05/2007  . ASTHMA 10/12/2007  . GASTRITIS, CHRONIC 08/01/2006    Jamey Reas  PT, DPT  10/12/2015, 1:53 PM  [facility] [address] [place], [place], [address] Phone: [phone]   Fax:  [phone]  Name: Albert KAUFFMANN Sr. MRN: [id number] Date of Birth: [birth year]

## 2015-10-19 ENCOUNTER — Encounter: Payer: Self-pay | Admitting: Physical Therapy

## 2015-10-19 ENCOUNTER — Telehealth: Payer: Self-pay | Admitting: Internal Medicine

## 2015-10-19 ENCOUNTER — Ambulatory Visit: Payer: Medicare Other | Admitting: Physical Therapy

## 2015-10-19 DIAGNOSIS — M25672 Stiffness of left ankle, not elsewhere classified: Secondary | ICD-10-CM

## 2015-10-19 DIAGNOSIS — R2689 Other abnormalities of gait and mobility: Secondary | ICD-10-CM | POA: Diagnosis not present

## 2015-10-19 DIAGNOSIS — M21372 Foot drop, left foot: Secondary | ICD-10-CM

## 2015-10-19 DIAGNOSIS — M21371 Foot drop, right foot: Secondary | ICD-10-CM

## 2015-10-19 DIAGNOSIS — R2681 Unsteadiness on feet: Secondary | ICD-10-CM

## 2015-10-19 DIAGNOSIS — M6281 Muscle weakness (generalized): Secondary | ICD-10-CM

## 2015-10-19 DIAGNOSIS — M25671 Stiffness of right ankle, not elsewhere classified: Secondary | ICD-10-CM

## 2015-10-19 NOTE — Addendum Note (Signed)
Addended by: Isaias Cowman on: 10/19/2015 08:32 AM   Modules accepted: Orders

## 2015-10-19 NOTE — Telephone Encounter (Signed)
Yes should be for both.  Due for colon in June

## 2015-10-19 NOTE — Therapy (Signed)
[facility] [address] [place], [place], [address] Phone: [phone]   Fax:  [phone]  Physical Therapy Treatment  Patient Details  Name: Albert MACOMBER Sr. MRN: [id number] Date of Birth: [birth year] Referring Provider: Wolfgang Phoenix. Fields, MD  Encounter Date: 10/19/2015      PT End of Session - 10/19/15 1337    Visit Number 7   Number of Visits 9   Date for PT Re-Evaluation 11/06/15   Authorization Type Medicare G-Code every 10th visit & progress note   PT Start Time 0942   PT Stop Time 1015   PT Time Calculation (min) 33 min   Equipment Utilized During Treatment Gait belt   Activity Tolerance Patient tolerated treatment well   Behavior During Therapy WFL for tasks assessed/performed      Past Medical History  Diagnosis Date  . Morbid obesity ([place])   . Hyperlipidemia   . Esophageal reflux   . Hiatal hernia   . Other voice and resonance disorders   . Atrophic gastritis without mention of hemorrhage   . Unspecified asthma(493.90)   . Obstructive sleep apnea on CPAP   . Gastric polyps     history  . Hx of adenomatous colonic polyps   . Metaplasia of esophagus 2011    "gastric metaplasia"  . Anal fissure   . Peripheral neuropathy ([place])   . Fibula fracture     hair line fracture  . Allergy   . Arthritis     shoulder, knees  . Sleep apnea     Past Surgical History  Procedure Laterality Date  . Incision and drainage perirectal abscess  2007  . Appendectomy    . Cholecystectomy    . Colonoscopy  08/20/2009 (multiple)    Tubular adenoma  . Esophagogastroduodenoscopy  08/20/09 (multiple)  . Upper gastrointestinal endoscopy    . Knee surgery      arthroscopy- both knees  . Polypectomy      There were no vitals filed for this visit.      Subjective Assessment - 10/19/15 0944    Subjective No falls. He developed rash on right foot & used corizone so better. Wearing AFOs all day.    Pertinent  History right tibia fracture, bilateral knee arthroscopic surgery, asthma, morbid obesity, sleep apnea, arthritis mainly in knees & shoulders   Limitations Standing;Walking;House hold activities   Patient Stated Goals To improve balance, strength legs to walk further.   Currently in Pain? No/denies            [facility] PT Assessment - 10/19/15 0930    Observation/Other Assessments   Focus on Therapeutic Outcomes (FOTO)  64 Functional Status  initial FS 46   Activities of Balance Confidence Scale (ABC Scale)  36.9%  initial ABC 31.9%   Fear Avoidance Belief Questionnaire (FABQ)  26 (8)   Transfers   Transfers Sit to Stand;Stand to Sit   Sit to Stand 6: Modified independent (Device/Increase time);With upper extremity assist;From chair/3-in-1   Stand to Sit 6: Modified independent (Device/Increase time);Without upper extremity assist;To chair/3-in-1   Ambulation/Gait   Ambulation/Gait Yes   Ambulation/Gait Assistance 6: Modified independent (Device/Increase time)   Ambulation Distance (Feet) 600 Feet   Assistive device None  Bilateral AFOs   Gait Pattern Step-through pattern;Decreased stride length;Wide base of support   Ambulation Surface Indoor;Level   Gait velocity 2.81 ft/sec  1.73 ft/sec initial   Ramp 6: Modified independent (Device)  AFOs   Curb 6: Modified  independent (Device/increase time)  AFOs, descends with side step   Berg Balance Test   Sit to Stand Able to stand  independently using hands   Standing Unsupported Able to stand safely 2 minutes   Sitting with Back Unsupported but Feet Supported on Floor or Stool Able to sit safely and securely 2 minutes   Stand to Sit Sits safely with minimal use of hands   Transfers Able to transfer safely, minor use of hands   Standing Unsupported with Eyes Closed Able to stand 10 seconds safely   Standing Ubsupported with Feet Together Needs help to attain position but able to stand for 30 seconds with feet together   From Standing,  Reach Forward with Outstretched Arm Can reach forward >12 cm safely (5")   From Standing Position, Pick up Object from Floor Unable to pick up and needs supervision   From Standing Position, Turn to Look Behind Over each Shoulder Turn sideways only but maintains balance   Turn 360 Degrees Able to turn 360 degrees safely but slowly   Standing Unsupported, Alternately Place Feet on Step/Stool Needs assistance to keep from falling or unable to try   Standing Unsupported, One Foot in Front Able to take small step independently and hold 30 seconds   Standing on One Leg Tries to lift leg/unable to hold 3 seconds but remains standing independently   Total Score 35   Timed Up and Go Test   Normal TUG (seconds) 11.37  AFOs                               PT Short Term Goals - 10/05/15 1015    PT SHORT TERM GOAL #1   Title Patient verbalizes understanding of orthotic recommendations. (Target Date 10/07/2015)   Baseline MET 10/05/2015   Time 1   Period Months   Status Achieved   PT SHORT TERM GOAL #2   Title Patient verbalizes fall prevention strategies including use of assistive devices. (Target Date 10/07/2015)   Baseline MET 10/05/2015   Time 1   Period Months   Status Achieved   PT SHORT TERM GOAL #3   Title Patient demonstrates understanding of intial HEP. (Target Date 10/07/2015)   Baseline MET 09/27/2105   Time 1   Period Months   Status Achieved           PT Long Term Goals - 10/19/15 1006    PT LONG TERM GOAL #1   Title Patient verbalizes / demonstrates understanding of ongoing HEP / fitness plan. (Target Date 11/06/2015)   Baseline MEt 10/19/2015   Time 2   Period Months   Status Achieved   PT LONG TERM GOAL #2   Title Patient ambulates with AFOs & LRAD 500' modified independent.  (Target Date 11/06/2015)   Baseline MET 10/19/2015 with only AFOs   Time 2   Period Months   Status Achieved   PT LONG TERM GOAL #3   Title Patient negotiates ramps, curbs &  stairs with LRAD & AFOs modified independent.  (Target Date 11/06/2015)   Baseline MET 10/19/2015 with AFOs on ramps & curbs (modified descend with side step) and stairs with single rail.    Time 2   Period Months   Status Achieved   PT LONG TERM GOAL #4   Title Berg Balance >36/56 to indicate lower fall risk.  (Target Date 11/06/2015)   Baseline Partially MET improved Berg Balance from 19/56  to 35/56   Time 2   Period Months   Status Partially Met   PT LONG TERM GOAL #5   Title Timed Up & Go with AFOs & no device <13.5 sec to indicate lower fall risk.  (Target Date 11/06/2015)   Baseline MET 10/19/2015  TUG 11.37sec with only AFOs   Time 2   Period Months   Status Achieved               Plan - 10/19/15 1337    Clinical Impression Statement Patient met or partially met all LTGs. Patient reports significant improvement in mobility & understanding of HEP.    Rehab Potential Good   Clinical Impairments Affecting Rehab Potential morbid obesity   PT Frequency 1x / week   PT Duration 8 weeks   PT Treatment/Interventions ADLs/Self Care Home Management;DME Instruction;Gait training;Stair training;Functional mobility training;Therapeutic activities;Therapeutic exercise;Balance training;Neuromuscular re-education;Patient/family education;Orthotic Fit/Training;Passive range of motion   PT Next Visit Plan discharge   Consulted and Agree with Plan of Care Patient      Patient will benefit from skilled therapeutic intervention in order to improve the following deficits and impairments:  Abnormal gait, Decreased activity tolerance, Decreased balance, Decreased endurance, Decreased knowledge of precautions, Decreased knowledge of use of DME, Decreased mobility, Decreased strength, Decreased range of motion, Obesity, Pain  Visit Diagnosis: Other abnormalities of gait and mobility  Muscle weakness (generalized)  Unsteadiness on feet  Foot drop, left  Foot drop, right  Stiffness of left  ankle, not elsewhere classified  Stiffness of right ankle, not elsewhere classified     Problem List Patient Active Problem List   Diagnosis Date Noted  . Degenerative arthritis of lumbar spine 09/03/2014  . Incomplete rotator cuff tear 07/22/2014  . Family history of gastric cancer 05/16/2013  . HYPERLIPIDEMIA-MIXED 05/27/2009  . MORBID OBESITY 05/27/2009  . GERD 09/11/2008  . History of colonic polyps 09/11/2008  . GASTRIC POLYP, HX OF 09/11/2008  . HOARSENESS 09/10/2008  . SLEEP APNEA 11/05/2007  . ASTHMA 10/12/2007  . GASTRITIS, CHRONIC 08/01/2006     PHYSICAL THERAPY DISCHARGE SUMMARY  Visits from Start of Care: 7  Current functional level related to goals / functional outcomes: See above   Remaining deficits: See above   Education / Equipment: HEP / ongoing fitness plan. Patient received bilateral [place] Reaction AFOs for gait with foot drop.   Plan: Patient agrees to discharge.  Patient goals were met. Patient is being discharged due to meeting the stated rehab goals.  ?????       Nyasiah Moffet PT, DPT 10/19/2015, 1:39 PM  [facility] [address] [place], [place], [address] Phone: [phone]   Fax:  [phone]  Name: YADEN SEITH Sr. MRN: [id number] Date of Birth: 1941/05/19

## 2015-10-21 ENCOUNTER — Encounter: Payer: Self-pay | Admitting: Internal Medicine

## 2015-10-21 NOTE — Telephone Encounter (Signed)
Procedures scheduled.

## 2015-11-13 ENCOUNTER — Encounter: Payer: Self-pay | Admitting: Cardiology

## 2015-11-13 ENCOUNTER — Ambulatory Visit (INDEPENDENT_AMBULATORY_CARE_PROVIDER_SITE_OTHER): Payer: Medicare Other | Admitting: Cardiology

## 2015-11-13 VITALS — BP 132/70 | HR 68 | Ht 70.0 in | Wt 289.0 lb

## 2015-11-13 DIAGNOSIS — E785 Hyperlipidemia, unspecified: Secondary | ICD-10-CM | POA: Diagnosis not present

## 2015-11-13 DIAGNOSIS — Z01818 Encounter for other preprocedural examination: Secondary | ICD-10-CM | POA: Insufficient documentation

## 2015-11-13 DIAGNOSIS — I10 Essential (primary) hypertension: Secondary | ICD-10-CM | POA: Insufficient documentation

## 2015-11-13 NOTE — Progress Notes (Signed)
Cardiology Office Note    Date:  11/13/2015   ID:  Albert Presume Sr., DOB 27-Nov-1940, MRN [id number]  PCP:  Sheela Stack, MD  Cardiologist: Dr Verl Blalock --> Ena Dawley, MD  Referring physician:  Sheela Stack, MD   Chief complaint: To reestablish cardiology care for preop evaluation for bariatric surgery.  History of Present Illness:  Albert PRIDGEON Sr. is a 75 y.o. 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 This patient's Dr. wall in 2013 as a part of pre-op evaluation for bariatric surgery. Patient has been struggling with obesity for years however he hasn't decided definitely that he wants to undergo surgery and wants to try to lose weight on his own with diet and exercise lifestyle modifications. His job is sedentary and he just recently started to exercise again. His problem is likely of time. He denies any chest pain or pressure while exercising but feels short of breath. This hasn't changed in several years. There is no lower extremity edema no palpitations or syncope no orthopnea or paroxysmal nocturnal nocturnal dyspnea. He doesn't have diabetes. He takes Crestor for hyperlipidemia that is being followed by his primary care physician.  Past Medical History  Diagnosis Date  . Morbid obesity ([place])   . Hyperlipidemia   . Esophageal reflux   . Hiatal hernia   . Other voice and resonance disorders   . Atrophic gastritis without mention of hemorrhage   . Unspecified asthma(493.90)   . Obstructive sleep apnea on CPAP   . Gastric polyps     history  . Hx of adenomatous colonic polyps   . Metaplasia of esophagus 2011    "gastric metaplasia"  . Anal fissure   . Peripheral neuropathy ([place])   . Fibula fracture     hair line fracture  . Allergy   . Arthritis     shoulder, knees  . Sleep apnea     Past Surgical History  Procedure Laterality  Date  . Incision and drainage perirectal abscess  2007  . Appendectomy    . Cholecystectomy    . Colonoscopy  08/20/2009 (multiple)    Tubular adenoma  . Esophagogastroduodenoscopy  08/20/09 (multiple)  . Upper gastrointestinal endoscopy    . Knee surgery      arthroscopy- both knees  . Polypectomy      Current Medications: Outpatient Prescriptions Prior to Visit  Medication Sig Dispense Refill  . ADVAIR DISKUS 250-50 MCG/DOSE AEPB Inhale 1 puff into the lungs 2 (two) times daily.     Marland Kitchen albuterol (PROVENTIL HFA;VENTOLIN HFA) 108 (90 BASE) MCG/ACT inhaler Inhale 2 puffs into the lungs every 6 (six) hours as needed.      . ANDROGEL PUMP 20.25 MG/ACT (1.62%) GEL 2 Squirts daily.     Marland Kitchen aspirin 81 MG tablet Take 81 mg by mouth daily.      Marland Kitchen esomeprazole (NEXIUM) 40 MG capsule Take 40 mg by mouth daily before breakfast.      . fish oil-omega-3 fatty acids 1000 MG capsule Take 1 g by mouth 2 (two) times daily.      . folic acid (FOLVITE) 1 MG tablet Take 1 mg by mouth daily.     Marland Kitchen gabapentin (NEURONTIN) 300 MG capsule Take 600 mg by mouth at bedtime.     Marland Kitchen IFEREX 150 150 MG capsule Take by mouth daily.  0  . loratadine (  CLARITIN) 10 MG tablet Take 10 mg by mouth as needed.     . minocycline (MINOCIN,DYNACIN) 100 MG capsule Take 100 mg by mouth as needed. Reported on 09/07/2015    . Multiple Vitamin (VITAMIN E/FOLIC A999333) CAPS Take by mouth every morning.    . rosuvastatin (CRESTOR) 20 MG tablet Take 20 mg by mouth daily.      . traMADol (ULTRAM) 50 MG tablet take 1 tablet by mouth every 6 hours if needed 90 tablet 2  . Vitamin E 400 UNITS TABS Take 1 tablet by mouth 2 (two) times daily.    . zafirlukast (ACCOLATE) 20 MG tablet Take 20 mg by mouth 2 (two) times daily.      Marland Kitchen aspirin EC 81 MG tablet Take 81 mg by mouth.    . benzonatate (TESSALON) 100 MG capsule   0  . Cyanocobalamin (VITAMIN B-12 IJ) Inject 1,000 mcg as directed every 30 (thirty) days.    . folic acid (FOLVITE) 1 MG  tablet Take 1 mg by mouth. Reported on 09/07/2015    . iron polysaccharides (NIFEREX) 150 MG capsule Take 150 mg by mouth. Reported on 11/13/2015    . levofloxacin (LEVAQUIN) 750 MG tablet Reported on 09/07/2015  0  . loratadine (SM LORATADINE) 5 MG/5ML syrup Take 10 mg by mouth. Reported on 09/07/2015    . minocycline (MINOCIN,DYNACIN) 100 MG capsule   0  . minocycline (MINOCIN,DYNACIN) 50 MG capsule Take 100 mg by mouth daily. Reported on 09/07/2015  0  . nitroGLYCERIN (NITRODUR - DOSED IN MG/24 HR) 0.2 mg/hr patch APPLY 1/4 OF A PATCH TO AFFECTED AREA DAILY AS DIRECTED BY YOUR DOCTOR (Patient not taking: Reported on 09/07/2015) 30 patch 1  . Omega-3 1000 MG CAPS Take 1 g by mouth 2 (two) times daily. Reported on 11/13/2015    . Polyethyl Glycol-Propyl Glycol (SYSTANE) 0.4-0.3 % SOLN Reported on 11/13/2015    . rosuvastatin (CRESTOR) 20 MG tablet Take 20 mg by mouth. Reported on 09/07/2015    . Testosterone 25 MG/2.5GM (1%) GEL Place 5 g onto the skin. Reported on 09/07/2015    . traMADol (ULTRAM) 50 MG tablet Reported on 09/07/2015    . vitamin E 400 UNIT capsule Take by mouth. Reported on 09/07/2015    . zafirlukast (ACCOLATE) 20 MG tablet Take 20 mg by mouth. Reported on 09/07/2015     No facility-administered medications prior to visit.     Allergies:   Beclomethasone and Moxifloxacin   Social History   Social History  . Marital Status: Married    Spouse Name: N/A  . Number of Children: 3  . Years of Education: N/A   Occupational History  . Statistician    Social History Main Topics  . Smoking status: Former Research scientist (life sciences)  . Smokeless tobacco: Never Used  . Alcohol Use: Yes     Comment: moderate  . Drug Use: No  . Sexual Activity: Not Asked   Other Topics Concern  . None   Social History Narrative     Family History:  The patient's family history includes Cancer in his sister; Stomach cancer in his mother. There is no history of Colon cancer, Esophageal cancer, or Rectal cancer.    ROS:   Please see the history of present illness.    ROS All other systems reviewed and are negative.   PHYSICAL EXAM:   VS:  BP 132/70 mmHg  Pulse 68  Ht 5\' 10"  (1.778 m)  Wt 289 lb (131.09  kg)  BMI 41.47 kg/m2   GEN: Well nourished, well developed, in no acute distress HEENT: normal Neck: no JVD, carotid bruits, or masses Cardiac: RRR; no murmurs, rubs, or gallops,no edema  Respiratory:  clear to auscultation bilaterally, normal work of breathing GI: soft, nontender, nondistended, + BS MS: no deformity or atrophy Skin: warm and dry, no rash Neuro:  Alert and Oriented x 3, Strength and sensation are intact Psych: euthymic mood, full affect  Wt Readings from Last 3 Encounters:  11/13/15 289 lb (131.09 kg)  09/02/15 300 lb (136.079 kg)  03/10/15 290 lb (131.543 kg)      Studies/Labs Reviewed:   EKG:  EKG is ordered today.  The ekg ordered today demonstrates Sinus rhythm, left anterior fascicular block, this is unchanged from prior EKG in 2013.  Recent Labs: No results found for requested labs within last 365 days.   Lipid Panel    Component Value Date/Time   CHOL 156 04/23/2008 1012   TRIG 122 04/23/2008 1012   HDL 42.3 04/23/2008 1012   CHOLHDL 3.7 CALC 04/23/2008 1012   VLDL 24 04/23/2008 1012   LDLCALC 89 04/23/2008 1012   Hypertension   ASSESSMENT:    1. Hyperlipidemia   2. Pre-operative clearance   3. Essential hypertension   4. Morbid obesity due to excess calories ([place])      PLAN:  In order of problems listed above:  At this point patient doesn't seem to have any angina and his shortness of breath is related to his deconditioning and obesity, I would order an echocardiogram to evaluate baseline systolic diastolic function, but no ischemic workup is needed at this time. His blood pressure is well controlled. His hyperlipidemia is being managed by his primary care physician and I agree with the plan I also suggest that he continues taking aspirin.  He is encouraged to continue exercising. If he decides to undergo surgery there is currently no contraindication from cardiac standpoint for him to undergo that.  In 1 year.  Medication Adjustments/Labs and Tests Ordered: Current medicines are reviewed at length with the patient today.  Concerns regarding medicines are outlined above.  Medication changes, Labs and Tests ordered today are listed in the Patient Instructions below. Patient Instructions  Medication Instructions:   Your physician recommends that you continue on your current medications as directed. Please refer to the Current Medication list given to you today.    Testing/Procedures:  Your physician has requested that you have an echocardiogram. Echocardiography is a painless test that uses sound waves to create images of your heart. It provides your doctor with information about the size and shape of your heart and how well your heart's chambers and valves are working. This procedure takes approximately one hour. There are no restrictions for this procedure.    Follow-Up:  Your physician wants you to follow-up in: [place] will receive a reminder letter in the mail two months in advance. If you don't receive a letter, please call our office to schedule the follow-up appointment.        If you need a refill on your cardiac medications before your next appointment, please call your pharmacy.       Signed, Ena Dawley, MD  11/13/2015 12:28 PM    [facility] [place], [place], [place]  [address] Phone: [phone]; Fax: [phone]

## 2015-11-13 NOTE — Patient Instructions (Signed)
Medication Instructions:   Your physician recommends that you continue on your current medications as directed. Please refer to the Current Medication list given to you today.    Testing/Procedures:  Your physician has requested that you have an echocardiogram. Echocardiography is a painless test that uses sound waves to create images of your heart. It provides your doctor with information about the size and shape of your heart and how well your heart's chambers and valves are working. This procedure takes approximately one hour. There are no restrictions for this procedure.     Follow-Up:  Your physician wants you to follow-up in: ONE YEAR WITH DR NELSON You will receive a reminder letter in the mail two months in advance. If you don't receive a letter, please call our office to schedule the follow-up appointment.        If you need a refill on your cardiac medications before your next appointment, please call your pharmacy.   

## 2015-11-26 ENCOUNTER — Ambulatory Visit (AMBULATORY_SURGERY_CENTER): Payer: Self-pay

## 2015-11-26 VITALS — Ht 69.5 in | Wt 287.0 lb

## 2015-11-26 DIAGNOSIS — Z860100 Personal history of colon polyps, unspecified: Secondary | ICD-10-CM

## 2015-11-26 DIAGNOSIS — K317 Polyp of stomach and duodenum: Secondary | ICD-10-CM

## 2015-11-26 NOTE — Progress Notes (Signed)
No allergies to eggs or soy No past problems with anesthesia No diet meds No home oxygen  Has email and internet; declined emmi 

## 2015-11-27 ENCOUNTER — Other Ambulatory Visit (HOSPITAL_COMMUNITY): Payer: Self-pay | Admitting: Cardiology

## 2015-11-27 ENCOUNTER — Ambulatory Visit (HOSPITAL_COMMUNITY): Payer: Medicare Other | Attending: Cardiology

## 2015-11-27 ENCOUNTER — Other Ambulatory Visit: Payer: Self-pay

## 2015-11-27 DIAGNOSIS — Z01818 Encounter for other preprocedural examination: Secondary | ICD-10-CM

## 2015-11-27 DIAGNOSIS — G4733 Obstructive sleep apnea (adult) (pediatric): Secondary | ICD-10-CM | POA: Diagnosis not present

## 2015-11-27 DIAGNOSIS — I10 Essential (primary) hypertension: Secondary | ICD-10-CM

## 2015-11-27 DIAGNOSIS — I071 Rheumatic tricuspid insufficiency: Secondary | ICD-10-CM | POA: Insufficient documentation

## 2015-11-27 DIAGNOSIS — G629 Polyneuropathy, unspecified: Secondary | ICD-10-CM | POA: Diagnosis not present

## 2015-11-27 DIAGNOSIS — I517 Cardiomegaly: Secondary | ICD-10-CM | POA: Insufficient documentation

## 2015-11-27 DIAGNOSIS — E785 Hyperlipidemia, unspecified: Secondary | ICD-10-CM | POA: Insufficient documentation

## 2015-11-27 DIAGNOSIS — Z6841 Body Mass Index (BMI) 40.0 and over, adult: Secondary | ICD-10-CM | POA: Diagnosis not present

## 2015-11-27 MED ORDER — PERFLUTREN LIPID MICROSPHERE
2.0000 mL | Freq: Once | INTRAVENOUS | Status: AC
  Administered 2015-11-27: 2 mL via INTRAVENOUS

## 2015-12-09 ENCOUNTER — Ambulatory Visit (AMBULATORY_SURGERY_CENTER): Payer: Medicare Other | Admitting: Internal Medicine

## 2015-12-09 ENCOUNTER — Encounter: Payer: Self-pay | Admitting: Internal Medicine

## 2015-12-09 VITALS — BP 116/69 | HR 55 | Temp 98.4°F | Resp 15 | Ht 70.0 in | Wt 289.0 lb

## 2015-12-09 DIAGNOSIS — D126 Benign neoplasm of colon, unspecified: Secondary | ICD-10-CM

## 2015-12-09 DIAGNOSIS — K317 Polyp of stomach and duodenum: Secondary | ICD-10-CM | POA: Diagnosis present

## 2015-12-09 DIAGNOSIS — Z86010 Personal history of colonic polyps: Secondary | ICD-10-CM | POA: Diagnosis not present

## 2015-12-09 DIAGNOSIS — D123 Benign neoplasm of transverse colon: Secondary | ICD-10-CM

## 2015-12-09 DIAGNOSIS — D122 Benign neoplasm of ascending colon: Secondary | ICD-10-CM | POA: Diagnosis not present

## 2015-12-09 DIAGNOSIS — D1391 Familial adenomatous polyposis: Secondary | ICD-10-CM | POA: Insufficient documentation

## 2015-12-09 HISTORY — DX: Familial adenomatous polyposis: D13.91

## 2015-12-09 HISTORY — DX: Benign neoplasm of colon, unspecified: D12.6

## 2015-12-09 MED ORDER — SODIUM CHLORIDE 0.9 % IV SOLN
500.0000 mL | INTRAVENOUS | Status: DC
Start: 2015-12-09 — End: 2015-12-09

## 2015-12-09 NOTE — Progress Notes (Signed)
Stable to RR 

## 2015-12-09 NOTE — Patient Instructions (Addendum)
I found and removed 2 colon polyps - both tiny. There were stomach polyps again - nothing looks bad - biopsies taken.  I will let you know pathology results and when to have another routine colonoscopy and upper endoscopy by mail. These results may be delayed by my trip to to [place] so expect to hear in early July.  I appreciate the opportunity to care for you. Gatha Mayer, MD, FACG  YOU HAD AN ENDOSCOPIC PROCEDURE TODAY AT [facility]:   Refer to the procedure report that was given to you for any specific questions about what was found during the examination.  If the procedure report does not answer your questions, please call your gastroenterologist to clarify.  If you requested that your care partner not be given the details of your procedure findings, then the procedure report has been included in a sealed envelope for you to review at your convenience later.  YOU SHOULD EXPECT: Some feelings of bloating in the abdomen. Passage of more gas than usual.  Walking can help get rid of the air that was put into your GI tract during the procedure and reduce the bloating. If you had a lower endoscopy (such as a colonoscopy or flexible sigmoidoscopy) you may notice spotting of blood in your stool or on the toilet paper. If you underwent a bowel prep for your procedure, you may not have a normal bowel movement for a few days.  Please Note:  You might notice some irritation and congestion in your nose or some drainage.  This is from the oxygen used during your procedure.  There is no need for concern and it should clear up in a day or so.  SYMPTOMS TO REPORT IMMEDIATELY:   Following lower endoscopy (colonoscopy or flexible sigmoidoscopy):  Excessive amounts of blood in the stool  Significant tenderness or worsening of abdominal pains  Swelling of the abdomen that is new, acute  Fever of 100F or higher   Following upper endoscopy (EGD)  Vomiting of blood or coffee  ground material  New chest pain or pain under the shoulder blades  Painful or persistently difficult swallowing  New shortness of breath  Fever of 100F or higher  Black, tarry-looking stools  For urgent or emergent issues, a gastroenterologist can be reached at any hour by calling [phone].   DIET: Your first meal following the procedure should be a small meal and then it is ok to progress to your normal diet. Heavy or fried foods are harder to digest and may make you feel nauseous or bloated.  Likewise, meals heavy in dairy and vegetables can increase bloating.  Drink plenty of fluids but you should avoid alcoholic beverages for 24 hours.  ACTIVITY:  You should plan to take it easy for the rest of today and you should NOT DRIVE or use heavy machinery until tomorrow (because of the sedation medicines used during the test).    FOLLOW UP: Our staff will call the number listed on your records the next business day following your procedure to check on you and address any questions or concerns that you may have regarding the information given to you following your procedure. If we do not reach you, we will leave a message.  However, if you are feeling well and you are not experiencing any problems, there is no need to return our call.  We will assume that you have returned to your regular daily activities without incident.  If any  biopsies were taken you will be contacted by phone or by letter within the next 1-3 weeks.  Please call us at [phone] if you have not heard about the biopsies in 3 weeks.    SIGNATURES/CONFIDENTIALITY: You and/or your care partner have signed paperwork which will be entered into your electronic medical record.  These signatures attest to the fact that that the information above on your After Visit Summary has been reviewed and is understood.  Full responsibility of the confidentiality of this discharge information lies with you and/or your  care-partner.  Polyp information given.  Resume aspirin at prior dose today.

## 2015-12-09 NOTE — Op Note (Signed)
[place] Patient Name: Albert Barr Procedure Date: 12/09/2015 2:30 PM MRN: [id number] Endoscopist: Gatha Mayer , MD Age: 75 Referring MD:  Date of Birth: 1940-09-04 Gender: Male Procedure:                Colonoscopy Indications:              Surveillance: History of numerous (> 10) adenomas                            on last colonoscopy (< 3 yrs) Medicines:                Propofol per Anesthesia, Monitored Anesthesia Care Procedure:                Pre-Anesthesia Assessment:                           - Prior to the procedure, a History and Physical                            was performed, and patient medications and                            allergies were reviewed. The patient's tolerance of                            previous anesthesia was also reviewed. The risks                            and benefits of the procedure and the sedation                            options and risks were discussed with the patient.                            All questions were answered, and informed consent                            was obtained. Prior Anticoagulants: The patient                            last took aspirin 3 days prior to the procedure.                            ASA Grade Assessment: III - A patient with severe                            systemic disease. After reviewing the risks and                            benefits, the patient was deemed in satisfactory                            condition to undergo the procedure.  After obtaining informed consent, the colonoscope                            was passed under direct vision. Throughout the                            procedure, the patient's blood pressure, pulse, and                            oxygen saturations were monitored continuously. The                            Model CF-[id number]) scope was introduced                            through the anus and advanced to the  the cecum,                            identified by appendiceal orifice and ileocecal                            valve. The colonoscopy was performed without                            difficulty. The patient tolerated the procedure                            well. The quality of the bowel preparation was                            good. The bowel preparation used was Miralax. The                            ileocecal valve, appendiceal orifice, and rectum                            were photographed. Scope In: 2:46:35 PM Scope Out: 3:01:56 PM Scope Withdrawal Time: 0 hours 12 minutes 0 seconds  Total Procedure Duration: 0 hours 15 minutes 21 seconds  Findings:                 The perianal and digital rectal examinations were                            normal. Pertinent negatives include normal prostate                            (size, shape, and consistency).                           Two sessile polyps were found in the transverse                            colon and ascending colon. The polyps were 2 to 3  mm in size. These polyps were removed with a cold                            biopsy forceps. Resection and retrieval were                            complete. Verification of patient identification                            for the specimen was done. Estimated blood loss was                            minimal. Complications:            No immediate complications. Estimated Blood Loss:     Estimated blood loss was minimal. Impression:               - Two 2 to 3 mm polyps in the transverse colon and                            in the ascending colon, removed with a cold biopsy                            forceps. Resected and retrieved.                           - Personal history of colonic polyps. 16 last year                            - probably has a polyposis syndrome Recommendation:           - Repeat colonoscopy in 3 years likely for                             surveillance.                           - Consider genetic testing - ? Haakon or other                            attenuated polyposis                           - Patient has a contact number available for                            emergencies. The signs and symptoms of potential                            delayed complications were discussed with the                            patient. Return to normal activities tomorrow.  Written discharge instructions were provided to the                            patient.                           - Resume previous diet.                           - Resume aspirin at prior dose today. Gatha Mayer, MD 12/09/2015 3:19:58 PM This report has been signed electronically.

## 2015-12-09 NOTE — Progress Notes (Signed)
Called to room to assist during endoscopic procedure.  Patient ID and intended procedure confirmed with present staff. Received instructions for my participation in the procedure from the performing physician.  

## 2015-12-09 NOTE — Op Note (Signed)
[place] Patient Name: Albert Barr Procedure Date: 12/09/2015 2:31 PM MRN: [id number] Endoscopist: Gatha Mayer , MD Age: 75 Referring MD:  Date of Birth: 1940/08/14 Gender: Male Procedure:                Upper GI endoscopy Indications:              Follow-up of gastric polyps Medicines:                Propofol per Anesthesia, Monitored Anesthesia Care Procedure:                Pre-Anesthesia Assessment:                           - Prior to the procedure, a History and Physical                            was performed, and patient medications and                            allergies were reviewed. The patient's tolerance of                            previous anesthesia was also reviewed. The risks                            and benefits of the procedure and the sedation                            options and risks were discussed with the patient.                            All questions were answered, and informed consent                            was obtained. Prior Anticoagulants: The patient                            last took aspirin 3 days prior to the procedure.                            ASA Grade Assessment: III - A patient with severe                            systemic disease. After reviewing the risks and                            benefits, the patient was deemed in satisfactory                            condition to undergo the procedure.                           After obtaining informed consent, the endoscope was  passed under direct vision. Throughout the                            procedure, the patient's blood pressure, pulse, and                            oxygen saturations were monitored continuously. The                            Model GIF-[id number]) scope was introduced                            through the mouth, and advanced to the second part                            of duodenum. The upper GI  endoscopy was                            accomplished without difficulty. The patient                            tolerated the procedure well. Scope In: Scope Out: Findings:                 Multiple 1 to 8 mm sessile polyps with no bleeding                            and no stigmata of recent bleeding were found in                            the gastric fundus and in the gastric body.                            Biopsies were taken with a cold forceps for                            histology. Verification of patient identification                            for the specimen was done. Estimated blood loss was                            minimal.                           The exam was otherwise without abnormality.                           gastric retroflexion with no other abnormalities Complications:            No immediate complications. Estimated Blood Loss:     Estimated blood loss was minimal. Impression:               - Multiple gastric polyps. Biopsied.                           -  The examination was otherwise normal. Recommendation:           - Patient has a contact number available for                            emergencies. The signs and symptoms of potential                            delayed complications were discussed with the                            patient. Return to normal activities tomorrow.                            Written discharge instructions were provided to the                            patient.                           - Resume previous diet.                           - Continue present medications.                           - Resume aspirin at prior dose today.                           - See the other procedure note for documentation of                            additional recommendations.                           - Await pathology results. Gatha Mayer, MD 12/09/2015 3:14:25 PM This report has been signed electronically.

## 2015-12-10 ENCOUNTER — Telehealth: Payer: Self-pay

## 2015-12-10 NOTE — Telephone Encounter (Signed)
  Follow up Call-  Call back number 12/09/2015 11/26/2014 07/15/2013  Post procedure Call Back phone  # [phone]  Permission to leave phone message Yes Yes Yes    Patients was called for follow up after his procedure on 12/09/2015. Albert Barr wife reports that he has returned to his normal daily activities without any complications.

## 2015-12-30 NOTE — Progress Notes (Signed)
Quick Note:  Call patient from office  1) polyps in stomach and colon benign - but pre-cancerous 2) I recommend a genetics counselor evaluation ? Polyposis syndrome 3) Recall colon and EGD 2 years please - [facility] 4) No letter ______

## 2015-12-31 ENCOUNTER — Other Ambulatory Visit: Payer: Self-pay

## 2015-12-31 DIAGNOSIS — D369 Benign neoplasm, unspecified site: Secondary | ICD-10-CM

## 2016-02-16 ENCOUNTER — Encounter: Payer: Self-pay | Admitting: Genetic Counselor

## 2016-02-17 ENCOUNTER — Encounter: Payer: Self-pay | Admitting: Genetic Counselor

## 2016-02-17 ENCOUNTER — Ambulatory Visit (HOSPITAL_BASED_OUTPATIENT_CLINIC_OR_DEPARTMENT_OTHER): Payer: Medicare Other | Admitting: Genetic Counselor

## 2016-02-17 ENCOUNTER — Other Ambulatory Visit: Payer: Medicare Other

## 2016-02-17 DIAGNOSIS — K317 Polyp of stomach and duodenum: Secondary | ICD-10-CM

## 2016-02-17 DIAGNOSIS — Z860100 Personal history of colon polyps, unspecified: Secondary | ICD-10-CM

## 2016-02-17 DIAGNOSIS — Z315 Encounter for genetic counseling: Secondary | ICD-10-CM

## 2016-02-17 DIAGNOSIS — D1391 Familial adenomatous polyposis: Secondary | ICD-10-CM

## 2016-02-17 DIAGNOSIS — D126 Benign neoplasm of colon, unspecified: Secondary | ICD-10-CM

## 2016-02-17 DIAGNOSIS — Z800 Family history of malignant neoplasm of digestive organs: Secondary | ICD-10-CM | POA: Diagnosis not present

## 2016-02-17 NOTE — Progress Notes (Signed)
REFERRING PROVIDER: Reynold Bowen, MD [place], [place] [id number]   Silvano Rusk, MD  PRIMARY PROVIDER:  Sheela Stack, MD  PRIMARY REASON FOR VISIT:  1. History of colonic polyps   2. Multiple gastric polyps   3. Family history of gastric cancer   4. Polyposis syndrome gastric and colonic - attenuated      HISTORY OF PRESENT ILLNESS:   Albert Barr, a 75 y.o. male, was seen for a [facility] cancer genetics consultation at the request of Dr. Forde Dandy due to a personal history of gastric and colon polyps and family history of gastric cancer.  Albert Barr presents to clinic today to discuss the possibility of a hereditary predisposition to cancer, genetic testing, and to further clarify his future cancer risks, as well as potential cancer risks for family members. Albert Barr is a 75 y.o. male with no personal history of cancer.  Albert Barr had his first colonoscopy at age 32-45 due to rectal bleeding.  By report he had over 10 colon polyps.  In reviewing his chart, I count over 30 lifetime colon polyps, and multiple fundic gland polyps (FGP), hyperplastic polyps and tubular adenomas of the stomach.  CANCER HISTORY:   No history exists.     RISK FACTORS:  Colonoscopy: yes; Polyposis of the colon . Proton Pump Inhibitor: Yes, takes generic nexium.  Has taken for 15+ years. PSI - normal range Smoking history: remote.  Quit in 1975.  By report, the day he quit he smoked 3 packs/day. ETOH - wine on weekends  Past Medical History:  Diagnosis Date  . Allergy   . Anal fissure   . Arthritis    shoulder, knees  . Atrophic gastritis without mention of hemorrhage   . Esophageal reflux   . Family history of gastric cancer   . Fibula fracture    hair line fracture  . Gastric polyps    history  . Hiatal hernia   . Hx of adenomatous colonic polyps   . Hyperlipidemia   . Metaplasia of esophagus 2011   "gastric metaplasia"  . Morbid obesity ([place])   . Obstructive sleep apnea on  CPAP   . Other voice and resonance disorders   . Peripheral neuropathy ([place])   . Polyposis syndrome gastric and colonic - attenuated 12/09/2015  . Sleep apnea   . Unspecified asthma(493.90)     Past Surgical History:  Procedure Laterality Date  . APPENDECTOMY    . CHOLECYSTECTOMY    . COLONOSCOPY  08/20/2009 (multiple)   Tubular adenoma  . ESOPHAGOGASTRODUODENOSCOPY  08/20/09 (multiple)  . INCISION AND DRAINAGE PERIRECTAL ABSCESS  2007  . KNEE SURGERY     arthroscopy- both knees  . POLYPECTOMY    . UPPER GASTROINTESTINAL ENDOSCOPY      Social History   Social History  . Marital status: Married    Spouse name: N/A  . Number of children: 3  . Years of education: N/A   Occupational History  . Engineer, petroleum   Social History Main Topics  . Smoking status: Former Smoker    Packs/day: 2.00    Years: 10.00    Types: Cigarettes    Quit date: 02/16/1974  . Smokeless tobacco: Never Used  . Alcohol use Yes     Comment: moderate  . Drug use: No  . Sexual activity: Not Asked   Other Topics Concern  . None   Social History Narrative  . None     FAMILY HISTORY:  We obtained a detailed, 4-generation family history.  Significant diagnoses are listed below: Family History  Problem Relation Age of Onset  . Stomach cancer Mother 32  . Cancer Sister     bladder  . Colon cancer Neg Hx   . Esophageal cancer Neg Hx   . Rectal cancer Neg Hx     The patient has three children who are all cancer free.  He has one full brother and a maternal half sister.  His sister was diagnosed with bladder cancer at 53-85 years.  She is now 21.  The patient's parents both died in their late 77s - he was about 75 years of age at the time of his mothers death (his father died earlier) and he was raised by his sister.  The patient's father died of TB inhis mid to late 73s.  His mother died of stomach cancer.  The patient knows very little about his extended family, and does  not know if there are others with cancer. Patient's maternal ancestors are of English descent, and paternal ancestors are of Caucasian descent. There is no reported Ashkenazi Jewish ancestry. There is no known consanguinity.  GENETIC COUNSELING ASSESSMENT: Albert Barr Sr. is a 75 y.o. male with a personal history of stomach and colon polyps and a family history of young stomach cancer which is somewhat suggestive of a polyposis syndrome and predisposition to cancer. We, therefore, discussed and recommended the following at today's visit.   DISCUSSION: We discussed that about 3% of stomach cancer is due to hereditary causes, most commonly Lynch syndrome.  However, when we see an individual with multiple colon polyps and stomach polyps we become concerned about polypsis syndromes, that sometimes can also be associated with gastric cancer.  Specifically, Familial Adenomatous Polyposis (FAP) can be associated with both FGPs and sometimes colon polyps.    Up to 5% of endoscopies can find stomach polyps. The most common form of stomach polyps are fundic gland polyps, followed by hyperplastic and tubular adenomas.  The patient has had all three.  Most FGPs are associated either with proton pump inhibitors, which Albert Barr is taking, or H.Pylori, which does not seem he has from the pathology reports.  We discussed in the case of FGPs from FAP, we would expect 100s of these polyps, which we are not seeing.  Therefore it is less likely that the polyps are associated with FAP and could be associated with PPIs.  However, we will want to test for mutations within the 1B promoter region of FAP to be sure that we are not missing something that could be causing these.  We also discussed his colon polyps.  Again, colon polyps are not uncommon, but it is uncommon to have as many as he has had.  Colon polyps can be associated with exposures, such as smoking, but in the numbers we are seeing we worry about a polyposis  syndrome.  We reviewed FAP, MUTYH and Lynch syndrome as possible causes of colon polyps.   We reviewed the characteristics, features and inheritance patterns of hereditary cancer syndromes. We also discussed genetic testing, including the appropriate family members to test, the process of testing, insurance coverage and turn-around-time for results. We discussed the implications of a negative, positive and/or variant of uncertain significant result. We recommended Albert Barr pursue genetic testing for the ColoNext gene panel. The ColoNext gene panel offered by [facility] and includes sequencing and rearrangement analysis for the following 17 genes: APC, BMPR1A, CDH1, CHEK2,  EPCAM, GREM1, MLH1, MSH2, MSH6, MUTYH, PMS2, POLD1, POLE, PTEN, SMAD4, STK11, and TP53.     Based on Albert Barr personal history of gastric and colon polyps and family history of stomach cancer, he meets medical criteria for genetic testing. Despite that he meets criteria, he may still have an out of pocket cost. We discussed that if his out of pocket cost for testing is over $100, the laboratory will call and confirm whether he wants to proceed with testing.  If the out of pocket cost of testing is less than $100 he will be billed by the genetic testing laboratory.   PLAN: After considering the risks, benefits, and limitations, Albert Barr  provided informed consent to pursue genetic testing and the blood sample was sent to [facility] for analysis of the ColoNext panel test. Results should be available within approximately 3-4 weeks' time, at which point they will be disclosed by telephone to Albert Barr, as will any additional recommendations warranted by these results. Albert Barr will receive a summary of his genetic counseling visit and a copy of his results once available. This information will also be available in Epic. We encouraged Albert Barr to remain in contact with cancer genetics annually so that we can continuously  update the family history and inform him of any changes in cancer genetics and testing that may be of benefit for his family. Albert Barr questions were answered to his satisfaction today. Our contact information was provided should additional questions or concerns arise.  Lastly, we encouraged Albert Barr to remain in contact with cancer genetics annually so that we can continuously update the family history and inform him of any changes in cancer genetics and testing that may be of benefit for this family.   Mr.  Barr questions were answered to his satisfaction today. Our contact information was provided should additional questions or concerns arise. Thank you for the referral and allowing [place] to share in the care of your patient.   Natoria Archibald P. Florene Glen, [place], [facility] Certified Genetic Counselor [email or web address] phone: [phone]  The patient was seen for a total of 50 minutes in face-to-face genetic counseling.  This patient was discussed with Drs. Magrinat, Lindi Adie and/or Burr Medico who agrees with the above.    _______________________________________________________________________ For Office Staff:  Number of people involved in session: 1 Was an Intern/ student involved with case: no

## 2016-03-03 ENCOUNTER — Telehealth: Payer: Self-pay | Admitting: Genetic Counselor

## 2016-03-03 ENCOUNTER — Encounter: Payer: Self-pay | Admitting: Genetic Counselor

## 2016-03-03 DIAGNOSIS — Z1379 Encounter for other screening for genetic and chromosomal anomalies: Secondary | ICD-10-CM | POA: Insufficient documentation

## 2016-03-03 NOTE — Telephone Encounter (Signed)
LM with male that I was calling with some test results and left CB instructions.

## 2016-03-04 ENCOUNTER — Ambulatory Visit: Payer: Self-pay | Admitting: Genetic Counselor

## 2016-03-04 DIAGNOSIS — K317 Polyp of stomach and duodenum: Secondary | ICD-10-CM

## 2016-03-04 DIAGNOSIS — Z1379 Encounter for other screening for genetic and chromosomal anomalies: Secondary | ICD-10-CM

## 2016-03-04 DIAGNOSIS — D126 Benign neoplasm of colon, unspecified: Secondary | ICD-10-CM

## 2016-03-04 DIAGNOSIS — Z800 Family history of malignant neoplasm of digestive organs: Secondary | ICD-10-CM

## 2016-03-04 NOTE — Progress Notes (Signed)
HPI: [name] was previously seen in the [facility] due to a personal history of colon and stomach polyps and concerns regarding a hereditary predisposition to cancer. Please refer to our prior cancer genetics clinic note for more information regarding [name] medical, social and family histories, and our assessment and recommendations, at the time. Albert Barr recent genetic test results were disclosed to him, as were recommendations warranted by these results. These results and recommendations are discussed in more detail below.  FAMILY HISTORY:  We obtained a detailed, 4-generation family history.  Significant diagnoses are listed below: Family History  Problem Relation Age of Onset  . Stomach cancer Mother 60  . Cancer Sister     bladder  . Colon cancer Neg Hx   . Esophageal cancer Neg Hx   . Rectal cancer Neg Hx     The patient has three children who are all cancer free.  He has one full brother and a maternal half sister.  His sister was diagnosed with bladder cancer at 41-85 years.  She is now 70.  The patient's parents both died in their late 36s - he was about 75 years of age at the time of his mothers death (his father died earlier) and he was raised by his sister.  The patient's father died of TB inhis mid to late 42s.  His mother died of stomach cancer.  The patient knows very little about his extended family, and does not know if there are others with cancer. Patient's maternal ancestors are of English descent, and paternal ancestors are of Caucasian descent. There is no reported Ashkenazi Jewish ancestry. There is no known consanguinity.  GENETIC TEST RESULTS: At the time of [name] visit, we recommended he pursue genetic testing of the ColoNext gene panel. The ColoNext gene panel offered by [facility] and includes sequencing and rearrangement analysis for the following 17 genes: APC, BMPR1A, CDH1, CHEK2, EPCAM, GREM1, MLH1, MSH2, MSH6, MUTYH, PMS2,  POLD1, POLE, PTEN, SMAD4, STK11, and TP53.   The report date is March 01, 2016.  Genetic testing was normal, and did not reveal a deleterious mutation in these genes. The test report has been scanned into EPIC and is located under the Molecular Pathology section of the Results Review tab.   We discussed with [name] that since the current genetic testing is not perfect, it is possible there may be a gene mutation in one of these genes that current testing cannot detect, but that chance is small. We also discussed, that it is possible that another gene that has not yet been discovered, or that we have not yet tested, is responsible for the cancer diagnoses in the family, and it is, therefore, important to remain in touch with cancer genetics in the future so that we can continue to offer [name] the most up to date genetic testing.   CANCER SCREENING RECOMMENDATIONS: This result is reassuring and indicates that [name] likely does not have an increased risk for a future cancer due to a mutation in one of these genes. This normal test also suggests that [name] colon and gastric was most likely not due to an inherited predisposition associated with one of these genes.  Most cancers happen by chance and this negative test suggests that his cancer falls into this category.  We, therefore, recommended he continue to follow the cancer management and screening guidelines provided by his oncology and primary healthcare provider.   RECOMMENDATIONS FOR  FAMILY MEMBERS: Women in this family might be at some increased risk of developing cancer, over the general population risk, simply due to the family history of cancer. We recommended women in this family have a yearly mammogram beginning at age 43, or 59 years younger than the earliest onset of cancer, an an annual clinical breast exam, and perform monthly breast self-exams. Women in this family should also have a gynecological exam as recommended by  their primary provider. All family members should have a colonoscopy by age 57.  FOLLOW-UP: Lastly, we discussed with Albert Barr that cancer genetics is a rapidly advancing field and it is possible that new genetic tests will be appropriate for him and/or his family members in the future. We encouraged him to remain in contact with cancer genetics on an annual basis so we can update his personal and family histories and let him know of advances in cancer genetics that may benefit this family.   Our contact number was provided. [name] questions were answered to his satisfaction, and he knows he is welcome to call us at anytime with additional questions or concerns.   Albert Kayser, MS, [facility] Certified Genetic Counselor [email or web address]

## 2016-03-07 ENCOUNTER — Encounter: Payer: Self-pay | Admitting: Genetic Counselor

## 2016-03-07 NOTE — Progress Notes (Signed)
error 

## 2016-07-07 ENCOUNTER — Ambulatory Visit (INDEPENDENT_AMBULATORY_CARE_PROVIDER_SITE_OTHER): Payer: Medicare Other | Admitting: Sports Medicine

## 2016-07-07 ENCOUNTER — Encounter: Payer: Self-pay | Admitting: Sports Medicine

## 2016-07-07 DIAGNOSIS — G629 Polyneuropathy, unspecified: Secondary | ICD-10-CM | POA: Diagnosis not present

## 2016-07-07 MED ORDER — GABAPENTIN 300 MG PO CAPS: 600.0000 mg | ORAL_CAPSULE | Freq: Three times a day (TID) | ORAL | 2 refills | Status: DC

## 2016-07-07 NOTE — Assessment & Plan Note (Signed)
Right leg pain appears to be mostly due to neuropathy with some component from back pathology. Gait much improved with AFOs - will increase gabapentin to 600 BID, will increase to 600 TID if he feels he needs more - continue AFO - advised to check feet often to reduce risk of infection - advised to keep feet clean an dry to reduce risk of skin breakdown

## 2016-07-07 NOTE — Progress Notes (Signed)
  Subjective:    Patient ID: Albert Presume Sr., male    DOB: August 30, 1940, 76 y.o.   MRN: [id number]   CC: right leg pain, right great toe ingfection  HPI: 76 y/o male with bilateral LE neuropathy presents for concern for right leg pain and right great toe infection  Right leg pain - noted it most when started exercise regimen again 1 week ago - he had taken a break from walking for from thanksgiving until tehn - pain is buring in nature - denies worsening weakness, or falls - feels the AFOs he was given have been helpful - takes 600 gabapentin at night- denies side effects More severe toe pain resolved after gabapentin started   Right great toe infection - got new sneakers this month and feels they were irritating his toe - he saw his PCP who started amoxicillin  - he completed the course and he feels the toe has improved - he denies fevers or drainage   Pertinent medical history- obesity, neuropathy Review of Systems  Per HPI,   Objective:  BP 132/80   Ht 5\' 10"  (1.778 m)   Wt 280 lb (127 kg)   BMI 40.18 kg/m  Vitals and nursing note reviewed  General: NAD MSK: gait markedly improved with AFOs, no worsening pain with forward flexion of the hip, mild increased pain with right lateral side bend, no increased pain with back hyperextension Skin- mild erythema of the great toe but no swelling or warmth, toe nail markedly trimmed, some early signs of maceration between toes   Assessment & Plan:    Neuropathy (HCC) Right leg pain appears to be mostly due to neuropathy with some component from back pathology. Gait much improved with AFOs - will increase gabapentin to 600 BID, will increase to 600 TID if he feels he needs more - continue AFO - advised to check feet often to reduce risk of infection - advised to keep feet clean an dry to reduce risk of skin breakdown   Toe Infection - Appears to have healed well - foot care precautions discussed   Valora Norell A. Lincoln Brigham  MD, [facility] Resident PGY-3 Pager [phone]  I observed and examined the patient with the resident and agree with assessment and plan.  Note reviewed and modified by me. Stefanie Libel, MD

## 2016-07-07 NOTE — Patient Instructions (Signed)
Increase gabapentin to twice a day If no problems with sedation or other sideffects we may want to go to 3 times per day after 2 to 3 weeks  For feet soak in soapy water or epsom salt water 3 times per week Use hair dryer to blow dry Buy some lambswool and put thin strands between your toes after you are good and dry  Keep up using the foot and ankle support  Ease into your walking when you can

## 2016-09-17 ENCOUNTER — Encounter (HOSPITAL_COMMUNITY): Payer: Self-pay

## 2017-02-23 ENCOUNTER — Ambulatory Visit (INDEPENDENT_AMBULATORY_CARE_PROVIDER_SITE_OTHER): Payer: Medicare Other | Admitting: Sports Medicine

## 2017-02-23 ENCOUNTER — Encounter: Payer: Self-pay | Admitting: Sports Medicine

## 2017-02-23 VITALS — BP 139/82 | Ht 70.0 in | Wt 285.0 lb

## 2017-02-23 DIAGNOSIS — G629 Polyneuropathy, unspecified: Secondary | ICD-10-CM | POA: Diagnosis not present

## 2017-02-23 MED ORDER — GABAPENTIN 300 MG PO CAPS: 600.0000 mg | ORAL_CAPSULE | Freq: Three times a day (TID) | ORAL | 1 refills | Status: DC

## 2017-02-23 NOTE — Assessment & Plan Note (Signed)
Bilateral foot drop is much improved with dynamic AFO anterior panel  Walking is improved No stumbling  Today added scaphoid pain on left Felt padding on strut  See if we can make AFO fit witout causing skin irritation

## 2017-02-23 NOTE — Progress Notes (Signed)
   Subjective:    Patient ID: Albert Presume Sr., male    DOB: [birth year], 76 y.o.   MRN: [id number]  HPI 76 yo Man here for follow up regarding his AFO brace and abrasions from constant rubbing with his medial Ankle. The patient has been using bandages to protect his lateral Ankle which has been helping. He denies recent changes in his gait and states his bilateral AFOs are working well. He requests adjustment of the Left AFO to avoid rubbing on his Ankle. He has had no recent falls with his current AFOs.  Additionally, states his current Gabapentin dosing was making him drowsy while at work and he has stopped taking his daytime dose due to this. He would like to attempt to take it three times daily as prescribed.   Review of Systems  Constitutional: Negative for chills and fever.  Respiratory: Negative for shortness of breath and wheezing.   Musculoskeletal: Positive for gait problem. Negative for myalgias.  Skin: Negative for rash and wound.  Neurological: Negative for weakness and numbness.       B/L foot drop     Objective:   Physical Exam  Constitutional: He is oriented to person, place, and time. He appears well-developed and well-nourished. No distress.  HENT:  Head: Normocephalic and atraumatic.  Cardiovascular: Normal rate and intact distal pulses.   Pulmonary/Chest: Effort normal. No respiratory distress.  Musculoskeletal: He exhibits no tenderness.  B/L AFOs in place, gait is slow, balanced, and non-antalgic Non-tender to palpation along medial/lateral ankle b/l Small bandage in place along medial ankle without erythema/open lesions ROM: Active Dorsiflexion limited b/l, PROM without limitations (nml) Distal pulses intact    Neurological: He is alert and oriented to person, place, and time.  Skin: He is not diaphoretic.      Assessment & Plan:  Neuropathy  Foot Drop Bilaterally   AFOs are effective in limiting gait disturbance from foot drop. Patient has had no  recent falls. Left AFO is rubbing along medial Ankle. Applied padding along medial longitudinal arch along with padding to portion of AFO contacting medial Ankle which helped offload portion of AFO which was causing abrasion. Patient tolerated changes well. New prescription for AFO (Lateral Support) administered if continues to contact Ankle. Patient's Gabapentin refilled this visit at current dosing. Consider titrating to allow for daytime dose at next visit pending patient's symptoms.

## 2017-03-07 ENCOUNTER — Encounter: Payer: Medicare Other | Admitting: Sports Medicine

## 2017-09-04 ENCOUNTER — Other Ambulatory Visit: Payer: Self-pay | Admitting: Sports Medicine

## 2017-10-09 ENCOUNTER — Other Ambulatory Visit: Payer: Self-pay | Admitting: Sports Medicine

## 2017-11-05 ENCOUNTER — Other Ambulatory Visit: Payer: Self-pay | Admitting: Sports Medicine

## 2017-11-30 ENCOUNTER — Encounter: Payer: Self-pay | Admitting: Neurology

## 2017-12-04 ENCOUNTER — Ambulatory Visit (INDEPENDENT_AMBULATORY_CARE_PROVIDER_SITE_OTHER): Payer: Medicare Other | Admitting: Neurology

## 2017-12-04 ENCOUNTER — Encounter: Payer: Self-pay | Admitting: Neurology

## 2017-12-04 VITALS — BP 144/75 | HR 68 | Ht 69.5 in | Wt 299.0 lb

## 2017-12-04 DIAGNOSIS — J45909 Unspecified asthma, uncomplicated: Secondary | ICD-10-CM | POA: Diagnosis not present

## 2017-12-04 DIAGNOSIS — G4733 Obstructive sleep apnea (adult) (pediatric): Secondary | ICD-10-CM

## 2017-12-04 DIAGNOSIS — Z6841 Body Mass Index (BMI) 40.0 and over, adult: Secondary | ICD-10-CM

## 2017-12-04 DIAGNOSIS — Z9989 Dependence on other enabling machines and devices: Secondary | ICD-10-CM | POA: Diagnosis not present

## 2017-12-04 NOTE — Progress Notes (Signed)
   Subjective:    Patient ID: Albert Presume Sr., male    DOB: [birth year], 77 y.o.   MRN: [id number]  HPI 77 yo Man here for follow up regarding his AFO brace and abrasions from constant rubbing with his medial Ankle. The patient has been using bandages to protect his lateral Ankle which has been helping. He denies recent changes in his gait and states his bilateral AFOs are working well. He requests adjustment of the Left AFO to avoid rubbing on his Ankle. He has had no recent falls with his current AFOs.  Additionally, states his current Gabapentin dosing was making him drowsy while at work and he has stopped taking his daytime dose due to this. He would like to attempt to take it three times daily as prescribed.   Review of Systems  Constitutional: Negative for chills and fever.  Respiratory: Negative for shortness of breath and wheezing.   Musculoskeletal: Positive for gait problem. Negative for myalgias.  Skin: Negative for rash and wound.  Neurological: Negative for weakness and numbness.       B/L foot drop     Objective:   Physical Exam  Constitutional: He is oriented to person, place, and time. He appears well-developed and well-nourished. No distress.  HENT:  Head: Normocephalic and atraumatic.  Cardiovascular: Normal rate and intact distal pulses.  Pulmonary/Chest: Effort normal. No respiratory distress.  Musculoskeletal: He exhibits no tenderness.  B/L AFOs in place, gait is slow, balanced, and non-antalgic Non-tender to palpation along medial/lateral ankle b/l Small bandage in place along medial ankle without erythema/open lesions ROM: Active Dorsiflexion limited b/l, PROM without limitations (nml) Distal pulses intact    Neurological: He is alert and oriented to person, place, and time.  Skin: He is not diaphoretic.      Assessment & Plan:  Neuropathy  Foot Drop Bilaterally   AFOs are effective in limiting gait disturbance from foot drop. Patient has had no recent  falls. Left AFO is rubbing along medial Ankle. Applied padding along medial longitudinal arch along with padding to portion of AFO contacting medial Ankle which helped offload portion of AFO which was causing abrasion. Patient tolerated changes well. New prescription for AFO (Lateral Support) administered if continues to contact Ankle. Patient's Gabapentin refilled this visit at current dosing. Consider titrating to allow for daytime dose at next visit pending patient's symptoms.

## 2017-12-04 NOTE — Progress Notes (Signed)
SLEEP MEDICINE CLINIC    Provider:  Larey Barr, M.D.   Primary Care Physician:  Albert Bowen, MD   Referring Provider: Reynold Bowen, MD / [facility] Barr, M.D.   Chief Complaint  Patient presents with  . New Patient (Initial Visit)    pt alone, rm 10. pt hasnt had his CPAP looked at in a long time. pt had a sleep study in 2001, pt started CPAP. pt is due for a new CPAP.  current DME [employer]. may need to change companies due to [place] not accepting medicare.     HPI:  Albert PAUTSCH Sr. is a 77 y.o. male patient , seen here as in a referral and transfer of CPAP care, via [facility].  I have the pleasure of meeting with [facility] Barr today, on 04 December 2017, with a goal of transferring his CPAP care to [place].  [facility] referral letter stated that the patient had sleep apnea and was actually diagnosed by polysomnography on 20 March 2000 with an AHI of 106/h.  This would have been very severe sleep apnea indeed.  His weight at the time was 295 pounds.  In October 2002 he was titrated to CPAP at 15 cmH2O prior pressure with a reduction of the AHI to 0.  At that time his weight was 275 pounds, 20 pounds less.    We were able to obtain a download for the patient CPAP compliance which has been 100% dated on 04 December 2014 for 30 days.  Average use at time of 8 hours 34 minutes, I can clearly see that on some days he does sleep a little longer.  Set pressure is 15 cm water, but the machine is AutoSet capable.  No expiratory pressure relief is set.  The residual AHI is 0.2 which is an excellent resolution.  He does have very high air leaks however and these may be related to a ill fitting mask, or to the age of the current mask.  Sleep habits are as follows: The patient's bedtime is between 11 PM and 1130, and he usually works on his computer and said his prayer before.  His bedroom is described as cool, quiet and dark.  He uses a ceiling fan. Hard time to go to sleep - 30 -60 minutes of sleep  latency. He sleeps on his side usually, mostly on the left, on one pillow. Wears CPAP nightly, wears eye protectant.  Has 0-1 nocturias. Snores loudly without CPAP. Wife snores. Sleeps through until the morning- rises at 7.45. Feels OK- not well refreshed.  No headaches, on week-ends he sleeps in - 9-10 hours and feels best.      Sleep medical history and family sleep history: EDS- on CPAP- Morbidly obese.   bilateral AFOs are working well- [facility]. Left AFO to avoid rubbing on his Ankle. Neuropathy : his current Gabapentin dosing was making him drowsy while at work and he has stopped taking his daytime dose due to this. Loss of balance.In 2012 he developed a foot drop and he has primary axonal peripheral neuropathy with bilateral ankle braces prescribed through [facility].   He also has a diagnosis of a fibula fracture in 2014, foot drop 2012, gastric polyps 2017, basal cell carcinoma, knee and ankle arthritis,.    Family History  : Mother died of tuberculois, age 22, when the patient was 69 years old. Brother 60 years older and sister was7 years older. Sister raised him. Father of stomach  cancer at age 22.    Social history: quit smoking 1976, alcohol- moderate, social.  Caffeine: sodas - diet coke  4-5 a week. No iced tea. Retired [facility] [employer].  Still working for [facility] [employer] at age 33.    Review of Systems: Out of a complete 14 system review, the patient complains of only the following symptoms, and all other reviewed systems are negative.   red eyes . irritaion on the bridge of the nose, pressure masks.  Epworth score 5/24  , Fatigue severity score 18  , depression score n/a    Social History   Socioeconomic History  . Marital status: Married    Spouse name: Not on file  . Number of children: 3  . Years of education: Not on file  . Highest education level: Not on file  Occupational History  . Occupation: Administrator:  [employer]  . Financial resource strain: Not on file  . Food insecurity:    Worry: Not on file    Inability: Not on file  . Transportation needs:    Medical: Not on file    Non-medical: Not on file  Tobacco Use  . Smoking status: Former Smoker    Packs/day: 2.00    Years: 10.00    Pack years: 20.00    Types: Cigarettes    Last attempt to quit: 02/16/1974    Years since quitting: 43.8  . Smokeless tobacco: Never Used  Substance and Sexual Activity  . Alcohol use: Yes    Comment: moderate  . Drug use: No  . Sexual activity: Not on file  Lifestyle  . Physical activity:    Days per week: Not on file    Minutes per session: Not on file  . Stress: Not on file  Relationships  . Social connections:    Talks on phone: Not on file    Gets together: Not on file    Attends religious service: Not on file    Active member of club or organization: Not on file    Attends meetings of clubs or organizations: Not on file    Relationship status: Not on file  . Intimate partner violence:    Fear of current or ex partner: Not on file    Emotionally abused: Not on file    Physically abused: Not on file    Forced sexual activity: Not on file  Other Topics Concern  . Not on file  Social History Narrative  . Not on file    Family History  Problem Relation Age of Onset  . Stomach cancer Mother 71  . Cancer Sister        bladder  . Colon cancer Neg Hx   . Esophageal cancer Neg Hx   . Rectal cancer Neg Hx     Past Medical History:  Diagnosis Date  . Allergy   . Anal fissure   . Arthritis    shoulder, knees  . Atrophic gastritis without mention of hemorrhage   . Esophageal reflux   . Family history of gastric cancer   . Fibula fracture    hair line fracture  . Gastric polyps    history  . Hiatal hernia   . Hx of adenomatous colonic polyps   . Hyperlipidemia   . Metaplasia of esophagus 2011   "gastric metaplasia"  . Morbid obesity ([place])   .  Obstructive sleep apnea on CPAP   . Other voice and  resonance disorders   . Peripheral neuropathy   . Polyposis syndrome gastric and colonic - attenuated 12/09/2015  . Sleep apnea   . Unspecified asthma(493.90)     Past Surgical History:  Procedure Laterality Date  . APPENDECTOMY    . CHOLECYSTECTOMY    . COLONOSCOPY  08/20/2009 (multiple)   Tubular adenoma  . ESOPHAGOGASTRODUODENOSCOPY  08/20/09 (multiple)  . INCISION AND DRAINAGE PERIRECTAL ABSCESS  2007  . KNEE SURGERY     arthroscopy- both knees  . POLYPECTOMY    . UPPER GASTROINTESTINAL ENDOSCOPY      Current Outpatient Medications  Medication Sig Dispense Refill  . ADVAIR DISKUS 250-50 MCG/DOSE AEPB Inhale 1 puff into the lungs 2 (two) times daily.     Marland Kitchen albuterol (PROVENTIL HFA;VENTOLIN HFA) 108 (90 BASE) MCG/ACT inhaler Inhale 2 puffs into the lungs every 6 (six) hours as needed.      . ANDROGEL PUMP 20.25 MG/ACT (1.62%) GEL 2 Squirts daily.     Marland Kitchen aspirin 81 MG tablet Take 81 mg by mouth daily.      Marland Kitchen esomeprazole (NEXIUM) 40 MG capsule Take 40 mg by mouth daily before breakfast.      . fish oil-omega-3 fatty acids 1000 MG capsule Take 1 g by mouth 2 (two) times daily.      . folic acid (FOLVITE) 1 MG tablet Take 1 mg by mouth daily.     Marland Kitchen gabapentin (NEURONTIN) 300 MG capsule TAKE 2 CAPSULES BY MOUTH THREE TIMES A DAY 180 capsule 0  . IFEREX 150 150 MG capsule Take by mouth daily.  0  . loratadine (CLARITIN) 10 MG tablet Take 10 mg by mouth as needed.     . minocycline (MINOCIN,DYNACIN) 100 MG capsule Take 100 mg by mouth as needed. Reported on 09/07/2015    . Multiple Vitamin (VITAMIN E/FOLIC KKXF/G-1/W-29) CAPS Take by mouth every morning.    . rosuvastatin (CRESTOR) 20 MG tablet Take 20 mg by mouth daily.      . Vitamin E 400 UNITS TABS Take 1 tablet by mouth 2 (two) times daily.    . zafirlukast (ACCOLATE) 20 MG tablet Take 20 mg by mouth 2 (two) times daily.       No current facility-administered medications for this  visit.     Allergies as of 12/04/2017 - Review Complete 12/04/2017  Allergen Reaction Noted  . Beclomethasone Rash 09/02/2015  . Moxifloxacin      Vitals: BP (!) 144/75   Pulse 68   Ht 5' 9.5" (1.765 m)   Wt 299 lb (135.6 kg)   BMI 43.52 kg/m  Last Weight:  Wt Readings from Last 1 Encounters:  12/04/17 299 lb (135.6 kg)   HBZ:JIRC mass index is 43.52 kg/m.     Last Height:   Ht Readings from Last 1 Encounters:  12/04/17 5' 9.5" (1.765 m)    Physical exam:  General: The patient is awake, alert and appears not in acute distress. The patient is well groomed. Head: Normocephalic, atraumatic. Neck is supple. Mallampati  neck circumference:21. Nasal airflow patent , TMJ is not evident . Retrognathia is not seen.  Cardiovascular:  Regular rate and rhythm , without  murmurs or carotid bruit, and without distended neck veins. Respiratory: Lungs are clear to auscultation. Skin:  Without evidence of edema, or rash Trunk: BMI is morbildy elevated - 44. The patient's posture is erect.   Neurologic exam : The patient is awake and alert, oriented to place and time.   Memory  subjective  described as intact.  Attention span & concentration ability appears normal.  Speech is fluent, With  dysphonia.  Mood and affect are appropriate.  Cranial nerves: Pupils are equal and briskly reactive to light. Funduscopic exam without evidence of pallor or edema. Extraocular movements  in vertical and horizontal planes intact and without nystagmus. Visual fields by finger perimetry are intact. Hearing to finger rub intact.  Facial sensation intact to fine touch. Facial motor strength is symmetric and tongue and uvula move midline. Shoulder shrug was symmetrical.  Motor exam:  Normal tone, muscle bulk and symmetric strength in all extremities. Sensory:  Fine touch, pinprick and vibration were normal in upper extremities, Proprioception tested in the upper extremities was normal. Coordination:  Finger-to-nose maneuver  normal without evidence of ataxia, dysmetria or tremor. Gait and station: Patient walks without assistive device . Wide based gait while using AFOs. Turns with  5  Steps.  Deep tendon reflexes: in the  upper and lower extremities are symmetric and intact.    Assessment:  After physical and neurologic examination, review of laboratory studies,  Personal review of imaging studies, reports of other /same  Imaging studies, results of polysomnography and / or neurophysiology testing and pre-existing records as far as provided in visit., my assessment is   1) OSA-severe 18 years ago and since a very compliant user of CPAP-  We don't know what his apnea is now- but it is unlikely better. I need to see his current AHI to replace his CPAP machine and refit him for a mask. He is interested in a nasal pillow or mask on a trial basis.   2) neuropathy, non- diabetic, with high fall risk. AFO.   3) obesity, morbid-    The patient was advised of the nature of the diagnosed disorder , the treatment options and the  risks for general health and wellness arising from not treating the condition.   I spent more than 50 minutes of face to face time with the patient.  Greater than 50% of time was spent in counseling and coordination of care. We have discussed the diagnosis and differential and I answered the patient's questions.    Plan:  Treatment plan and additional workup :  SPLIT study- attended. AHI at 30, 4%. Tech to fit for a variety of masks.    Albert Seat, MD 0/26/3785, 88:50 AM  Certified in Neurology by ABPN Certified in [place] by [facility] [address], [place], [place] [address]

## 2018-01-04 ENCOUNTER — Ambulatory Visit (INDEPENDENT_AMBULATORY_CARE_PROVIDER_SITE_OTHER): Payer: Medicare Other | Admitting: Neurology

## 2018-01-04 DIAGNOSIS — J45909 Unspecified asthma, uncomplicated: Secondary | ICD-10-CM

## 2018-01-04 DIAGNOSIS — Z6841 Body Mass Index (BMI) 40.0 and over, adult: Secondary | ICD-10-CM

## 2018-01-04 DIAGNOSIS — G4733 Obstructive sleep apnea (adult) (pediatric): Secondary | ICD-10-CM | POA: Diagnosis not present

## 2018-01-04 DIAGNOSIS — Z9989 Dependence on other enabling machines and devices: Principal | ICD-10-CM

## 2018-01-15 NOTE — Addendum Note (Signed)
Addended by: Larey Seat on: 01/15/2018 05:38 PM   Modules accepted: Orders

## 2018-01-15 NOTE — Procedures (Signed)
PATIENT'S NAME:  Albert Barr, Albert Barr DOB:      03-05-1941      MR#:    [id number]     DATE OF RECORDING: 01/04/2018 REFERRING M.D.:  Reynold Bowen, MD Study Performed:  Split-Night Titration Study to establish CPAP aftercare HISTORY: MICHAELL GRIDER Sr. is a 38 y.old male patient, with a goal of transferring his CPAP care to [place].  Dr. Baldwin Crown referral letter stated that the patient had sleep apnea and was actually diagnosed by polysomnography on 20 March 2000 with an AHI of 106/h.  This would have been very severe sleep apnea, indeed.  His weight at the time was 295 pounds.  In October 2002 he was titrated to CPAP at 15 cmH2O prior pressure with a reduction of the AHI to 0.  At that time his weight was 275 pounds, 20 pounds less.     We were able to obtain a download for the patient CPAP compliance, which has been 100%, dated on 04 December 2014 for 30 days.  Average use at time of 8 hours 34 minutes- Set pressure is 15 cm water, but the machine is AutoSet capable.  No expiratory pressure relief is set.  The residual AHI is 0.2, which is an excellent resolution.  He does have very high air leaks however and these may be related to an ill fitting mask, or to the age of the current mask.  OSA on CPAP, Allergy, Arthritis, Morbid Obesity, Asthma, and Sleep Apnea.  The patient endorsed the Epworth Sleepiness Scale at 5/24 points   The patient's weight 299 pounds with a height of 70 (inches), resulting in a BMI of 43.4 kg/m2. The patient's neck circumference measured 21 inches.  CURRENT MEDICATIONS: Advair, Proventil, Androgel, Nexium, Fish Oil, Folvite, Neurontin, Iferex, Claritin, Minocycline, Multivitamin, Crestor, Vitamin E, Accolate.    PROCEDURE:  This is a multichannel digital polysomnogram utilizing the Somnostar 11.2 system.  Electrodes and sensors were applied and monitored per AASM Specifications.   EEG, EOG, Chin and Limb EMG, were sampled at 200 Hz.  ECG, Snore and Nasal Pressure, Thermal Airflow,  Respiratory Effort, CPAP Flow and Pressure, Oximetry was sampled at 50 Hz. Digital video and audio were recorded.      BASELINE STUDY WITHOUT CPAP RESULTS: Lights Out was at 22:38 and Lights On at 05:03.  Total recording time (TRT) was 167.5, with a total sleep time (TST) of 129.5 minutes.   The patient's sleep latency was 32.5 minutes.  REM latency was 0 minutes.  The sleep efficiency was 77.3 %.    SLEEP ARCHITECTURE: WASO (Wake after sleep onset) was 10 minutes, Stage N1 was 19 minutes, Stage N2 was 110.5 minutes, Stage N3 was 0 minutes and Stage R (REM sleep) was 0 minutes.  The percentages were Stage N1 14.7%, Stage N2 85.3%, Stage N3 0% and Stage R (REM sleep) 0%.  RESPIRATORY ANALYSIS:  There were a total of 107 respiratory events:  77 obstructive apneas, 0 central apneas and 30 hypopneas with 0 respiratory event related arousals (RERAs).  Snoring was noted.  The total APNEA/HYPOPNEA INDEX (AHI) was 49.6 /hour and the total RESPIRATORY DISTURBANCE INDEX was 49.6 /hour.  0 events occurred in REM sleep and 60 events in NREM. The REM AHI was 0, /hour versus a non-REM AHI of 49.6 /hour. The patient spent 290.5 minutes sleep time in the supine position 0 minutes in non-supine. The supine AHI was 49.6 /hour versus a non-supine AHI of 0.0 /hour.  OXYGEN SATURATION &  C02:  The wake baseline 02 saturation was 95%, with the lowest being 83%. Time spent below 89% saturation equaled 9 minutes.  PERIODIC LIMB MOVEMENTS: The patient had a total of 91 Periodic Limb Movements.  The Periodic Limb Movement (PLM) index was 42.2 /hour and the PLM Arousal index was 4.2 /hour. The arousals were noted as: 11 were spontaneous, 9 were associated with PLMs, and 12 were associated with respiratory events. The patient took one bathroom break. Snoring was noted. EKG was in keeping with normal sinus rhythm (NSR)  TITRATION STUDY WITH CPAP RESULTS: CPAP was initiated at 5 cmH20 with heated humidity per AASM split night  standards and pressure was advanced to 11 cmH20 because of hypopneas, apneas and desaturations.  At a PAP pressure of 11 cmH20, there was a reduction of the AHI to 0.0 /hour.  Total recording time (TRT) was 218 minutes, with a total sleep time (TST) of 161 minutes. The patient's sleep latency was 57 minutes. REM latency was 102 minutes.  The sleep efficiency was 73.9 %.    SLEEP ARCHITECTURE: Wake after sleep was 37.5 minutes, Stage N1 13.5 minutes, Stage N2 105.5 minutes, Stage N3 0 minutes and Stage R (REM sleep) 42 minutes. The percentages were: Stage N1 8.4%, Stage N2 65.5%, Stage N3 0% and Stage R (REM sleep) 26.1%.   RESPIRATORY ANALYSIS:  There were a total of 1 respiratory event: 0 apneas and 1 hypopnea with a hypopnea index of 0.4 /hour. The patient also had 0 respiratory event related arousals (RERAs).     The total APNEA/HYPOPNEA INDEX (AHI) was 0.4 /hour and the total RESPIRATORY DISTURBANCE INDEX was 0.4 /hour.  0 events occurred in REM sleep and 1 events in NREM. The REM AHI was 0.0 /hour versus a non-REM AHI of 0.5 /hour. REM sleep was achieved on a pressure of 10 cm/H20. The patient spent 100% of total sleep time in the supine position.  OXYGEN SATURATION & C02:  The wake baseline 02 saturation was 93%, with the lowest being 86%. Time spent below 89% saturation equaled 10 minutes.  PERIODIC LIMB MOVEMENTS:   The patient had a total of 0 Periodic Limb Movements. The arousals were noted as: 13 were spontaneous, 0 were associated with PLMs, and 0 were associated with respiratory events. Post-study, the patient indicated that sleep was the same as usual once CPAP was in place.   POLYSOMNOGRAPHY IMPRESSION :   1. Severe Obstructive Sleep Apnea(OSA) was confirmed and CPAP re-titrated to 11 cm water pressure. REM sleep rebounded and apnea was eliminated.    RECOMMENDATIONS: The patient was fitted with a medium sized ResMed  "AirFit P 30 i"  apparatus. His auto-titration capable machine can  be reset for 8 through 12 cm water pressure. 1 cm EPR.     A follow up appointment will be scheduled in the Sleep Clinic at [facility].      I certify that I have reviewed the entire raw data recording prior to the issuance of this report in accordance with the Standards of Accreditation of the American Academy of Sleep Medicine (AASM)    Larey Seat, M.D.    01-15-2018  Diplomat, American Board of Psychiatry and Neurology  Diplomat, [place] of Sleep Medicine Medical Director, [place] [facility] at [facility]

## 2018-01-16 ENCOUNTER — Telehealth: Payer: Self-pay | Admitting: Neurology

## 2018-01-16 NOTE — Telephone Encounter (Signed)
Called the patient and left a message with the wife for the pt to call back and get his results. Wife gave me direct line to his work. I attempted to call but didn't get through to him.

## 2018-01-16 NOTE — Telephone Encounter (Signed)
-----   Message from Larey Seat, MD sent at 01/15/2018  5:38 PM EDT ----- POLYSOMNOGRAPHY IMPRESSION :   1. Severe Obstructive Sleep Apnea(OSA) was confirmed and CPAP  re-titrated to 11 cm water pressure. REM sleep rebounded and  apnea was eliminated.   RECOMMENDATIONS: The patient was fitted with a medium sized  ResMed "AirFit P 30 i" apparatus.  His to be issued new  auto-titration capable  machine will be  set for 8 through 12 cm water pressure. 1 cm  EPR.

## 2018-01-17 NOTE — Telephone Encounter (Signed)
Made 2nd attempt to call the pt. No answer. LVM for the pt to call back.

## 2018-01-18 ENCOUNTER — Encounter: Payer: Self-pay | Admitting: Neurology

## 2018-01-18 NOTE — Telephone Encounter (Signed)
I called Albert Barr. I advised Albert Barr that Dr. Brett Fairy reviewed their sleep study results and found that Albert Barr has sleep apnea. Dr. Brett Fairy recommends that Albert Barr starts auto CPAP. I reviewed PAP compliance expectations with the Albert Barr. Albert Barr is agreeable to starting a CPAP. I advised Albert Barr that an order will be sent to a DME,  [facility], and [employer] will call the Albert Barr within about one week after they file with the Albert Barr's insurance. [employer] will show the Albert Barr how to use the machine, fit for masks, and troubleshoot the CPAP if needed. A follow up appt was made for insurance purposes with Dr. Brett Fairy on 03/22/18 at 8:30 am. Albert Barr verbalized understanding to arrive 15 minutes early and bring their CPAP. A letter with all of this information in it will be mailed to the Albert Barr as a reminder. I verified with the Albert Barr that the address we have on file is correct. Albert Barr verbalized understanding of results. Albert Barr had no questions at this time but was encouraged to call back if questions arise.

## 2018-01-25 ENCOUNTER — Ambulatory Visit (INDEPENDENT_AMBULATORY_CARE_PROVIDER_SITE_OTHER): Payer: Medicare Other | Admitting: Sports Medicine

## 2018-01-25 ENCOUNTER — Encounter: Payer: Self-pay | Admitting: Sports Medicine

## 2018-01-25 DIAGNOSIS — M21371 Foot drop, right foot: Secondary | ICD-10-CM

## 2018-01-25 DIAGNOSIS — M21372 Foot drop, left foot: Secondary | ICD-10-CM | POA: Diagnosis not present

## 2018-01-25 DIAGNOSIS — G629 Polyneuropathy, unspecified: Secondary | ICD-10-CM

## 2018-01-25 NOTE — Assessment & Plan Note (Signed)
With marked weakness and periodic pain would continue gabapentin  Plan to titrate upward as tolerated

## 2018-01-25 NOTE — Progress Notes (Signed)
Albert Presume Sr. - 77 y.o. male MRN [id number]  Date of birth: 1940/09/10   Chief complaint: Bilateral foot drop w/ neuropathy  SUBJECTIVE:    History of present illness: Albert Barr is a 77 year old Caucasian male who presents today with a chief complaint of bilateral foot drop with peripheral neuropathy.  This is been a chronic issue for him.  It is stable and not worsening. To manage his symptoms of nighttime pain, numbness, tingling, and difficulty with ambulation, he has been wearing ankle-foot orthoses: Walk-On Reaction AFO size 11.  He has had these for approximately 2 years however there are now concerns that they are breaking down especially on the lateral skinny component.  He is in need of new ankle-foot orthoses due to this cracking and malfunction of his current hardware.  He denies any significant ulcerations or pressure wounds from his current orthoses.  He states they are functioning properly.  He wears them daily and they assist him in ambulation and allow him to work.  He also augments his treatment with 600 mg of gabapentin at night.  He was originally prescribed this up to 3 times daily however he was experiencing daytime sleepiness.  Denies any sleep disturbance or other side effects of the medication.  He is going to retire in the next 3 months and would like to titrate up his gabapentin at that time.  Denies any fevers, chills or night sweats.  He does have bilateral lower extremity weakness right worse than left.  He also gets numbness of his right foot and ankle especially at night that is improved with elevation and adduction.  No loss of bowel or bladder function.   Review of systems:  As stated above   Interval past medical history, surgical history, family history, and social history obtained and are unchanged.   OBJECTIVE:  Physical exam: Vital signs are reviewed. BP 126/65   Ht 5\' 10"  (1.778 m)   Wt 290 lb (131.5 kg)   BMI 41.61 kg/m   Gen.: Alert, oriented,  appears stated age, morbidly obese, in no apparent distress Integumentary: No rashes, skin break down, or ulcerations Neurologic: b/l foot drop R worse than L Gait: Hips externally rotate R > L to compensate for foot drop with significant hip flexion, slow, high stepping pattern without assistance (AFO's absent when examining gait) Psych: Normal affect, pleasant Musculoskeletal: Inspection of the bilateral extremities demonstrate no significant abnormalities however he does have baseline external rotation of both hips.  No significant tenderness to palpation over his foot or ankle bilaterally.  He does have decreased sensation over the lateral and plantar aspects of both feet.  Patient has decreased range of motion in ankle dorsiflexion to approximately 5 degrees on the right and 0 degrees on the left.  Plantar flexion is intact.  Strength is 3 out of 5 in dorsiflexion bilaterally, 5 out of 5 in plantarflexion bilaterally, 3 out of 5 in EHL, 5 out of 5 and FHL, negative anterior drawer, negative inversion eversion testing, dorsalis pedis pulse 2 out of 4    ASSESSMENT & PLAN: 1.  Bilateral foot drop 2.  Peripheral neuropathy 3.  BMI of 41   Plan: After examining the patient's gait without the ankle-foot orthoses and examining the condition of his current orthoses, I do recommend he is in need of a higher strength/increased durability AFO.  His current ones are cracked and malfunctioning.  I am recommending a blue rocker orthoses or one that can compensate for his size.  He does benefit from these AFO braces in terms of gait stabilization.  We will set him up with an orthotist to assist with this process.  We do recommend continuing to stay as active as possible to help with weight loss.  He will continue 600 mg of gabapentin nightly until he retires.  At that time, I do see that it is reasonable to titrate up to 600 mg 3 times daily over the course of the week.  He is to contact the sports medicine  clinic once he retires and we will discuss how to titrate up on the dosage of this medication to help avoid excessive daytime sleepiness with work.  Patient is in understanding of this plan.  Clydene Laming, DO Sports Medicine Fellow [facility]  I observed and examined the patient with the [facility] and agree with assessment and plan.  Note reviewed and modified by me. Stefanie Libel, MD

## 2018-01-25 NOTE — Assessment & Plan Note (Signed)
Needs an AFO designed to support greater weight  These definietly help his ambulation

## 2018-02-12 ENCOUNTER — Encounter: Payer: Self-pay | Admitting: Internal Medicine

## 2018-03-01 ENCOUNTER — Encounter: Payer: Self-pay | Admitting: Internal Medicine

## 2018-03-20 ENCOUNTER — Telehealth: Payer: Self-pay | Admitting: Neurology

## 2018-03-20 ENCOUNTER — Encounter: Payer: Self-pay | Admitting: Adult Health

## 2018-03-20 NOTE — Telephone Encounter (Signed)
Pt returning RNs call, Took appt with NP Megan for 9/26 at 2:30 [facility]

## 2018-03-20 NOTE — Telephone Encounter (Signed)
Called the patient to offer a later apt in the day on Thursday. The pt is scheduled 8:30 am 8/26. When we scheduled this apt pt wanted to advise if later apt became available. Called to inform that Ward Givens NP had openings on thur 8/26 same day but as of now available 9 am and 2:30 pm. LVM informing the patient to call and let us know if he wished to keep the 8:30 am apt as already planned or if he wanted to push his apt to later time the NP has opened.   Please discuss pt preference when he calls back

## 2018-03-22 ENCOUNTER — Ambulatory Visit: Payer: Self-pay | Admitting: Neurology

## 2018-03-22 ENCOUNTER — Ambulatory Visit (INDEPENDENT_AMBULATORY_CARE_PROVIDER_SITE_OTHER): Payer: Medicare Other | Admitting: Adult Health

## 2018-03-22 ENCOUNTER — Encounter: Payer: Self-pay | Admitting: Adult Health

## 2018-03-22 VITALS — BP 136/82 | HR 68 | Ht 69.5 in | Wt 297.0 lb

## 2018-03-22 DIAGNOSIS — Z9989 Dependence on other enabling machines and devices: Secondary | ICD-10-CM | POA: Diagnosis not present

## 2018-03-22 DIAGNOSIS — G4733 Obstructive sleep apnea (adult) (pediatric): Secondary | ICD-10-CM | POA: Diagnosis not present

## 2018-03-22 NOTE — Progress Notes (Signed)
PATIENT: Albert CU Barr. DOB: 11-19-1940  REASON FOR VISIT: follow up HISTORY FROM: patient  HISTORY OF PRESENT ILLNESS: Today 03/22/18: [name] is a 77 year old male with a history of obstructive sleep apnea on CPAP.  His CPAP download indicates that he uses machine 25 out of 30 days for compliance of 83%.  He uses machine greater than 4 hours each night.  On average he uses machine 8 hours and 22 minutes.  His residual AHI is 0.8 on 8 to 12 cm of water with EPR of 1.  He reports that he occasionally will feel the mask leaking but typically it does not wake him up.  He states that the full facemask works the best for him.  His Epworth sleepiness score is 5.   HISTORY (copied from Dr. Edwena Felty note) Albert Barr. is a 77 y.o. male patient , seen here as in a referral and transfer of CPAP care, via Dr. Forde Dandy.  I have the pleasure of meeting with Mr. Christoffersen today, on 04 December 2017, with a goal of transferring his CPAP care to [place].  Dr. Ancil Boozer referral letter stated that the patient had sleep apnea and was actually diagnosed by polysomnography on 20 March 2000 with an AHI of 106/h.  This would have been very severe sleep apnea indeed.  His weight at the time was 295 pounds.  In October 2002 he was titrated to CPAP at 15 cmH2O prior pressure with a reduction of the AHI to 0.  At that time his weight was 275 pounds, 20 pounds less.    We were able to obtain a download for the patient CPAP compliance which has been 100% dated on 04 December 2014 for 30 days.  Average use at time of 8 hours 34 minutes, I can clearly see that on some days he does sleep a little longer.  Set pressure is 15 cm water, but the machine is AutoSet capable.  No expiratory pressure relief is set.  The residual AHI is 0.2 which is an excellent resolution.  He does have very high air leaks however and these may be related to a ill fitting mask, or to the age of the current mask.  Sleep habits are as follows: The  patient's bedtime is between 11 PM and 1130, and he usually works on his computer and said his prayer before.  His bedroom is described as cool, quiet and dark.  He uses a ceiling fan. Hard time to go to sleep - 30 -60 minutes of sleep latency. He sleeps on his side usually, mostly on the left, on one pillow. Wears CPAP nightly, wears eye protectant.  Has 0-1 nocturias. Snores loudly without CPAP. Wife snores. Sleeps through until the morning- rises at 7.45. Feels OK- not well refreshed.  No headaches, on week-ends he sleeps in - 9-10 hours and feels best.   REVIEW OF SYSTEMS: Out of a complete 14 system review of symptoms, the patient complains only of the following symptoms, and all other reviewed systems are negative.  Epworth sleepiness score 5  ALLERGIES: Allergies  Allergen Reactions  . Beclomethasone Rash  . Moxifloxacin     REACTION: Diarrhea    HOME MEDICATIONS: Outpatient Medications Prior to Visit  Medication Sig Dispense Refill  . ADVAIR DISKUS 250-50 MCG/DOSE AEPB Inhale 1 puff into the lungs 2 (two) times daily.     Marland Kitchen albuterol (PROVENTIL HFA;VENTOLIN HFA) 108 (90 BASE) MCG/ACT inhaler Inhale 2 puffs into the lungs every  6 (six) hours as needed.      . ANDROGEL PUMP 20.25 MG/ACT (1.62%) GEL 2 Squirts daily.     Marland Kitchen aspirin 81 MG tablet Take 81 mg by mouth daily.      Marland Kitchen esomeprazole (NEXIUM) 40 MG capsule Take 40 mg by mouth daily before breakfast.      . fish oil-omega-3 fatty acids 1000 MG capsule Take 1 g by mouth 2 (two) times daily.      . folic acid (FOLVITE) 1 MG tablet Take 1 mg by mouth daily.     Marland Kitchen gabapentin (NEURONTIN) 300 MG capsule TAKE 2 CAPSULES BY MOUTH THREE TIMES A DAY 180 capsule 0  . IFEREX 150 150 MG capsule Take by mouth daily.  0  . loratadine (CLARITIN) 10 MG tablet Take 10 mg by mouth as needed.     . minocycline (MINOCIN,DYNACIN) 100 MG capsule Take 100 mg by mouth as needed. Reported on 09/07/2015    . Multiple Vitamin (VITAMIN E/FOLIC  HALP/F-7/T-02) CAPS Take by mouth every morning.    . rosuvastatin (CRESTOR) 20 MG tablet Take 20 mg by mouth daily.      . Vitamin E 400 UNITS TABS Take 1 tablet by mouth 2 (two) times daily.    . zafirlukast (ACCOLATE) 20 MG tablet Take 20 mg by mouth 2 (two) times daily.       No facility-administered medications prior to visit.     PAST MEDICAL HISTORY: Past Medical History:  Diagnosis Date  . Allergy   . Anal fissure   . Arthritis    shoulder, knees  . Atrophic gastritis without mention of hemorrhage   . Esophageal reflux   . Family history of gastric cancer   . Fibula fracture    hair line fracture  . Gastric polyps    history  . Hiatal hernia   . Hx of adenomatous colonic polyps   . Hyperlipidemia   . Metaplasia of esophagus 2011   "gastric metaplasia"  . Morbid obesity ([place])   . Obstructive sleep apnea on CPAP   . Other voice and resonance disorders   . Peripheral neuropathy   . Polyposis syndrome gastric and colonic - attenuated 12/09/2015  . Unspecified asthma(493.90)     PAST SURGICAL HISTORY: Past Surgical History:  Procedure Laterality Date  . APPENDECTOMY    . CHOLECYSTECTOMY    . COLONOSCOPY  08/20/2009 (multiple)   Tubular adenoma  . ESOPHAGOGASTRODUODENOSCOPY  08/20/09 (multiple)  . INCISION AND DRAINAGE PERIRECTAL ABSCESS  2007  . KNEE SURGERY     arthroscopy- both knees  . POLYPECTOMY    . UPPER GASTROINTESTINAL ENDOSCOPY      FAMILY HISTORY: Family History  Problem Relation Age of Onset  . Stomach cancer Mother 11  . Cancer Sister        bladder  . Colon cancer Neg Hx   . Esophageal cancer Neg Hx   . Rectal cancer Neg Hx     SOCIAL HISTORY: Social History   Socioeconomic History  . Marital status: Married    Spouse name: Not on file  . Number of children: 3  . Years of education: Not on file  . Highest education level: Not on file  Occupational History  . Occupation: Administrator: [employer]  . Financial resource strain: Not on file  . Food insecurity:    Worry: Not on file    Inability: Not on file  .  Transportation needs:    Medical: Not on file    Non-medical: Not on file  Tobacco Use  . Smoking status: Former Smoker    Packs/day: 2.00    Years: 10.00    Pack years: 20.00    Types: Cigarettes    Last attempt to quit: 02/16/1974    Years since quitting: 44.1  . Smokeless tobacco: Never Used  Substance and Sexual Activity  . Alcohol use: Yes    Comment: moderate  . Drug use: No  . Sexual activity: Not on file  Lifestyle  . Physical activity:    Days per week: Not on file    Minutes per session: Not on file  . Stress: Not on file  Relationships  . Social connections:    Talks on phone: Not on file    Gets together: Not on file    Attends religious service: Not on file    Active member of club or organization: Not on file    Attends meetings of clubs or organizations: Not on file    Relationship status: Not on file  . Intimate partner violence:    Fear of current or ex partner: Not on file    Emotionally abused: Not on file    Physically abused: Not on file    Forced sexual activity: Not on file  Other Topics Concern  . Not on file  Social History Narrative  . Not on file      PHYSICAL EXAM  Vitals:   03/22/18 1416  BP: 136/82  Pulse: 68  Weight: 297 lb (134.7 kg)  Height: 5' 9.5" (1.765 m)   Body mass index is 43.23 kg/m.  Generalized: Well developed, in no acute distress   Neurological examination  Mentation: Alert oriented to time, place, history taking. Follows all commands speech and language fluent Cranial nerve II-XII:  Extraocular movements were full, visual field were full on confrontational test. Facial sensation and strength were normal. Uvula tongue midline. Head turning and shoulder shrug  were normal and symmetric. Motor: The motor testing reveals 5 over 5 strength of all 4 extremities. Good symmetric motor tone  is noted throughout.  Sensory: Sensory testing is intact to soft touch on all 4 extremities. No evidence of extinction is noted.  Coordination: Cerebellar testing reveals good finger-nose-finger and heel-to-shin bilaterally.  Gait and station: Gait is normal.    DIAGNOSTIC DATA (LABS, IMAGING, TESTING) - I reviewed patient records, labs, notes, testing and imaging myself where available.  No results found for: WBC, HGB, HCT, MCV, PLT    Component Value Date/Time   NA 145 04/23/2008 1012   K 4.1 04/23/2008 1012   CL 108 04/23/2008 1012   CO2 31 04/23/2008 1012   GLUCOSE 96 04/23/2008 1012   BUN 13 04/23/2008 1012   CREATININE 0.8 04/23/2008 1012   CALCIUM 9.2 04/23/2008 1012   PROT 6.9 04/23/2008 1012   ALBUMIN 4.0 04/23/2008 1012   AST 23 04/23/2008 1012   ALT 25 04/23/2008 1012   ALKPHOS 38 (L) 04/23/2008 1012   BILITOT 0.6 04/23/2008 1012   GFRNONAA 102 04/23/2008 1012   GFRAA 124 04/23/2008 1012   Lab Results  Component Value Date   CHOL 156 04/23/2008   HDL 42.3 04/23/2008   LDLCALC 89 04/23/2008   TRIG 122 04/23/2008   CHOLHDL 3.7 CALC 04/23/2008   Lab Results  Component Value Date   HGBA1C 5.6 05/18/2007      ASSESSMENT AND PLAN 77 y.o. year old male  has a past medical history of Allergy, Anal fissure, Arthritis, Atrophic gastritis without mention of hemorrhage, Esophageal reflux, Family history of gastric cancer, Fibula fracture, Gastric polyps, Hiatal hernia, adenomatous colonic polyps, Hyperlipidemia, Metaplasia of esophagus (2011), Morbid obesity ([place]), Obstructive sleep apnea on CPAP, Other voice and resonance disorders, Peripheral neuropathy, Polyposis syndrome gastric and colonic - attenuated (12/09/2015), and Unspecified asthma(493.90). here with :  1.  Obstructive sleep apnea on CPAP  The patient CPAP download shows excellent compliance and good treatment of his apnea.  He was advised to continue using CPAP nightly and greater than 4 hours each night.   If his symptoms worsen or he develop new symptoms he should let us know.  We will follow-up in 1 year or sooner if needed.  I spent 15 minutes with the patient. 50% of this time was spent reviewing CPAP download   Ward Givens, MSN, NP-C 03/22/2018, 2:32 PM [facility] [address], [place], [place] [address] [phone]

## 2018-03-22 NOTE — Patient Instructions (Signed)
Your Plan:  Continue using CPAP nightly and >4 hours each night If your symptoms worsen or you develop new symptoms please let us know.   Thank you for coming to see us at [facility]. I hope we have been able to provide you high quality care today.  You may receive a patient satisfaction survey over the next few weeks. We would appreciate your feedback and comments so that we may continue to improve ourselves and the health of our patients.  

## 2018-04-24 ENCOUNTER — Other Ambulatory Visit: Payer: Self-pay | Admitting: *Deleted

## 2018-04-24 MED ORDER — AMBULATORY NON FORMULARY MEDICATION: 1 refills | Status: AC

## 2018-04-24 MED ORDER — AMBULATORY NON FORMULARY MEDICATION: 1 refills | Status: DC

## 2018-04-24 NOTE — Telephone Encounter (Signed)
Will fax AFO prescription to [facility] at [phone]

## 2018-04-25 ENCOUNTER — Encounter: Payer: Self-pay | Admitting: Internal Medicine

## 2018-04-25 ENCOUNTER — Ambulatory Visit (INDEPENDENT_AMBULATORY_CARE_PROVIDER_SITE_OTHER): Payer: Medicare Other | Admitting: Internal Medicine

## 2018-04-25 DIAGNOSIS — Z86010 Personal history of colonic polyps: Secondary | ICD-10-CM | POA: Diagnosis not present

## 2018-04-25 DIAGNOSIS — K317 Polyp of stomach and duodenum: Secondary | ICD-10-CM | POA: Diagnosis not present

## 2018-04-25 DIAGNOSIS — D126 Benign neoplasm of colon, unspecified: Secondary | ICD-10-CM | POA: Diagnosis not present

## 2018-04-25 NOTE — Assessment & Plan Note (Signed)
EGD

## 2018-04-25 NOTE — Progress Notes (Signed)
Albert Barr 77 y.o. 12-Dec-1940 [id number]  Assessment & Plan:   Encounter Diagnoses  Name Primary?  . Polyposis syndrome gastric and colonic - attenuated   . Multiple gastric polyps   . History of colonic polyps     Polyposis syndrome gastric and colonic - attenuated EGD/Colonoscopy The risks and benefits as well as alternatives of endoscopic procedure(s) have been discussed and reviewed. All questions answered. The patient agrees to proceed.   Multiple gastric polyps EGD  History of colonic polyps colonoscopy  I appreciate the opportunity to care for this patient. CC: Reynold Bowen, MD    Subjective:   Chief Complaint: History of polyps time for procedures  HPI The patient is here to arrange follow-up procedures.  He has an occasional twinge of left upper quadrant pain and his stools are somewhat softer at times.  He has a history of multiple polyps of the colon and the stomach.  His mother died of gastric cancer.  He has had genetic evaluation but no identifiable genetic abnormalities that could be linked to this.  I have him on an every 2-year surveillance program.  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  He is looking forward to retirement.  He did very much enjoy his career. Allergies  Allergen Reactions  . Beclomethasone Rash  . Moxifloxacin     REACTION: Diarrhea   Current Meds  Medication Sig  . ADVAIR DISKUS 250-50 MCG/DOSE AEPB Inhale 1 puff into the lungs 2 (two) times daily.   Marland Kitchen albuterol (PROVENTIL HFA;VENTOLIN HFA) 108 (90 BASE) MCG/ACT inhaler Inhale 2 puffs into the lungs every 6 (six) hours as needed.    . AMBULATORY NON FORMULARY MEDICATION Wylene Men 2 1/2   Foot drop, bilateral  Codes: M21.371, M21.372  . ANDROGEL PUMP 20.25 MG/ACT (1.62%) GEL 2 Squirts daily.   Marland Kitchen aspirin 81 MG tablet Take 81 mg by mouth daily.    Marland Kitchen esomeprazole (NEXIUM) 40 MG capsule  Take 40 mg by mouth daily before breakfast.    . fish oil-omega-3 fatty acids 1000 MG capsule Take 1 g by mouth 2 (two) times daily.    . folic acid (FOLVITE) 1 MG tablet Take 1 mg by mouth daily.   Marland Kitchen gabapentin (NEURONTIN) 300 MG capsule TAKE 2 CAPSULES BY MOUTH THREE TIMES A DAY  . IFEREX 150 150 MG capsule Take by mouth daily.  Marland Kitchen loratadine (CLARITIN) 10 MG tablet Take 10 mg by mouth as needed.   . minocycline (MINOCIN,DYNACIN) 100 MG capsule Take 100 mg by mouth as needed. Reported on 09/07/2015  . Multiple Vitamin (VITAMIN E/FOLIC OIZT/I-4/P-80) CAPS Take by mouth every morning.  . rosuvastatin (CRESTOR) 20 MG tablet Take 20 mg by mouth daily.    . Vitamin E 400 UNITS TABS Take 1 tablet by mouth 2 (two) times daily.  . zafirlukast (ACCOLATE) 20 MG tablet Take 20 mg by mouth 2 (two) times daily.     Past Medical History:  Diagnosis Date  . Allergy   . Anal fissure   . Arthritis    shoulder, knees  . Atrophic gastritis without mention of hemorrhage   . Esophageal reflux   . Family history of gastric cancer   . Fibula fracture    hair line fracture  . Gastric polyps    history  . Hiatal hernia   . Hx of adenomatous colonic polyps   . Hyperlipidemia   . Metaplasia  of esophagus 2011   "gastric metaplasia"  . Morbid obesity ([place])   . Obstructive sleep apnea on CPAP   . Other voice and resonance disorders   . Peripheral neuropathy   . Polyposis syndrome gastric and colonic - attenuated 12/09/2015  . Unspecified asthma(493.90)    Past Surgical History:  Procedure Laterality Date  . APPENDECTOMY    . CHOLECYSTECTOMY    . COLONOSCOPY  08/20/2009 (multiple)   Tubular adenoma  . ESOPHAGOGASTRODUODENOSCOPY  08/20/09 (multiple)  . INCISION AND DRAINAGE PERIRECTAL ABSCESS  2007  . KNEE SURGERY     arthroscopy- both knees  . POLYPECTOMY    . 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   Review of Systems See HPI  Objective:   Physical Exam BP (!) 152/82 (BP Location: Left Arm, Patient Position: Sitting, Cuff Size: Normal)   Pulse 68   Ht 5\' 9"  (1.753 m)   Wt 292 lb 6 oz (132.6 kg)   BMI 43.18 kg/m  Obese no acute distress Eyes anicteric Lungs are clear Heart sounds are normal

## 2018-04-25 NOTE — Assessment & Plan Note (Signed)
EGD/Colonoscopy The risks and benefits as well as alternatives of endoscopic procedure(s) have been discussed and reviewed. All questions answered. The patient agrees to proceed.

## 2018-04-25 NOTE — Patient Instructions (Signed)
You have been scheduled for an endoscopy and colonoscopy. Please follow the written instructions given to you at your visit today. Please pick up your prep supplies at the pharmacy. If you use inhalers (even only as needed), please bring them with you on the day of your procedure.   I appreciate the opportunity to care for you. Carl Gessner, MD, FACG 

## 2018-04-25 NOTE — Assessment & Plan Note (Signed)
colonoscopy

## 2018-04-27 DIAGNOSIS — C4492 Squamous cell carcinoma of skin, unspecified: Secondary | ICD-10-CM

## 2018-04-27 HISTORY — DX: Squamous cell carcinoma of skin, unspecified: C44.92

## 2018-05-28 ENCOUNTER — Ambulatory Visit (AMBULATORY_SURGERY_CENTER): Payer: Medicare Other | Admitting: Internal Medicine

## 2018-05-28 ENCOUNTER — Encounter: Payer: Self-pay | Admitting: Internal Medicine

## 2018-05-28 VITALS — BP 136/82 | HR 53 | Temp 97.3°F | Resp 10 | Ht 69.0 in | Wt 292.0 lb

## 2018-05-28 DIAGNOSIS — K317 Polyp of stomach and duodenum: Secondary | ICD-10-CM

## 2018-05-28 DIAGNOSIS — D124 Benign neoplasm of descending colon: Secondary | ICD-10-CM | POA: Diagnosis not present

## 2018-05-28 DIAGNOSIS — D126 Benign neoplasm of colon, unspecified: Secondary | ICD-10-CM

## 2018-05-28 DIAGNOSIS — D122 Benign neoplasm of ascending colon: Secondary | ICD-10-CM

## 2018-05-28 DIAGNOSIS — D123 Benign neoplasm of transverse colon: Secondary | ICD-10-CM | POA: Diagnosis not present

## 2018-05-28 MED ORDER — SODIUM CHLORIDE 0.9 % IV SOLN: 500.0000 mL | Freq: Once | INTRAVENOUS | Status: DC

## 2018-05-28 NOTE — Progress Notes (Signed)
To recovery, report to RN, VSS. 

## 2018-05-28 NOTE — Op Note (Signed)
[facility] Patient Name: Albert Barr Procedure Date: 05/28/2018 2:41 PM MRN: [id number] Endoscopist: Gatha Mayer , MD Age: 77 Referring MD:  Date of Birth: 04/19/1941 Gender: Male Account #: [id number] Procedure:                Colonoscopy Indications:              Attenuated polyposis syndrome, hx adenomas Medicines:                Propofol per Anesthesia, Monitored Anesthesia Care Procedure:                Pre-Anesthesia Assessment:                           - Prior to the procedure, a History and Physical                            was performed, and patient medications and                            allergies were reviewed. The patient's tolerance of                            previous anesthesia was also reviewed. The risks                            and benefits of the procedure and the sedation                            options and risks were discussed with the patient.                            All questions were answered, and informed consent                            was obtained. Prior Anticoagulants: The patient has                            taken no previous anticoagulant or antiplatelet                            agents. ASA Grade Assessment: III - A patient with                            severe systemic disease. After reviewing the risks                            and benefits, the patient was deemed in                            satisfactory condition to undergo the procedure.                           - Prior to the procedure, a History and Physical  was performed, and patient medications and                            allergies were reviewed. The patient's tolerance of                            previous anesthesia was also reviewed. The risks                            and benefits of the procedure and the sedation                            options and risks were discussed with the patient.   All questions were answered, and informed consent                            was obtained. Prior Anticoagulants: The patient has                            taken no previous anticoagulant or antiplatelet                            agents. ASA Grade Assessment: III - A patient with                            severe systemic disease. After reviewing the risks                            and benefits, the patient was deemed in                            satisfactory condition to undergo the procedure.                           After obtaining informed consent, the colonoscope                            was passed under direct vision. Throughout the                            procedure, the patient's blood pressure, pulse, and                            oxygen saturations were monitored continuously. The                            Colonoscope was introduced through the anus and                            advanced to the the cecum, identified by                            appendiceal orifice and ileocecal valve. The  colonoscopy was performed without difficulty. The                            patient tolerated the procedure well. The quality                            of the bowel preparation was good. The bowel                            preparation used was Miralax. The ileocecal valve,                            appendiceal orifice, and rectum were photographed. Scope In: 2:42:15 PM Scope Out: 3:07:05 PM Scope Withdrawal Time: 0 hours 21 minutes 37 seconds  Total Procedure Duration: 0 hours 24 minutes 50 seconds  Findings:                 The perianal and digital rectal examinations were                            normal. Pertinent negatives include normal prostate                            (size, shape, and consistency).                           Five sessile polyps were found in the descending                            colon, transverse colon and ascending colon.  The                            polyps were diminutive in size. These polyps were                            removed with a cold snare. Resection and retrieval                            were complete. Verification of patient                            identification for the specimen was done. Estimated                            blood loss was minimal.                           The exam was otherwise without abnormality on                            direct and retroflexion views. Complications:            No immediate complications. Estimated Blood Loss:     Estimated blood loss was minimal. Impression:               -  Five diminutive polyps in the descending colon,                            in the transverse colon and in the ascending colon,                            removed with a cold snare. Resected and retrieved.                           - The examination was otherwise normal on direct                            and retroflexion views.                           - Personal history of colonic polyps. Attenuated                            polyposis Recommendation:           - Patient has a contact number available for                            emergencies. The signs and symptoms of potential                            delayed complications were discussed with the                            patient. Return to normal activities tomorrow.                            Written discharge instructions were provided to the                            patient.                           - Resume previous diet.                           - Continue present medications.                           - Repeat colonoscopy is recommended for                            surveillance. The colonoscopy date will be                            determined after pathology results from today's                            exam become available for review. Gatha Mayer, MD 05/28/2018 3:17:37 PM This report has  been signed electronically.

## 2018-05-28 NOTE — Patient Instructions (Addendum)
There were some small stomach polyps seen again - look benign. Biopsies taken.  I found and removed 5 tiny colon polyps.  I will let you know pathology results and when to have another routine colonoscopy by mail and/or My Chart.  I appreciate the opportunity to care for you. Gatha Mayer, MD, [facility]  Handouts:  Polyps  YOU HAD AN ENDOSCOPIC PROCEDURE TODAY AT [facility]:   Refer to the procedure report that was given to you for any specific questions about what was found during the examination.  If the procedure report does not answer your questions, please call your gastroenterologist to clarify.  If you requested that your care partner not be given the details of your procedure findings, then the procedure report has been included in a sealed envelope for you to review at your convenience later.  YOU SHOULD EXPECT: Some feelings of bloating in the abdomen. Passage of more gas than usual.  Walking can help get rid of the air that was put into your GI tract during the procedure and reduce the bloating. If you had a lower endoscopy (such as a colonoscopy or flexible sigmoidoscopy) you may notice spotting of blood in your stool or on the toilet paper. If you underwent a bowel prep for your procedure, you may not have a normal bowel movement for a few days.  Please Note:  You might notice some irritation and congestion in your nose or some drainage.  This is from the oxygen used during your procedure.  There is no need for concern and it should clear up in a day or so.  SYMPTOMS TO REPORT IMMEDIATELY:   Following lower endoscopy (colonoscopy or flexible sigmoidoscopy):  Excessive amounts of blood in the stool  Significant tenderness or worsening of abdominal pains  Swelling of the abdomen that is new, acute  Fever of 100F or higher   Following upper endoscopy (EGD)  Vomiting of blood or coffee ground material  New chest pain or pain under the shoulder  blades  Painful or persistently difficult swallowing  New shortness of breath  Fever of 100F or higher  Black, tarry-looking stools  For urgent or emergent issues, a gastroenterologist can be reached at any hour by calling [phone].   DIET:  We do recommend a small meal at first, but then you may proceed to your regular diet.  Drink plenty of fluids but you should avoid alcoholic beverages for 24 hours.  ACTIVITY:  You should plan to take it easy for the rest of today and you should NOT DRIVE or use heavy machinery until tomorrow (because of the sedation medicines used during the test).    FOLLOW UP: Our staff will call the number listed on your records the next business day following your procedure to check on you and address any questions or concerns that you may have regarding the information given to you following your procedure. If we do not reach you, we will leave a message.  However, if you are feeling well and you are not experiencing any problems, there is no need to return our call.  We will assume that you have returned to your regular daily activities without incident.  If any biopsies were taken you will be contacted by phone or by letter within the next 1-3 weeks.  Please call us at [phone] if you have not heard about the biopsies in 3 weeks.    SIGNATURES/CONFIDENTIALITY: You and/or your care partner have  signed paperwork which will be entered into your electronic medical record.  These signatures attest to the fact that that the information above on your After Visit Summary has been reviewed and is understood.  Full responsibility of the confidentiality of this discharge information lies with you and/or your care-partner.

## 2018-05-28 NOTE — Op Note (Signed)
[facility] Patient Name: Albert Barr Procedure Date: 05/28/2018 2:13 PM MRN: [id number] Endoscopist: Gatha Mayer , MD Age: 77 Referring MD:  Date of Birth: Jan 04, 1941 Gender: Male Account #: [id number] Procedure:                Upper GI endoscopy Indications:              Surveillance procedure, Gastric polyps, Follow-up                            of gastric polyps in attenuated polyposis Medicines:                Propofol per Anesthesia, Monitored Anesthesia Care Procedure:                Pre-Anesthesia Assessment:                           - Prior to the procedure, a History and Physical                            was performed, and patient medications and                            allergies were reviewed. The patient's tolerance of                            previous anesthesia was also reviewed. The risks                            and benefits of the procedure and the sedation                            options and risks were discussed with the patient.                            All questions were answered, and informed consent                            was obtained. Prior Anticoagulants: The patient has                            taken no previous anticoagulant or antiplatelet                            agents. ASA Grade Assessment: III - A patient with                            severe systemic disease. After reviewing the risks                            and benefits, the patient was deemed in                            satisfactory condition to undergo the procedure.  After obtaining informed consent, the endoscope was                            passed under direct vision. Throughout the                            procedure, the patient's blood pressure, pulse, and                            oxygen saturations were monitored continuously. The                            Endoscope was introduced through the mouth, and             advanced to the second part of duodenum. The upper                            GI endoscopy was accomplished without difficulty.                            The patient tolerated the procedure well. Scope In: Scope Out: Findings:                 Multiple diminutive sessile polyps were found in                            the gastric fundus and in the gastric body.                            Biopsies were taken with a cold forceps for                            histology. Verification of patient identification                            for the specimen was done. Estimated blood loss was                            minimal.                           Diffuse mildly erythematous mucosa was found in the                            entire examined stomach.                           The exam was otherwise without abnormality.                           The cardia and gastric fundus were normal on                            retroflexion. Complications:            No immediate complications. Estimated Blood Loss:  Estimated blood loss was minimal. Impression:               - Multiple gastric polyps. Biopsied. All                            diminutive. Nothing suspicious.                           - Erythematous mucosa in the stomach.                           - The examination was otherwise normal. Recommendation:           - Patient has a contact number available for                            emergencies. The signs and symptoms of potential                            delayed complications were discussed with the                            patient. Return to normal activities tomorrow.                            Written discharge instructions were provided to the                            patient.                           - Resume previous diet.                           - Continue present medications.                           - See the other procedure note for documentation of                             additional recommendations. Gatha Mayer, MD 05/28/2018 3:13:51 PM This report has been signed electronically.

## 2018-05-28 NOTE — Progress Notes (Signed)
Called to room to assist during endoscopic procedure.  Patient ID and intended procedure confirmed with present staff. Received instructions for my participation in the procedure from the performing physician.  

## 2018-05-29 ENCOUNTER — Telehealth: Payer: Self-pay | Admitting: *Deleted

## 2018-05-29 NOTE — Telephone Encounter (Signed)
  Follow up Call-  Call back number 05/28/2018 12/09/2015  Post procedure Call Back phone  # [phone]  Permission to leave phone message Yes Yes  Some recent data might be hidden     Patient questions:  Do you have a fever, pain , or abdominal swelling? No. Pain Score  0 *  Have you tolerated food without any problems? Yes.    Have you been able to return to your normal activities? Yes.    Do you have any questions about your discharge instructions: Diet   No. Medications  No. Follow up visit  No.  Do you have questions or concerns about your Care? No.  Actions: * If pain score is 4 or above: No action needed, pain <4.

## 2018-06-05 ENCOUNTER — Encounter: Payer: Self-pay | Admitting: Internal Medicine

## 2018-06-05 NOTE — Progress Notes (Signed)
EGD - fudnic gland polyps - recall 2021 (office visit first) Colon 4 adenomas 1 ssp - recall 2021 - OV first

## 2018-08-06 ENCOUNTER — Other Ambulatory Visit: Payer: Self-pay | Admitting: Sports Medicine

## 2018-08-07 ENCOUNTER — Other Ambulatory Visit: Payer: Self-pay | Admitting: *Deleted

## 2018-08-07 MED ORDER — GABAPENTIN 300 MG PO CAPS: ORAL_CAPSULE | ORAL | 0 refills | Status: DC

## 2018-09-03 ENCOUNTER — Other Ambulatory Visit: Payer: Self-pay | Admitting: Sports Medicine

## 2018-09-18 ENCOUNTER — Ambulatory Visit
Admission: RE | Admit: 2018-09-18 | Discharge: 2018-09-18 | Disposition: A | Discharge status: Home / Self Care | Payer: Medicare Other | Source: Ambulatory Visit | Attending: Sports Medicine | Admitting: Sports Medicine

## 2018-09-18 ENCOUNTER — Telehealth (INDEPENDENT_AMBULATORY_CARE_PROVIDER_SITE_OTHER): Payer: Medicare Other | Admitting: Sports Medicine

## 2018-09-18 ENCOUNTER — Other Ambulatory Visit: Payer: Self-pay

## 2018-09-18 DIAGNOSIS — M25571 Pain in right ankle and joints of right foot: Secondary | ICD-10-CM

## 2018-09-18 NOTE — Progress Notes (Signed)
Patient ID: Albert Barr, male   DOB: [birth year], 78 y.o.   MRN: [id number]  Virtual Visit via Telephone Note  I connected with Albert Barr on 09/18/18 at 10:00 AM EDT by telephone and verified that I am speaking with the correct person using two identifiers.   I discussed the limitations, risks, security and privacy concerns of performing an evaluation and management service by telephone and the availability of in person appointments. I also discussed with the patient that there may be a patient responsible charge related to this service. The patient expressed understanding and agreed to proceed.   History of Present Illness: 78yo M who suffered a R ankle injury on 09/12/18. He was walking outside around his neighborhood and felt like his right leg gave out on him. He is unsure which way his ankle turned. He had difficulty getting up afterwards. No LOC. Immediately afterwards, he noted swelling and ecchymosis on the medial and lateral ankle. Since the injury, he has been having difficulty with weight bearing. He has been utilizing a walking stick and crutches for assisted ambulation. Pain is rated 5/10 and is worse with weight bearing. It improves with rest, ice, elevation, and Aleve at night. He has a hx of right sided foot drop and wears a posterior AFO on that side. He also has baseline neuropathy treated with gabapentin. He also has a hx of a tibial plateau on the right 6-72yrs ago treated non operatively. Denies any knee pain or foot pain. No hip or groin pain. No low back pain.   Observations/Objective: 4 pictures were reviewed that were taken and emailed by the patient. It demonstrates significant edema and medial/lateral ecchymosis of the foot. No bony deformity. No open fractures or lacerations. No erythema or skin mottling.  Wife performed palpation of the extremity endorsing no tenderness along the 5th metatarsal. No tenderness at the knee. Patient able to bear weight although  painful.  Assessment and Plan: 1. Acute lateral ankle injury, R 2. Hx of tibial plateau fx, R treated nonoperative  Follow Up Instructions: 3v R ankle x-ray ordered with his images and story concerning for fracture. Discussed operative vs non operative treatment pending results of the x-ray. Patient will go to [facility] for [facility]-rays and then come in for a nursing visit for a CAM walker vs referral to [facility] for further treatment. He may continue to take 2 Aleve 220mg  twice daily for pain and inflammation. Continue with rest, ice, elevation, and use of crutches as needed. Weight bearing as tolerated in CAM walker.   I discussed the assessment and treatment plan with the patient. The patient was provided an opportunity to ask questions and all were answered. The patient agreed with the plan and demonstrated an understanding of the instructions.   The patient was advised to call back or seek an in-person evaluation if the symptoms worsen or if the condition fails to improve as anticipated.  I provided 14 minutes of non-face-to-face time during this encounter.   Shellia Cleverly, DO

## 2018-10-02 ENCOUNTER — Other Ambulatory Visit: Payer: Self-pay | Admitting: Sports Medicine

## 2018-10-02 ENCOUNTER — Other Ambulatory Visit: Payer: Self-pay

## 2018-11-30 ENCOUNTER — Other Ambulatory Visit: Payer: Self-pay | Admitting: Family Medicine

## 2018-12-03 ENCOUNTER — Other Ambulatory Visit: Payer: Self-pay | Admitting: Sports Medicine

## 2018-12-18 ENCOUNTER — Other Ambulatory Visit: Payer: Self-pay

## 2018-12-18 ENCOUNTER — Ambulatory Visit (INDEPENDENT_AMBULATORY_CARE_PROVIDER_SITE_OTHER): Payer: Medicare Other | Admitting: Sports Medicine

## 2018-12-18 VITALS — BP 150/66 | Ht 70.0 in | Wt 285.0 lb

## 2018-12-18 DIAGNOSIS — R2241 Localized swelling, mass and lump, right lower limb: Secondary | ICD-10-CM | POA: Diagnosis not present

## 2018-12-18 DIAGNOSIS — R2242 Localized swelling, mass and lump, left lower limb: Secondary | ICD-10-CM

## 2018-12-18 DIAGNOSIS — M25476 Effusion, unspecified foot: Secondary | ICD-10-CM | POA: Diagnosis not present

## 2018-12-18 DIAGNOSIS — M25474 Effusion, right foot: Secondary | ICD-10-CM | POA: Insufficient documentation

## 2018-12-18 DIAGNOSIS — M25473 Effusion, unspecified ankle: Secondary | ICD-10-CM | POA: Diagnosis not present

## 2018-12-18 DIAGNOSIS — M25471 Effusion, right ankle: Secondary | ICD-10-CM | POA: Insufficient documentation

## 2018-12-18 DIAGNOSIS — M25475 Effusion, left foot: Secondary | ICD-10-CM | POA: Insufficient documentation

## 2018-12-18 NOTE — Progress Notes (Signed)
Albert Barr - 78 y.o. male MRN [id number]  Date of birth: [birth year]   Chief complaint: Left ankle swelling  SUBJECTIVE:    History of present illness: 78 year old male with a history of bilateral foot drop who presents today with a chief complaint of left ankle swelling.  He states that he has been currently managing a right-sided lateral malleolus fracture nonoperatively with Dr. Alfonso Ramus at [facility].  He has been in a cast for several weeks and then transition to a cam walker boot.  He is supposed to get the boot off this following Tuesday.  For the past 1 to 2 weeks he has noticed persistent foot and ankle swelling on the left side.  Upon questioning further, he has also noticed the same swelling on his right side.  In the past, he has had bilateral foot swelling which resolved spontaneously.  Now it appears it is persistent.  He denies any significant pain with the swelling.  No trauma or injury.  No recent surgeries to the lower extremities.  No history of kidney, cardiac, or liver disease.  No recent changes to his medications.  Of note, he has been on gabapentin for over 10 years for his neuropathy.  He does have a history of hypertension however has not required treatment for this and several years.  Denies any chest pain, palpitations or shortness of breath.  Denies any decreased urinary output.  No other explanation for the swelling.  No recent weight gain or weight loss.   Review of systems:  Per HPI; in addition no fever, no rash, no additional weakness, no additional numbness, no additional paresthesias, and no additional fall/injury   Interval past medical history, surgical history, family history, and social history obtained and are unchanged.   Of note, he has bilateral foot drop and a recent lateral malleolus fracture on the right side.  Medications reviewed and unchanged.  Of note, he is on gabapentin 600 mg twice daily. Allergies reviewed.  Of note he has  allergies to moxifloxacin and beclomethasone.  OBJECTIVE:  Physical exam: Vital signs are reviewed. BP (!) 150/66   Ht '5\' 10"'  (1.778 m)   Wt 285 lb (129.3 kg)   BMI 40.89 kg/m   Gen.: Alert, oriented, appears stated age, in no apparent distress HEENT: Moist oral mucosa Respiratory: Normal respirations, able to speak in full sentences Cardiac: Regular rate, distal pulses 2+ Integumentary: No rashes or erythema noted. Neurologic:  Bilateral foot drop noted with a AFO on the left leg.  Diminished sensation over the plantar aspect of both feet bilaterally. Gait: Antalgic although he is walking with a cam walker boot on his right side and has an AFO on his left leg Psych: Normal affect, mood is described as good Musculoskeletal: Inspection of his bilateral lower extremities demonstrates significant swelling.  He has +2 pitting edema in the pretibial region middle third of the tibia.  Distal pulses are +2 and intact.  No significant tenderness with calf squeeze.  No tenderness with passive dorsiflexion of the feet bilaterally.  Full range of motion and left ankle dorsiflexion plantarflexion.  Strength testing is intact.    ASSESSMENT & PLAN: Bilateral swelling of feet and ankles - reflex vs neurogenic vs dependent vs d/t medical reason (kidney, liver, heart) - CMP and BNP ordered today, unable to have labs to compare to - Thigh high compression stockings - recommend f/u with PCP for BP check - recommend increasing activity with walking program once out of the  boot - no red flag symptoms today - Wean off of gabapentin down to 369m bid for one week, then 303mdaily for one week, and stop - f/u in 2-4 weeks pending improvement off gabapentin  Orders Placed This Encounter  Procedures  . Comp Met (CMET)    Standing Status:   Future    Standing Expiration Date:   12/18/2019  . B Nat Peptide    Standing Status:   Future    Standing Expiration Date:   12/18/2019    AJClydene LamingDO Sports  Medicine Fellow CoSt Vincent Salem Hospital IncI observed and examined the patient with Dr. PiLunette Standsnd agree with assessment and plan.  Note reviewed and modified by me. KBIla McgillMD

## 2018-12-18 NOTE — Assessment & Plan Note (Addendum)
-   reflex vs neurogenic vs dependent vs d/t medical reason (kidney, liver, heart) - CMP and BNP ordered today, unable to have labs to compare to - Thigh high compression stockings - recommend f/u with PCP for BP check - recommend increasing activity with walking program once out of the boot - no red flag symptoms today - Wean off of gabapentin down to 300mg  bid for one week, then 300mg  daily for one week, and stop

## 2019-01-02 ENCOUNTER — Other Ambulatory Visit: Payer: Self-pay | Admitting: Sports Medicine

## 2019-01-15 ENCOUNTER — Ambulatory Visit (INDEPENDENT_AMBULATORY_CARE_PROVIDER_SITE_OTHER): Payer: Medicare Other | Admitting: Sports Medicine

## 2019-01-15 ENCOUNTER — Other Ambulatory Visit: Payer: Self-pay

## 2019-01-15 VITALS — BP 144/80 | Ht 69.5 in | Wt 290.0 lb

## 2019-01-15 DIAGNOSIS — M21371 Foot drop, right foot: Secondary | ICD-10-CM

## 2019-01-15 DIAGNOSIS — M25475 Effusion, left foot: Secondary | ICD-10-CM

## 2019-01-15 DIAGNOSIS — M25473 Effusion, unspecified ankle: Secondary | ICD-10-CM | POA: Diagnosis not present

## 2019-01-15 DIAGNOSIS — M21372 Foot drop, left foot: Secondary | ICD-10-CM

## 2019-01-15 DIAGNOSIS — M25476 Effusion, unspecified foot: Secondary | ICD-10-CM

## 2019-01-15 DIAGNOSIS — G629 Polyneuropathy, unspecified: Secondary | ICD-10-CM | POA: Diagnosis not present

## 2019-01-15 DIAGNOSIS — M25474 Effusion, right foot: Secondary | ICD-10-CM

## 2019-01-15 NOTE — Progress Notes (Signed)
CC: leg swelling and foot drop  Patient comes by for follow up Doing better with compression hose Less overall swelling and less lower leg pain Bilateral foot drop and able to walk with AFO with less risk of falling  Has had 3 months of limited walking due to fracture of RT medial malleolus Saw Dr. Alfonso Ramus and this has gradually healed Now feels weak and more unsteady  Back pain better controlled with exercises in chair as advised  Gets by with periodic morning ibuprofen Night time takes an aleve for pain relief  Past HX Asthma DDD and OA of lumbar spine Morbid obesity GERD  ROS Periodic LBP Pain in both feet and ankles when not using AFO  PE Obese, pleasant W M NAD BP (!) 144/80   Ht 5' 9.5" (1.765 m)   Wt 290 lb (131.5 kg)   BMI 42.21 kg/m   Both hips show limited ROM on IR No groing pain with testing Compression stockings seem to be keeping swelling at low level over ankles No dignificant TTP over RT medial malleolus Foot drop controlled well with AFO and no stumbling/ broad foot base

## 2019-01-15 NOTE — Assessment & Plan Note (Signed)
He needs to continue with AFO wear full time Unable to ambulate safely without these

## 2019-01-15 NOTE — Patient Instructions (Signed)
To stabilize your back:  Forward lean - reach to floor - in chair Isometric tightening of abdominals sitting - 5 repeats to count of 10 Sit straight - press low back against chair - 5 repeats to a count of 10  For legs Swelling is less if you have muscular motion You get less calf motion because of foot drop and AFOs Do some easy leg motion several times a day to help pump out fluid  Stationary bike would be good - consider semi-recumbent

## 2019-01-15 NOTE — Assessment & Plan Note (Signed)
Responding to compression stockings Suspect this is 2/2 neuropathy and cofactors of weight, Limited muscle motion in LEs  Continue follow up of medical issues but don't think they are trigger for the swelling

## 2019-01-15 NOTE — Assessment & Plan Note (Signed)
We discussed if bariatric surgery would be an option  I think his other medical issues are stable enough that he could discuss this with surgeon

## 2019-01-21 ENCOUNTER — Ambulatory Visit: Payer: Medicare Other | Attending: Sports Medicine | Admitting: Physical Therapy

## 2019-01-21 ENCOUNTER — Other Ambulatory Visit: Payer: Self-pay

## 2019-01-21 ENCOUNTER — Encounter: Payer: Self-pay | Admitting: Physical Therapy

## 2019-01-21 DIAGNOSIS — R262 Difficulty in walking, not elsewhere classified: Secondary | ICD-10-CM | POA: Insufficient documentation

## 2019-01-21 DIAGNOSIS — R2689 Other abnormalities of gait and mobility: Secondary | ICD-10-CM | POA: Insufficient documentation

## 2019-01-21 DIAGNOSIS — M6281 Muscle weakness (generalized): Secondary | ICD-10-CM | POA: Diagnosis present

## 2019-01-21 NOTE — Therapy (Signed)
[place], [place], [address] Phone: [phone]   Fax:  [phone]  Physical Therapy Evaluation  Patient Details  Name: Albert Barr MRN: [id number] Date of Birth: Dec 31, 1940 Referring Provider (PT): Stefanie Libel, MD   Encounter Date: 01/21/2019  PT End of Session - 01/21/19 1348    Visit Number  1    Number of Visits  12    Authorization Type  UHC/Medicare/Tricare    PT Start Time  1330    PT Stop Time  1415    PT Time Calculation (min)  45 min    Activity Tolerance  Patient tolerated treatment well    Behavior During Therapy  [facility] for tasks assessed/performed       Past Medical History:  Diagnosis Date  . Allergy   . Anal fissure   . Arthritis    shoulder, knees  . Asthma   . Atrophic gastritis without mention of hemorrhage   . Esophageal reflux   . Family history of gastric cancer   . Fibula fracture    hair line fracture  . Gastric polyps    history  . Hiatal hernia   . Hx of adenomatous colonic polyps   . Hyperlipidemia   . Metaplasia of esophagus 2011   "gastric metaplasia"  . Morbid obesity ([place])   . Obstructive sleep apnea on CPAP   . Other voice and resonance disorders   . Peripheral neuropathy   . Polyposis syndrome gastric and colonic - attenuated 12/09/2015  . Sleep apnea   . Squamous cell skin cancer 04/2018   lesion left forearm  . Unspecified asthma(493.90)     Past Surgical History:  Procedure Laterality Date  . APPENDECTOMY    . CHOLECYSTECTOMY    . COLONOSCOPY  08/20/2009 (multiple)   Tubular adenoma  . ESOPHAGOGASTRODUODENOSCOPY  08/20/09 (multiple)  . INCISION AND DRAINAGE PERIRECTAL ABSCESS  2007  . KNEE SURGERY     arthroscopy- both knees  . POLYPECTOMY    . UPPER GASTROINTESTINAL ENDOSCOPY      There were no vitals filed for this visit.   Subjective Assessment - 01/21/19 1333    Subjective  Pt arriving to therapy reporting fall in March 2020 where  he fractured his R ankle. Pt reporting being in a cast for about 12 weeks. Pt arriving wearing bilateral AFO's and amb with no assistive device.    Pertinent History  peripheral neuropathy, squamous cell skin cancer 04/2018, morbid obesity, asthma, arthritis, cataracts,  esophageal reflux, colon polyps, HTN (per pt report)    Limitations  Standing;Walking    How long can you walk comfortably?  5-10 minutes    Patient Stated Goals  Walk better, Be able to walk more than 2 laps around the "[facility]" inside tract    Currently in Pain?  Yes    Pain Score  2     Pain Location  Foot    Pain Orientation  Right    Pain Descriptors / Indicators  Burning;Aching    Pain Type  Chronic pain    Pain Onset  More than a month ago    Pain Frequency  Intermittent    Pain Relieving Factors  crossing my R left over my left    Effect of Pain on Daily Activities  Pt reporting difficutly with walking and transfers         [facility] PT Assessment - 01/21/19 0001      Assessment   Medical Diagnosis  R ankle pain, bil peripheral neuropathy    Referring Provider (PT)  Stefanie Libel, MD    Onset Date/Surgical Date  --   March 2020 s/p ankle fx   Hand Dominance  Right    Prior Therapy  2017 for peripheral neuropathy      Precautions   Precautions  None    Required Braces or Orthoses  --   bilateral AFO's     Restrictions   Weight Bearing Restrictions  No      Balance Screen   Has the patient fallen in the past 6 months  Yes    How many times?  1    Has the patient had a decrease in activity level because of a fear of falling?   Yes    Is the patient reluctant to leave their home because of a fear of falling?   Yes      [place] residence    Home Access  Stairs to enter    Entrance Stairs-Number of Steps  2    Entrance Stairs-Rails  Can reach both    [place]  One level    [place] - single point;Grab bars - toilet;Grab bars - tub/shower;Hand held  shower head      Prior Function   Level of Independence  Independent with basic ADLs    Leisure  walk, swimming, spending time with family and at church, going to [employer]   Overall Cognitive Status  Within Functional Limits for tasks assessed      Observation/Other Assessments   Focus on Therapeutic Outcomes (FOTO)   54 % limitation      Posture/Postural Control   Posture/Postural Control  Postural limitations    Postural Limitations  Rounded Shoulders;Forward head      ROM / Strength   AROM / PROM / Strength  Strength      Strength   Overall Strength Comments  Bilateral hips and knees grossly 4+/5, bilateral ankles grossly 2/5    Strength Assessment Site  Ankle      Transfers   Transfers  Sit to Stand    Sit to Stand  6: Modified independent (Device/Increase time)   pt needed chair to stabilize      Ambulation/Gait   Ambulation/Gait  Yes    Ambulation/Gait Assistance  7: Independent    Ambulation Distance (Feet)  500 Feet    Assistive device  None    Gait Pattern  Step-through pattern;Wide base of support;Poor foot clearance - left;Poor foot clearance - right      Standardized Balance Assessment   Standardized Balance Assessment  Berg Balance Test      Berg Balance Test   Sit to Stand  Able to stand  independently using hands    Standing Unsupported  Able to stand 30 seconds unsupported    Sitting with Back Unsupported but Feet Supported on Floor or Stool  Able to sit safely and securely 2 minutes    Stand to Sit  Uses backs of legs against chair to control descent    Transfers  Able to transfer safely, definite need of hands    Standing Unsupported with Eyes Closed  Able to stand 10 seconds with supervision    Standing Unsupported with Feet Together  Needs help to attain position but able to stand for 30 seconds with feet together    From Standing, Reach Forward with Outstretched  Arm  Reaches forward but needs supervision    From Standing Position,  Pick up Object from Floor  Unable to try/needs assist to keep balance    From Standing Position, Turn to Look Behind Over each Shoulder  Needs assist to keep from losing balance and falling    Turn 360 Degrees  Needs assistance while turning    Standing Unsupported, Alternately Place Feet on Step/Stool  Needs assistance to keep from falling or unable to try    Standing Unsupported, One Foot in [place] balance while stepping or standing    Standing on One Leg  Unable to try or needs assist to prevent fall    Total Score  19                Objective measurements completed on examination: See above findings.              PT Education - 01/21/19 1448    Education Details  Discussed continued exercises in the pool (marching, hip abduction, calf raises) all holding onto the side of the pool, Discussed PT POC    Person(s) Educated  Patient    Methods  Explanation;Demonstration;Handout    Comprehension  Verbalized understanding;Returned demonstration       PT Short Term Goals - 01/21/19 1351      PT SHORT TERM GOAL #1   Title  Patient indepdent with his initial HEP.    Baseline  initial HEP issued today    Time  3    Period  Weeks    Status  New    Target Date  02/12/19      PT SHORT TERM GOAL #2   Title  -      PT SHORT TERM GOAL #3   Title  -        PT Long Term Goals - 01/21/19 1353      PT LONG TERM GOAL #1   Title  Patient verbalizes / demonstrates understanding of ongoing HEP / fitness plan. (01/21/2019)    Time  6    Period  Weeks    Status  New      PT LONG TERM GOAL #2   Title  Patient ambulates with AFOs & 1000 feet modified independent.    Baseline  amb short distances with bilateral AFO's 01/21/2019    Time  6    Period  Weeks    Status  New    Target Date  03/04/19      PT LONG TERM GOAL #3   Title  Patient negotiates ramps, curbs & stairs with  AFOs  independent.  (01/21/2019)    Baseline  Pt reporting difficulty with stairs  and  ramps    Time  6    Period  Weeks    Status  New      PT LONG TERM GOAL #4   Title  Berg Balance >35/56 to indicate lower fall risk.  (01/21/2019)    Baseline  Initial BERG at eval 19/56 on 01/21/2019    Time  6    Period  Weeks      PT LONG TERM GOAL #5   Title  Pt will improve his FOTO score from 54% limitation to </= 43% limitation.    Baseline  54% limitation on 01/21/2019    Time  6    Period  Weeks    Status  New  Plan - 01/21/19 1455    Clinical Impression Statement  Pt arriving to therapy reporting mild pain of 2-3/10. Pt reporting recent ankle fracture in March 2020. Pt reported he was in 3 different types of casts for 12 weeks. Pt with h/o peripheral neuropathy and bilateral AFO's. Pt amb with wide BOS and decreased foot clearance with step through gait pattern. Pt's bilateral LE strength in hip's and knees is 4+/5. Bilateral ankles grossly 2/5.  How ever pt did report fatigue after walking from the parking lot the front door. BERG balance score was 19/56. Pt requiring skilled PT to address the listed impairments with the below interventions.    Personal Factors and Comorbidities  Comorbidity 3+    Comorbidities  R ankle fx 08/2018,  knee surgery, peripherial neuropathy, arthritis, asthma, morbid obesity, squamous cell skin CA 04/2018, esophgeal reflux, polyps    Examination-Activity Limitations  Transfers;Stairs;Stand    Examination-Participation Restrictions  Community Activity;Church    Stability/Clinical Decision Making  Stable/Uncomplicated    Clinical Decision Making  Moderate    Rehab Potential  Good    PT Frequency  2x / week    PT Duration  6 weeks    PT Treatment/Interventions  ADLs/Self Care Home Management;Gait training;Stair training;Functional mobility training;Therapeutic activities;Therapeutic exercise;Balance training;Neuromuscular re-education;Patient/family education;Manual techniques;Passive range of motion;Energy conservation    PT Next  Visit Plan  LE exercises, Nustep, standing balance exercises    PT Home Exercise Plan  sit to stand instructions, continued pool exercises ( marching, hip abd, calf raises)    Consulted and Agree with Plan of Care  Patient       Patient will benefit from skilled therapeutic intervention in order to improve the following deficits and impairments:  Difficulty walking, Decreased balance, Decreased mobility, Obesity, Pain, Decreased activity tolerance  Visit Diagnosis: 1. Difficulty in walking, not elsewhere classified   2. Muscle weakness (generalized)   3. Other abnormalities of gait and mobility        Problem List Patient Active Problem List   Diagnosis Date Noted  . Bilateral swelling of feet and ankles 12/18/2018  . Acute right ankle pain 09/18/2018  . Foot drop, bilateral 01/25/2018  . Morbid obesity with body mass index of 40.0-44.9 in adult ([place]) 12/04/2017  . Genetic testing 03/03/2016  . Polyposis syndrome gastric and colonic - attenuated 12/09/2015  . Pre-operative clearance 11/13/2015  . Essential hypertension 11/13/2015  . NS (nuclear sclerosis) 03/11/2015  . Degenerative arthritis of lumbar spine 09/03/2014  . Lumbar and sacral osteoarthritis 09/03/2014  . Ocular rosacea 08/19/2014  . Incomplete rotator cuff tear 07/22/2014  . Conjunctival chalasis 02/12/2014  . Dry eye syndrome 02/12/2014  . Dry eye 02/12/2014  . 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 Disease of eyelid 02/12/2014  . SPL (spondylolisthesis) 11/27/2013  . Family history of gastric cancer 05/16/2013  . Family history of cancer of digestive organ 05/16/2013  . Imbalance 06/14/2012  . Peripheral neuropathic pain 06/14/2012  . Neuropathy 06/14/2012  . Glaucoma suspect 11/03/2011  . Cataract, nuclear 11/03/2011  . Posterior vitreous detachment 11/03/2011  . Hole, retinal 11/03/2011  . Ache in joint 05/26/2011  . Gastro-esophageal reflux disease without esophagitis 05/26/2011  . Acid reflux  05/26/2011  . Hypercholesterolemia 05/26/2011  . Hyperlipidemia 05/27/2009  . Morbid obesity ([place]) 05/27/2009  . GERD 09/11/2008  . History of colonic polyps 09/11/2008  . Multiple gastric polyps 09/11/2008  . H/O disease 09/11/2008  . HOARSENESS 09/10/2008  . SLEEP APNEA 11/05/2007  . ASTHMA 10/12/2007  .  Airway hyperreactivity 10/12/2007  . GASTRITIS, CHRONIC 08/01/2006  . AG (atrophic gastritis) 08/01/2006    Oretha Caprice, PT 01/21/2019, 3:21 PM  [facility] [address] [place], [place], [address] Phone: [phone]   Fax:  [phone]  Name: ROCKO FESPERMAN MRN: [id number] Date of Birth: [birth year]

## 2019-01-21 NOTE — Patient Instructions (Signed)
Sit to stand:  Stand up using both arms to push as needed. Make sure to scoot forward before standing to make sure your legs are not stabilizing on the chair.  Once standing and balance is reached hold position 5-10 seconds Then return to sitting Repeat 10 times, twice daily

## 2019-01-29 ENCOUNTER — Other Ambulatory Visit: Payer: Self-pay | Admitting: Sports Medicine

## 2019-01-31 ENCOUNTER — Encounter: Payer: Self-pay | Admitting: Physical Therapy

## 2019-01-31 ENCOUNTER — Other Ambulatory Visit: Payer: Self-pay

## 2019-01-31 ENCOUNTER — Ambulatory Visit: Payer: Medicare Other | Attending: Sports Medicine | Admitting: Physical Therapy

## 2019-01-31 ENCOUNTER — Encounter

## 2019-01-31 DIAGNOSIS — R262 Difficulty in walking, not elsewhere classified: Secondary | ICD-10-CM

## 2019-01-31 DIAGNOSIS — M6281 Muscle weakness (generalized): Secondary | ICD-10-CM

## 2019-01-31 DIAGNOSIS — R2689 Other abnormalities of gait and mobility: Secondary | ICD-10-CM | POA: Insufficient documentation

## 2019-01-31 NOTE — Therapy (Signed)
[place], [place], [address] Phone: [phone]   Fax:  [phone]  Physical Therapy Treatment  Patient Details  Name: Albert Barr MRN: [id number] Date of Birth: 26-Jun-1941 Referring Provider (PT): Stefanie Libel, MD   Encounter Date: 01/31/2019  PT End of Session - 01/31/19 0946    Visit Number  2    Number of Visits  12    Authorization Type  UHC/Medicare/Tricare    PT Start Time  0933    PT Stop Time  1015    PT Time Calculation (min)  42 min    Activity Tolerance  Patient tolerated treatment well    Behavior During Therapy  [facility] for tasks assessed/performed       Past Medical History:  Diagnosis Date  . Allergy   . Anal fissure   . Arthritis    shoulder, knees  . Asthma   . Atrophic gastritis without mention of hemorrhage   . Esophageal reflux   . Family history of gastric cancer   . Fibula fracture    hair line fracture  . Gastric polyps    history  . Hiatal hernia   . Hx of adenomatous colonic polyps   . Hyperlipidemia   . Metaplasia of esophagus 2011   "gastric metaplasia"  . Morbid obesity ([place])   . Obstructive sleep apnea on CPAP   . Other voice and resonance disorders   . Peripheral neuropathy   . Polyposis syndrome gastric and colonic - attenuated 12/09/2015  . Sleep apnea   . Squamous cell skin cancer 04/2018   lesion left forearm  . Unspecified asthma(493.90)     Past Surgical History:  Procedure Laterality Date  . APPENDECTOMY    . CHOLECYSTECTOMY    . COLONOSCOPY  08/20/2009 (multiple)   Tubular adenoma  . ESOPHAGOGASTRODUODENOSCOPY  08/20/09 (multiple)  . INCISION AND DRAINAGE PERIRECTAL ABSCESS  2007  . KNEE SURGERY     arthroscopy- both knees  . POLYPECTOMY    . UPPER GASTROINTESTINAL ENDOSCOPY      There were no vitals filed for this visit.  Subjective Assessment - 01/31/19 0936    Subjective  My legs felt like I was having trouble with stability after  riding bike so I have not done exercises for last 2 days. Left leg has been showing more swelling than Rt. I do not want to use a cane.    Patient Stated Goals  Walk better, Be able to walk more than 2 laps around the "[facility]" inside tract                       [facility] Adult PT Treatment/Exercise - 01/31/19 0001      Neuro Re-ed    Neuro Re-ed Details   stnding static balance with PT guarding SBA-MaxA; weight shifting all directions      Exercises   Exercises  Knee/Hip      Knee/Hip Exercises: Seated   Other Seated Knee/Hip Exercises  isometrics- abd/add, glut sets, knee flexion, quad set, knee flx/ext               PT Short Term Goals - 01/21/19 1351      PT SHORT TERM GOAL #1   Title  Patient indepdent with his initial HEP.    Baseline  initial HEP issued today    Time  3    Period  Weeks    Status  New  Target Date  02/12/19      PT SHORT TERM GOAL #2   Title  -      PT SHORT TERM GOAL #3   Title  -        PT Long Term Goals - 01/21/19 1353      PT LONG TERM GOAL #1   Title  Patient verbalizes / demonstrates understanding of ongoing HEP / fitness plan. (01/21/2019)    Time  6    Period  Weeks    Status  New      PT LONG TERM GOAL #2   Title  Patient ambulates with AFOs & 1000 feet modified independent.    Baseline  amb short distances with bilateral AFO's 01/21/2019    Time  6    Period  Weeks    Status  New    Target Date  03/04/19      PT LONG TERM GOAL #3   Title  Patient negotiates ramps, curbs & stairs with  AFOs  independent.  (01/21/2019)    Baseline  Pt reporting difficulty with stairs  and ramps    Time  6    Period  Weeks    Status  New      PT LONG TERM GOAL #4   Title  Berg Balance >35/56 to indicate lower fall risk.  (01/21/2019)    Baseline  Initial BERG at eval 19/56 on 01/21/2019    Time  6    Period  Weeks      PT LONG TERM GOAL #5   Title  Pt will improve his FOTO score from 54% limitation to </= 43% limitation.     Baseline  54% limitation on 01/21/2019    Time  6    Period  Weeks    Status  New            Plan - 01/31/19 1015    Clinical Impression Statement  Added isometrics for exercise to do when fatigued or sitting for long periods. Assistance required for balance support.    PT Treatment/Interventions  ADLs/Self Care Home Management;Gait training;Stair training;Functional mobility training;Therapeutic activities;Therapeutic exercise;Balance training;Neuromuscular re-education;Patient/family education;Manual techniques;Passive range of motion;Energy conservation    PT Next Visit Plan  standing balance, nustep    PT Home Exercise Plan  sit to stand instructions, continued pool exercises ( marching, hip abd, calf raises); hip isometrics; 2x86min bike    Consulted and Agree with Plan of Care  Patient       Patient will benefit from skilled therapeutic intervention in order to improve the following deficits and impairments:  Difficulty walking, Decreased balance, Decreased mobility, Obesity, Pain, Decreased activity tolerance  Visit Diagnosis: 1. Difficulty in walking, not elsewhere classified   2. Muscle weakness (generalized)   3. Other abnormalities of gait and mobility        Problem List Patient Active Problem List   Diagnosis Date Noted  . Bilateral swelling of feet and ankles 12/18/2018  . Acute right ankle pain 09/18/2018  . Foot drop, bilateral 01/25/2018  . Morbid obesity with body mass index of 40.0-44.9 in adult ([place]) 12/04/2017  . Genetic testing 03/03/2016  . Polyposis syndrome gastric and colonic - attenuated 12/09/2015  . Pre-operative clearance 11/13/2015  . Essential hypertension 11/13/2015  . NS (nuclear sclerosis) 03/11/2015  . Degenerative arthritis of lumbar spine 09/03/2014  . Lumbar and sacral osteoarthritis 09/03/2014  . Ocular rosacea 08/19/2014  . Incomplete rotator cuff tear 07/22/2014  . Conjunctival chalasis  02/12/2014  . Dry eye syndrome  02/12/2014  . Dry eye 02/12/2014  . 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 Disease of eyelid 02/12/2014  . SPL (spondylolisthesis) 11/27/2013  . Family history of gastric cancer 05/16/2013  . Family history of cancer of digestive organ 05/16/2013  . Imbalance 06/14/2012  . Peripheral neuropathic pain 06/14/2012  . Neuropathy 06/14/2012  . Glaucoma suspect 11/03/2011  . Cataract, nuclear 11/03/2011  . Posterior vitreous detachment 11/03/2011  . Hole, retinal 11/03/2011  . Ache in joint 05/26/2011  . Gastro-esophageal reflux disease without esophagitis 05/26/2011  . Acid reflux 05/26/2011  . Hypercholesterolemia 05/26/2011  . Hyperlipidemia 05/27/2009  . Morbid obesity ([place]) 05/27/2009  . GERD 09/11/2008  . History of colonic polyps 09/11/2008  . Multiple gastric polyps 09/11/2008  . H/O disease 09/11/2008  . HOARSENESS 09/10/2008  . SLEEP APNEA 11/05/2007  . ASTHMA 10/12/2007  . Airway hyperreactivity 10/12/2007  . GASTRITIS, CHRONIC 08/01/2006  . AG (atrophic gastritis) 08/01/2006    Senia Even C. Jazia Faraci PT, DPT 01/31/19 12:46 PM   [facility] [address] [place], [place], [address] Phone: [phone]   Fax:  [phone]  Name: AIREN STIEHL MRN: [id number] Date of Birth: 01-21-1941

## 2019-02-05 ENCOUNTER — Other Ambulatory Visit: Payer: Self-pay

## 2019-02-05 ENCOUNTER — Ambulatory Visit: Payer: Medicare Other

## 2019-02-05 DIAGNOSIS — R262 Difficulty in walking, not elsewhere classified: Secondary | ICD-10-CM | POA: Diagnosis not present

## 2019-02-05 DIAGNOSIS — M6281 Muscle weakness (generalized): Secondary | ICD-10-CM

## 2019-02-05 DIAGNOSIS — R2689 Other abnormalities of gait and mobility: Secondary | ICD-10-CM

## 2019-02-05 NOTE — Therapy (Signed)
[place], [place], [address] Phone: [phone]   Fax:  [phone]  Physical Therapy Treatment  Patient Details  Name: Albert Barr MRN: [id number] Date of Birth: 1940/11/17 Referring Provider (PT): Stefanie Libel, MD   Encounter Date: 02/05/2019  PT End of Session - 02/05/19 1402    Visit Number  3    Number of Visits  12    Authorization Type  UHC/Medicare/Tricare    PT Start Time  0201    PT Stop Time  0245    PT Time Calculation (min)  44 min    Activity Tolerance  Patient tolerated treatment well    Behavior During Therapy  [facility] for tasks assessed/performed       Past Medical History:  Diagnosis Date  . Allergy   . Anal fissure   . Arthritis    shoulder, knees  . Asthma   . Atrophic gastritis without mention of hemorrhage   . Esophageal reflux   . Family history of gastric cancer   . Fibula fracture    hair line fracture  . Gastric polyps    history  . Hiatal hernia   . Hx of adenomatous colonic polyps   . Hyperlipidemia   . Metaplasia of esophagus 2011   "gastric metaplasia"  . Morbid obesity ([place])   . Obstructive sleep apnea on CPAP   . Other voice and resonance disorders   . Peripheral neuropathy   . Polyposis syndrome gastric and colonic - attenuated 12/09/2015  . Sleep apnea   . Squamous cell skin cancer 04/2018   lesion left forearm  . Unspecified asthma(493.90)     Past Surgical History:  Procedure Laterality Date  . APPENDECTOMY    . CHOLECYSTECTOMY    . COLONOSCOPY  08/20/2009 (multiple)   Tubular adenoma  . ESOPHAGOGASTRODUODENOSCOPY  08/20/09 (multiple)  . INCISION AND DRAINAGE PERIRECTAL ABSCESS  2007  . KNEE SURGERY     arthroscopy- both knees  . POLYPECTOMY    . UPPER GASTROINTESTINAL ENDOSCOPY      There were no vitals filed for this visit.  Subjective Assessment - 02/05/19 1456    Subjective  no changes since last session.  Talked about bariatric  surgery and purchasing a nustep.    Pain Score  2     Pain Location  Foot    Pain Orientation  Left    Pain Descriptors / Indicators  Aching    Pain Type  Chronic pain    Pain Onset  More than a month ago    Pain Frequency  Intermittent                       [facility] Adult PT Treatment/Exercise - 02/05/19 0001      Self-Care   Self-Care  Other Self-Care Comments    Other Self-Care Comments   purchase of bike or other equipment to facilitae strength and endurance at hoem we reviewed equipment in gym  and options to make this safe  for hime as he is heavy.     standing balance at home wiht use of door frame to be able to support self it teetering to Rt or LT.       Neuro Re-ed    Neuro Re-ed Details   standing static balance with PT guarding SBA-MaxA; weight shifting all directions and in step stnading. Walkking with each foot touching red line in bars with touch to rails and steppin  across each foot  for weight shift and narrow BOS      Knee/Hip Exercises: Aerobic   Nustep  L5 5 min LE only      Knee/Hip Exercises: Standing   Side Lunges  Right;Left;20 reps               PT Short Term Goals - 01/21/19 1351      PT SHORT TERM GOAL #1   Title  Patient indepdent with his initial HEP.    Baseline  initial HEP issued today    Time  3    Period  Weeks    Status  New    Target Date  02/12/19      PT SHORT TERM GOAL #2   Title  -      PT SHORT TERM GOAL #3   Title  -        PT Long Term Goals - 01/21/19 1353      PT LONG TERM GOAL #1   Title  Patient verbalizes / demonstrates understanding of ongoing HEP / fitness plan. (01/21/2019)    Time  6    Period  Weeks    Status  New      PT LONG TERM GOAL #2   Title  Patient ambulates with AFOs & 1000 feet modified independent.    Baseline  amb short distances with bilateral AFO's 01/21/2019    Time  6    Period  Weeks    Status  New    Target Date  03/04/19      PT LONG TERM GOAL #3   Title  Patient  negotiates ramps, curbs & stairs with  AFOs  independent.  (01/21/2019)    Baseline  Pt reporting difficulty with stairs  and ramps    Time  6    Period  Weeks    Status  New      PT LONG TERM GOAL #4   Title  Berg Balance >35/56 to indicate lower fall risk.  (01/21/2019)    Baseline  Initial BERG at eval 19/56 on 01/21/2019    Time  6    Period  Weeks      PT LONG TERM GOAL #5   Title  Pt will improve his FOTO score from 54% limitation to </= 43% limitation.    Baseline  54% limitation on 01/21/2019    Time  6    Period  Weeks    Status  New            Plan - 02/05/19 1403    Clinical Impression Statement  Significant decr balance with decr BOS  with knees flexing at times but no LOB/fall. No significant changes so far    PT Treatment/Interventions  ADLs/Self Care Home Management;Gait training;Stair training;Functional mobility training;Therapeutic activities;Therapeutic exercise;Balance training;Neuromuscular re-education;Patient/family education;Manual techniques;Passive range of motion;Energy conservation    PT Next Visit Plan  standing balance,   strength hips    PT Home Exercise Plan  sit to stand instructions, continued pool exercises ( marching, hip abd, calf raises); hip isometrics; 2x74min bike    Consulted and Agree with Plan of Care  Patient       Patient will benefit from skilled therapeutic intervention in order to improve the following deficits and impairments:  Difficulty walking, Decreased balance, Decreased mobility, Obesity, Pain, Decreased activity tolerance  Visit Diagnosis: 1. Muscle weakness (generalized)   2. Other abnormalities of gait and mobility   3. Difficulty in walking, not  elsewhere classified        Problem List Patient Active Problem List   Diagnosis Date Noted  . Bilateral swelling of feet and ankles 12/18/2018  . Acute right ankle pain 09/18/2018  . Foot drop, bilateral 01/25/2018  . Morbid obesity with body mass index of 40.0-44.9  in adult ([place]) 12/04/2017  . Genetic testing 03/03/2016  . Polyposis syndrome gastric and colonic - attenuated 12/09/2015  . Pre-operative clearance 11/13/2015  . Essential hypertension 11/13/2015  . NS (nuclear sclerosis) 03/11/2015  . Degenerative arthritis of lumbar spine 09/03/2014  . Lumbar and sacral osteoarthritis 09/03/2014  . Ocular rosacea 08/19/2014  . Incomplete rotator cuff tear 07/22/2014  . Conjunctival chalasis 02/12/2014  . Dry eye syndrome 02/12/2014  . Dry eye 02/12/2014  . 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 Disease of eyelid 02/12/2014  . SPL (spondylolisthesis) 11/27/2013  . Family history of gastric cancer 05/16/2013  . Family history of cancer of digestive organ 05/16/2013  . Imbalance 06/14/2012  . Peripheral neuropathic pain 06/14/2012  . Neuropathy 06/14/2012  . Glaucoma suspect 11/03/2011  . Cataract, nuclear 11/03/2011  . Posterior vitreous detachment 11/03/2011  . Hole, retinal 11/03/2011  . Ache in joint 05/26/2011  . Gastro-esophageal reflux disease without esophagitis 05/26/2011  . Acid reflux 05/26/2011  . Hypercholesterolemia 05/26/2011  . Hyperlipidemia 05/27/2009  . Morbid obesity ([place]) 05/27/2009  . GERD 09/11/2008  . History of colonic polyps 09/11/2008  . Multiple gastric polyps 09/11/2008  . H/O disease 09/11/2008  . HOARSENESS 09/10/2008  . SLEEP APNEA 11/05/2007  . ASTHMA 10/12/2007  . Airway hyperreactivity 10/12/2007  . GASTRITIS, CHRONIC 08/01/2006  . AG (atrophic gastritis) 08/01/2006    Albert Barr  PT 02/05/2019, 2:57 PM  [facility] [address] [place], [place], [address] Phone: [phone]   Fax:  [phone]  Name: Albert Barr MRN: [id number] Date of Birth: 29-May-1941

## 2019-02-06 ENCOUNTER — Encounter: Payer: Self-pay | Admitting: Physical Therapy

## 2019-02-06 ENCOUNTER — Ambulatory Visit: Payer: Medicare Other | Admitting: Physical Therapy

## 2019-02-06 DIAGNOSIS — R262 Difficulty in walking, not elsewhere classified: Secondary | ICD-10-CM | POA: Diagnosis not present

## 2019-02-06 DIAGNOSIS — M6281 Muscle weakness (generalized): Secondary | ICD-10-CM

## 2019-02-06 DIAGNOSIS — R2689 Other abnormalities of gait and mobility: Secondary | ICD-10-CM

## 2019-02-06 NOTE — Therapy (Signed)
[place], [place], [address] Phone: [phone]   Fax:  [phone]  Physical Therapy Treatment  Patient Details  Name: Albert Barr MRN: [id number] Date of Birth: 03-Nov-1940 Referring Provider (PT): Stefanie Libel, MD   Encounter Date: 02/06/2019  PT End of Session - 02/06/19 1153    Visit Number  4    Number of Visits  12    Authorization Type  UHC/Medicare/Tricare    PT Start Time  1146    PT Stop Time  1217    PT Time Calculation (min)  31 min    Activity Tolerance  Patient tolerated treatment well    Behavior During Therapy  [facility] for tasks assessed/performed       Past Medical History:  Diagnosis Date  . Allergy   . Anal fissure   . Arthritis    shoulder, knees  . Asthma   . Atrophic gastritis without mention of hemorrhage   . Esophageal reflux   . Family history of gastric cancer   . Fibula fracture    hair line fracture  . Gastric polyps    history  . Hiatal hernia   . Hx of adenomatous colonic polyps   . Hyperlipidemia   . Metaplasia of esophagus 2011   "gastric metaplasia"  . Morbid obesity ([place])   . Obstructive sleep apnea on CPAP   . Other voice and resonance disorders   . Peripheral neuropathy   . Polyposis syndrome gastric and colonic - attenuated 12/09/2015  . Sleep apnea   . Squamous cell skin cancer 04/2018   lesion left forearm  . Unspecified asthma(493.90)     Past Surgical History:  Procedure Laterality Date  . APPENDECTOMY    . CHOLECYSTECTOMY    . COLONOSCOPY  08/20/2009 (multiple)   Tubular adenoma  . ESOPHAGOGASTRODUODENOSCOPY  08/20/09 (multiple)  . INCISION AND DRAINAGE PERIRECTAL ABSCESS  2007  . KNEE SURGERY     arthroscopy- both knees  . POLYPECTOMY    . UPPER GASTROINTESTINAL ENDOSCOPY      There were no vitals filed for this visit.  Subjective Assessment - 02/06/19 1151    Subjective  Just the typical arthritis pain.    Patient Stated Goals   Walk better, Be able to walk more than 2 laps around the "[facility]" inside tract         [facility] PT Assessment - 02/06/19 0001      Assessment   Medical Diagnosis  R ankle pain, bil peripheral neuropathy    Referring Provider (PT)  Stefanie Libel, MD                   [facility] Adult PT Treatment/Exercise - 02/06/19 0001      Knee/Hip Exercises: Aerobic   Nustep  L5 6 min LE only      Knee/Hip Exercises: Standing   Forward Step Up Limitations  step taps 4"    Other Standing Knee Exercises  fwd & back steps with return    Other Standing Knee Exercises  wide tandem balance               PT Short Term Goals - 01/21/19 1351      PT SHORT TERM GOAL #1   Title  Patient indepdent with his initial HEP.    Baseline  initial HEP issued today    Time  3    Period  Weeks    Status  New  Target Date  02/12/19      PT SHORT TERM GOAL #2   Title  -      PT SHORT TERM GOAL #3   Title  -        PT Long Term Goals - 01/21/19 1353      PT LONG TERM GOAL #1   Title  Patient verbalizes / demonstrates understanding of ongoing HEP / fitness plan. (01/21/2019)    Time  6    Period  Weeks    Status  New      PT LONG TERM GOAL #2   Title  Patient ambulates with AFOs & 1000 feet modified independent.    Baseline  amb short distances with bilateral AFO's 01/21/2019    Time  6    Period  Weeks    Status  New    Target Date  03/04/19      PT LONG TERM GOAL #3   Title  Patient negotiates ramps, curbs & stairs with  AFOs  independent.  (01/21/2019)    Baseline  Pt reporting difficulty with stairs  and ramps    Time  6    Period  Weeks    Status  New      PT LONG TERM GOAL #4   Title  Berg Balance >35/56 to indicate lower fall risk.  (01/21/2019)    Baseline  Initial BERG at eval 19/56 on 01/21/2019    Time  6    Period  Weeks      PT LONG TERM GOAL #5   Title  Pt will improve his FOTO score from 54% limitation to </= 43% limitation.    Baseline  54% limitation on  01/21/2019    Time  6    Period  Weeks    Status  New            Plan - 02/06/19 1239    Clinical Impression Statement  Balance challenges in parallel bars today. Cues for increasing BOS as he tends to step on the heels of his shoes. Worked on constant, smoothe motion for balance control. Rest breaks monitored.    PT Treatment/Interventions  ADLs/Self Care Home Management;Gait training;Stair training;Functional mobility training;Therapeutic activities;Therapeutic exercise;Balance training;Neuromuscular re-education;Patient/family education;Manual techniques;Passive range of motion;Energy conservation    PT Next Visit Plan  continue standing balance, wall squats, lateral stepping    PT Home Exercise Plan  sit to stand instructions, continued pool exercises ( marching, hip abd, calf raises); hip isometrics; 2x56min bike    Consulted and Agree with Plan of Care  Patient       Patient will benefit from skilled therapeutic intervention in order to improve the following deficits and impairments:  Difficulty walking, Decreased balance, Decreased mobility, Obesity, Pain, Decreased activity tolerance  Visit Diagnosis: 1. Muscle weakness (generalized)   2. Other abnormalities of gait and mobility   3. Difficulty in walking, not elsewhere classified        Problem List Patient Active Problem List   Diagnosis Date Noted  . Bilateral swelling of feet and ankles 12/18/2018  . Acute right ankle pain 09/18/2018  . Foot drop, bilateral 01/25/2018  . Morbid obesity with body mass index of 40.0-44.9 in adult ([place]) 12/04/2017  . Genetic testing 03/03/2016  . Polyposis syndrome gastric and colonic - attenuated 12/09/2015  . Pre-operative clearance 11/13/2015  . Essential hypertension 11/13/2015  . NS (nuclear sclerosis) 03/11/2015  . Degenerative arthritis of lumbar spine 09/03/2014  . Lumbar and sacral  osteoarthritis 09/03/2014  . Ocular rosacea 08/19/2014  . Incomplete rotator cuff tear  07/22/2014  . Conjunctival chalasis 02/12/2014  . Dry eye syndrome 02/12/2014  . Dry eye 02/12/2014  . 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 Disease of eyelid 02/12/2014  . SPL (spondylolisthesis) 11/27/2013  . Family history of gastric cancer 05/16/2013  . Family history of cancer of digestive organ 05/16/2013  . Imbalance 06/14/2012  . Peripheral neuropathic pain 06/14/2012  . Neuropathy 06/14/2012  . Glaucoma suspect 11/03/2011  . Cataract, nuclear 11/03/2011  . Posterior vitreous detachment 11/03/2011  . Hole, retinal 11/03/2011  . Ache in joint 05/26/2011  . Gastro-esophageal reflux disease without esophagitis 05/26/2011  . Acid reflux 05/26/2011  . Hypercholesterolemia 05/26/2011  . Hyperlipidemia 05/27/2009  . Morbid obesity ([place]) 05/27/2009  . GERD 09/11/2008  . History of colonic polyps 09/11/2008  . Multiple gastric polyps 09/11/2008  . H/O disease 09/11/2008  . HOARSENESS 09/10/2008  . SLEEP APNEA 11/05/2007  . ASTHMA 10/12/2007  . Airway hyperreactivity 10/12/2007  . GASTRITIS, CHRONIC 08/01/2006  . AG (atrophic gastritis) 08/01/2006    Airik Goodlin C. Tamiah Dysart PT, DPT 02/06/19 12:42 PM   [facility] [address] [place], [place], [address] Phone: [phone]   Fax:  [phone]  Name: Albert Barr MRN: [id number] Date of Birth: 08/03/1940

## 2019-02-11 ENCOUNTER — Ambulatory Visit: Payer: Medicare Other

## 2019-02-13 ENCOUNTER — Other Ambulatory Visit: Payer: Self-pay

## 2019-02-13 ENCOUNTER — Encounter: Payer: Self-pay | Admitting: Physical Therapy

## 2019-02-13 ENCOUNTER — Ambulatory Visit: Payer: Medicare Other | Admitting: Physical Therapy

## 2019-02-13 DIAGNOSIS — R262 Difficulty in walking, not elsewhere classified: Secondary | ICD-10-CM

## 2019-02-13 DIAGNOSIS — M6281 Muscle weakness (generalized): Secondary | ICD-10-CM

## 2019-02-13 DIAGNOSIS — R2689 Other abnormalities of gait and mobility: Secondary | ICD-10-CM

## 2019-02-13 NOTE — Therapy (Signed)
[place], [place], [address] Phone: [phone]   Fax:  [phone]  Physical Therapy Treatment  Patient Details  Name: Albert Barr MRN: [id number] Date of Birth: 26-Oct-1940 Referring Provider (PT): Stefanie Libel, MD   Encounter Date: 02/13/2019  PT End of Session - 02/13/19 1512    Visit Number  5    Number of Visits  12    Authorization Type  UHC/Medicare/Tricare    PT Start Time  1508    PT Stop Time  1541    PT Time Calculation (min)  33 min    Activity Tolerance  Patient tolerated treatment well    Behavior During Therapy  [facility] for tasks assessed/performed       Past Medical History:  Diagnosis Date  . Allergy   . Anal fissure   . Arthritis    shoulder, knees  . Asthma   . Atrophic gastritis without mention of hemorrhage   . Esophageal reflux   . Family history of gastric cancer   . Fibula fracture    hair line fracture  . Gastric polyps    history  . Hiatal hernia   . Hx of adenomatous colonic polyps   . Hyperlipidemia   . Metaplasia of esophagus 2011   "gastric metaplasia"  . Morbid obesity ([place])   . Obstructive sleep apnea on CPAP   . Other voice and resonance disorders   . Peripheral neuropathy   . Polyposis syndrome gastric and colonic - attenuated 12/09/2015  . Sleep apnea   . Squamous cell skin cancer 04/2018   lesion left forearm  . Unspecified asthma(493.90)     Past Surgical History:  Procedure Laterality Date  . APPENDECTOMY    . CHOLECYSTECTOMY    . COLONOSCOPY  08/20/2009 (multiple)   Tubular adenoma  . ESOPHAGOGASTRODUODENOSCOPY  08/20/09 (multiple)  . INCISION AND DRAINAGE PERIRECTAL ABSCESS  2007  . KNEE SURGERY     arthroscopy- both knees  . POLYPECTOMY    . UPPER GASTROINTESTINAL ENDOSCOPY      There were no vitals filed for this visit.  Subjective Assessment - 02/13/19 1511    Subjective  Pain in the feet is probably neuropathy, more in right leg.  Mostly in the AM and PM. I think balance is improving.    Patient Stated Goals  Walk better, Be able to walk more than 2 laps around the "[facility] [employer]" inside tract    Currently in Pain?  No/denies                       [facility] Adult PT Treatment/Exercise - 02/13/19 0001      Knee/Hip Exercises: Aerobic   Nustep  L5 6 min Le only      Knee/Hip Exercises: Standing   Wall Squat  5 sets   10s holds   Other Standing Knee Exercises  side stepping in // bars    Other Standing Knee Exercises  wide tandem balance with weight shift; box stepping               PT Short Term Goals - 02/13/19 1536      PT SHORT TERM GOAL #1   Title  Patient indepdent with his initial HEP.    Status  Achieved        PT Long Term Goals - 01/21/19 1353      PT LONG TERM GOAL #1   Title  Patient verbalizes / demonstrates  understanding of ongoing HEP / fitness plan. (01/21/2019)    Time  6    Period  Weeks    Status  New      PT LONG TERM GOAL #2   Title  Patient ambulates with AFOs & 1000 feet modified independent.    Baseline  amb short distances with bilateral AFO's 01/21/2019    Time  6    Period  Weeks    Status  New    [place] Date  03/04/19      PT LONG TERM GOAL #3   Title  Patient negotiates ramps, curbs & stairs with  AFOs  independent.  (01/21/2019)    Baseline  Pt reporting difficulty with stairs  and ramps    Time  6    Period  Weeks    Status  New      PT LONG TERM GOAL #4   Title  Berg Balance >35/56 to indicate lower fall risk.  (01/21/2019)    Baseline  Initial BERG at eval 19/56 on 01/21/2019    Time  6    Period  Weeks      PT LONG TERM GOAL #5   Title  Pt will improve his FOTO score from 54% limitation to </= 43% limitation.    Baseline  54% limitation on 01/21/2019    Time  6    Period  Weeks    Status  New            Plan - 02/13/19 1549    Clinical Impression Statement  Added wall squats with 10s holds which were challenging to patient. Improved  use of knee strategy in static wide tandem stance so we added box steps- very poorly controlled and required use of parallel bars.    PT Treatment/Interventions  ADLs/Self Care Home Management;Gait training;Stair training;Functional mobility training;Therapeutic activities;Therapeutic exercise;Balance training;Neuromuscular re-education;Patient/family education;Manual techniques;Passive range of motion;Energy conservation    PT Next Visit Plan  measure objective. box steps,    PT Home Exercise Plan  sit to stand instructions, continued pool exercises ( marching, hip abd, calf raises); hip isometrics; 2x62min bike, wall sits    Consulted and Agree with Plan of Care  Patient       Patient will benefit from skilled therapeutic intervention in order to improve the following deficits and impairments:  Difficulty walking, Decreased balance, Decreased mobility, Obesity, Pain, Decreased activity tolerance  Visit Diagnosis: 1. Muscle weakness (generalized)   2. Other abnormalities of gait and mobility   3. Difficulty in walking, not elsewhere classified        Problem List Patient Active Problem List   Diagnosis Date Noted  . Bilateral swelling of feet and ankles 12/18/2018  . Acute right ankle pain 09/18/2018  . Foot drop, bilateral 01/25/2018  . Morbid obesity with body mass index of 40.0-44.9 in adult ([place]) 12/04/2017  . Genetic testing 03/03/2016  . Polyposis syndrome gastric and colonic - attenuated 12/09/2015  . Pre-operative clearance 11/13/2015  . Essential hypertension 11/13/2015  . NS (nuclear sclerosis) 03/11/2015  . Degenerative arthritis of lumbar spine 09/03/2014  . Lumbar and sacral osteoarthritis 09/03/2014  . Ocular rosacea 08/19/2014  . Incomplete rotator cuff tear 07/22/2014  . Conjunctival chalasis 02/12/2014  . Dry eye syndrome 02/12/2014  . Dry eye 02/12/2014  . 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 Disease of eyelid 02/12/2014  . SPL (spondylolisthesis) 11/27/2013   . Family history of gastric cancer 05/16/2013  . Family history of cancer of digestive  organ 05/16/2013  . Imbalance 06/14/2012  . Peripheral neuropathic pain 06/14/2012  . Neuropathy 06/14/2012  . Glaucoma suspect 11/03/2011  . Cataract, nuclear 11/03/2011  . Posterior vitreous detachment 11/03/2011  . Hole, retinal 11/03/2011  . Ache in joint 05/26/2011  . Gastro-esophageal reflux disease without esophagitis 05/26/2011  . Acid reflux 05/26/2011  . Hypercholesterolemia 05/26/2011  . Hyperlipidemia 05/27/2009  . Morbid obesity ([place]) 05/27/2009  . GERD 09/11/2008  . History of colonic polyps 09/11/2008  . Multiple gastric polyps 09/11/2008  . H/O disease 09/11/2008  . HOARSENESS 09/10/2008  . SLEEP APNEA 11/05/2007  . ASTHMA 10/12/2007  . Airway hyperreactivity 10/12/2007  . GASTRITIS, CHRONIC 08/01/2006  . AG (atrophic gastritis) 08/01/2006   Cherye Gaertner C. Serigne Kubicek PT, DPT 02/13/19 4:01 PM   [facility] [address] [place], [place], [address] Phone: [phone]   Fax:  [phone]  Name: Albert Barr MRN: [id number] Date of Birth: Mar 02, 1941

## 2019-02-19 ENCOUNTER — Ambulatory Visit: Payer: Medicare Other | Admitting: Physical Therapy

## 2019-02-19 ENCOUNTER — Other Ambulatory Visit: Payer: Self-pay

## 2019-02-19 ENCOUNTER — Encounter: Payer: Self-pay | Admitting: Physical Therapy

## 2019-02-19 DIAGNOSIS — M6281 Muscle weakness (generalized): Secondary | ICD-10-CM

## 2019-02-19 DIAGNOSIS — R262 Difficulty in walking, not elsewhere classified: Secondary | ICD-10-CM

## 2019-02-19 DIAGNOSIS — R2689 Other abnormalities of gait and mobility: Secondary | ICD-10-CM

## 2019-02-19 NOTE — Therapy (Signed)
[place], [place], [address] Phone: [phone]   Fax:  [phone]  Physical Therapy Treatment  Patient Details  Name: Albert Barr MRN: [id number] Date of Birth: 09-13-1940 Referring Provider (PT): Stefanie Libel, MD   Encounter Date: 02/19/2019  PT End of Session - 02/19/19 1508    Visit Number  6    Number of Visits  12    Authorization Type  UHC/Medicare/Tricare    PT Start Time  [id number]    PT Stop Time  1543    PT Time Calculation (min)  37 min    Activity Tolerance  Patient tolerated treatment well    Behavior During Therapy  [facility] for tasks assessed/performed       Past Medical History:  Diagnosis Date  . Allergy   . Anal fissure   . Arthritis    shoulder, knees  . Asthma   . Atrophic gastritis without mention of hemorrhage   . Esophageal reflux   . Family history of gastric cancer   . Fibula fracture    hair line fracture  . Gastric polyps    history  . Hiatal hernia   . Hx of adenomatous colonic polyps   . Hyperlipidemia   . Metaplasia of esophagus 2011   "gastric metaplasia"  . Morbid obesity ([place])   . Obstructive sleep apnea on CPAP   . Other voice and resonance disorders   . Peripheral neuropathy   . Polyposis syndrome gastric and colonic - attenuated 12/09/2015  . Sleep apnea   . Squamous cell skin cancer 04/2018   lesion left forearm  . Unspecified asthma(493.90)     Past Surgical History:  Procedure Laterality Date  . APPENDECTOMY    . CHOLECYSTECTOMY    . COLONOSCOPY  08/20/2009 (multiple)   Tubular adenoma  . ESOPHAGOGASTRODUODENOSCOPY  08/20/09 (multiple)  . INCISION AND DRAINAGE PERIRECTAL ABSCESS  2007  . KNEE SURGERY     arthroscopy- both knees  . POLYPECTOMY    . UPPER GASTROINTESTINAL ENDOSCOPY      There were no vitals filed for this visit.      [facility] PT Assessment - 02/19/19 0001      Berg Balance Test   Sit to Stand  Able to stand  independently  using hands    Standing Unsupported  Able to stand 2 minutes with supervision    Sitting with Back Unsupported but Feet Supported on Floor or Stool  Able to sit safely and securely 2 minutes    Stand to Sit  Sits safely with minimal use of hands    Transfers  Able to transfer safely, definite need of hands    Standing Unsupported with Eyes Closed  Able to stand 10 seconds with supervision    Standing Unsupported with Feet Together  Needs help to attain position but able to stand for 30 seconds with feet together    From Standing, Reach Forward with Outstretched Arm  Reaches forward but needs supervision    From Standing Position, Pick up Object from Floor  Unable to try/needs assist to keep balance    From Standing Position, Turn to Look Behind Over each Shoulder  Needs supervision when turning    Turn 360 Degrees  Needs close supervision or verbal cueing    Standing Unsupported, Alternately Place Feet on Step/Stool  Needs assistance to keep from falling or unable to try    Standing Unsupported, One Foot in [employer] balance while  stepping or standing    Standing on One Leg  Unable to try or needs assist to prevent fall    Total Score  24                   [facility] Adult PT Treatment/Exercise - 02/19/19 0001      Knee/Hip Exercises: Aerobic   Nustep  L5 7 min LE only      Knee/Hip Exercises: Standing   Forward Step Up Limitations  step taps 4"    Other Standing Knee Exercises  side step & return, fwd step & return    Other Standing Knee Exercises  fall back with step             PT Education - 02/19/19 1547    Education Details  time taken to perform BERG balance test    Person(s) Educated  Patient    Methods  Explanation;Demonstration;Tactile cues;Verbal cues    Comprehension  Verbalized understanding;Returned demonstration;Verbal cues required;Tactile cues required;Need further instruction       PT Short Term Goals - 02/13/19 1536      PT SHORT TERM GOAL #1    Title  Patient indepdent with his initial HEP.    Status  Achieved        PT Long Term Goals - 02/19/19 1509      PT LONG TERM GOAL #1   Title  Patient verbalizes / demonstrates understanding of ongoing HEP / fitness plan. (01/21/2019)    Status  On-going      PT LONG TERM GOAL #2   Title  Patient ambulates with AFOs & 1000 feet modified independent.    Baseline  was walking about 1/5th of a mile with restx3= total 1/2 mile prior to breaking ankle; I walk around the pool at my [place] #3   Title  Patient negotiates ramps, curbs & stairs with  AFOs  independent.  (01/21/2019)    Baseline  havent noticed any dramatic differences, I think I am a little stronger; down hill is harder    Status  On-going      PT LONG TERM GOAL #4   Title  Berg Balance >35/56 to indicate lower fall risk.  (01/21/2019)    Baseline  24/56    Status  On-going      PT LONG TERM GOAL #5   Title  Pt will improve his FOTO score from 54% limitation to </= 43% limitation.    Baseline  54% limitation on 01/21/2019    Status  On-going            Plan - 02/19/19 1631    Clinical Impression Statement  Improvement of 5 points on BERG noted today. Continued to use parallel bars for balance challenges, noted improvement in using knee strategy to correct LOB.    PT Treatment/Interventions  ADLs/Self Care Home Management;Gait training;Stair training;Functional mobility training;Therapeutic activities;Therapeutic exercise;Balance training;Neuromuscular re-education;Patient/family education;Manual techniques;Passive range of motion;Energy conservation    PT Next Visit Plan  continue balance challenges, remove shoes/AFOs for ankle challenges    PT Home Exercise Plan  sit to stand instructions, continued pool exercises ( marching, hip abd, calf raises); hip isometrics; 2x21min bike, wall sits    Consulted and Agree with Plan of Care  Patient       Patient will benefit  from skilled therapeutic intervention in order to improve the following deficits and impairments:  Difficulty walking, Decreased balance, Decreased mobility, Obesity, Pain, Decreased activity tolerance  Visit Diagnosis: Muscle weakness (generalized)  Other abnormalities of gait and mobility  Difficulty in walking, not elsewhere classified     Problem List Patient Active Problem List   Diagnosis Date Noted  . Bilateral swelling of feet and ankles 12/18/2018  . Acute right ankle pain 09/18/2018  . Foot drop, bilateral 01/25/2018  . Morbid obesity with body mass index of 40.0-44.9 in adult ([place]) 12/04/2017  . Genetic testing 03/03/2016  . Polyposis syndrome gastric and colonic - attenuated 12/09/2015  . Pre-operative clearance 11/13/2015  . Essential hypertension 11/13/2015  . NS (nuclear sclerosis) 03/11/2015  . Degenerative arthritis of lumbar spine 09/03/2014  . Lumbar and sacral osteoarthritis 09/03/2014  . Ocular rosacea 08/19/2014  . Incomplete rotator cuff tear 07/22/2014  . Conjunctival chalasis 02/12/2014  . Dry eye syndrome 02/12/2014  . Dry eye 02/12/2014  . 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 Disease of eyelid 02/12/2014  . SPL (spondylolisthesis) 11/27/2013  . Family history of gastric cancer 05/16/2013  . Family history of cancer of digestive organ 05/16/2013  . Imbalance 06/14/2012  . Peripheral neuropathic pain 06/14/2012  . Neuropathy 06/14/2012  . Glaucoma suspect 11/03/2011  . Cataract, nuclear 11/03/2011  . Posterior vitreous detachment 11/03/2011  . Hole, retinal 11/03/2011  . Ache in joint 05/26/2011  . Gastro-esophageal reflux disease without esophagitis 05/26/2011  . Acid reflux 05/26/2011  . Hypercholesterolemia 05/26/2011  . Hyperlipidemia 05/27/2009  . Morbid obesity ([place]) 05/27/2009  . GERD 09/11/2008  . History of colonic polyps 09/11/2008  . Multiple gastric polyps 09/11/2008  . H/O disease 09/11/2008  . HOARSENESS 09/10/2008  . SLEEP  APNEA 11/05/2007  . ASTHMA 10/12/2007  . Airway hyperreactivity 10/12/2007  . GASTRITIS, CHRONIC 08/01/2006  . AG (atrophic gastritis) 08/01/2006   Fue Cervenka C. Bastien Strawser PT, DPT 02/19/19 4:36 PM   [facility] [address] [place], [place], [address] Phone: [phone]   Fax:  [phone]  Name: TALEB CARMICAL MRN: [id number] Date of Birth: 1941-03-07

## 2019-02-20 ENCOUNTER — Ambulatory Visit: Payer: Medicare Other | Admitting: Physical Therapy

## 2019-02-20 ENCOUNTER — Encounter: Payer: Self-pay | Admitting: Physical Therapy

## 2019-02-20 DIAGNOSIS — R262 Difficulty in walking, not elsewhere classified: Secondary | ICD-10-CM | POA: Diagnosis not present

## 2019-02-20 DIAGNOSIS — M6281 Muscle weakness (generalized): Secondary | ICD-10-CM

## 2019-02-20 DIAGNOSIS — R2689 Other abnormalities of gait and mobility: Secondary | ICD-10-CM

## 2019-02-20 NOTE — Therapy (Signed)
[place], [place], [address] Phone: [phone]   Fax:  [phone]  Physical Therapy Treatment  Patient Details  Name: Albert Barr MRN: [id number] Date of Birth: [birth year] Referring Provider (PT): Stefanie Libel, MD   Encounter Date: 02/20/2019  PT End of Session - 02/20/19 1510    Visit Number  7    Number of Visits  12    Authorization Type  UHC/Medicare/Tricare    PT Start Time  1500    PT Stop Time  1539    PT Time Calculation (min)  39 min    Activity Tolerance  Patient tolerated treatment well    Behavior During Therapy  [facility] for tasks assessed/performed       Past Medical History:  Diagnosis Date  . Allergy   . Anal fissure   . Arthritis    shoulder, knees  . Asthma   . Atrophic gastritis without mention of hemorrhage   . Esophageal reflux   . Family history of gastric cancer   . Fibula fracture    hair line fracture  . Gastric polyps    history  . Hiatal hernia   . Hx of adenomatous colonic polyps   . Hyperlipidemia   . Metaplasia of esophagus 2011   "gastric metaplasia"  . Morbid obesity ([place])   . Obstructive sleep apnea on CPAP   . Other voice and resonance disorders   . Peripheral neuropathy   . Polyposis syndrome gastric and colonic - attenuated 12/09/2015  . Sleep apnea   . Squamous cell skin cancer 04/2018   lesion left forearm  . Unspecified asthma(493.90)     Past Surgical History:  Procedure Laterality Date  . APPENDECTOMY    . CHOLECYSTECTOMY    . COLONOSCOPY  08/20/2009 (multiple)   Tubular adenoma  . ESOPHAGOGASTRODUODENOSCOPY  08/20/09 (multiple)  . INCISION AND DRAINAGE PERIRECTAL ABSCESS  2007  . KNEE SURGERY     arthroscopy- both knees  . POLYPECTOMY    . UPPER GASTROINTESTINAL ENDOSCOPY      There were no vitals filed for this visit.  Subjective Assessment - 02/20/19 1508    Subjective  no notable pain    Currently in Pain?  No/denies                        [facility] Adult PT Treatment/Exercise - 02/20/19 0001      Knee/Hip Exercises: Stretches   Passive Hamstring Stretch  Both;30 seconds;2 reps    Passive Hamstring Stretch Limitations  seated EOB    Gastroc Stretch  Both;30 seconds    Gastroc Stretch Limitations  slant board      Knee/Hip Exercises: Aerobic   Nustep  L6 7 min LE only      Knee/Hip Exercises: Standing   Forward Step Up Limitations  step taps 6"    Functional Squat Limitations  mini squats from parallel bars    SLS  opposite foot circles on orange plyoball    Other Standing Knee Exercises  parallel bars: tandem hold, NBOS with pelvic rotations, NBOS with diagonal arm reaches, comfortable stance with head turns      Knee/Hip Exercises: Seated   Sit to Sand  without UE support   cues to use a staggered stance              PT Short Term Goals - 02/13/19 1536      PT SHORT TERM GOAL #1  Title  Patient indepdent with his initial HEP.    Status  Achieved        PT Long Term Goals - 02/19/19 1509      PT LONG TERM GOAL #1   Title  Patient verbalizes / demonstrates understanding of ongoing HEP / fitness plan. (01/21/2019)    Status  On-going      PT LONG TERM GOAL #2   Title  Patient ambulates with AFOs & 1000 feet modified independent.    Baseline  was walking about 1/5th of a mile with restx3= total 1/2 mile prior to breaking ankle; I walk around the pool at my [place] #3   Title  Patient negotiates ramps, curbs & stairs with  AFOs  independent.  (01/21/2019)    Baseline  havent noticed any dramatic differences, I think I am a little stronger; down hill is harder    Status  On-going      PT LONG TERM GOAL #4   Title  Berg Balance >35/56 to indicate lower fall risk.  (01/21/2019)    Baseline  24/56    Status  On-going      PT LONG TERM GOAL #5   Title  Pt will improve his FOTO score from 54% limitation to </= 43%  limitation.    Baseline  54% limitation on 01/21/2019    Status  On-going            Plan - 02/20/19 1539    Clinical Impression Statement  Improving confidence in balance ablity. Heavy emphasis on cues to use stepping to catch LOB rather than grabbing bars first. Tends toward a narrow BOS and encouraged him to be aware of feet flat on the ground, esp in sit>stand.    PT Treatment/Interventions  ADLs/Self Care Home Management;Gait training;Stair training;Functional mobility training;Therapeutic activities;Therapeutic exercise;Balance training;Neuromuscular re-education;Patient/family education;Manual techniques;Passive range of motion;Energy conservation    PT Next Visit Plan  continue balance challenges, remove shoes/AFOs for ankle challenges    PT Home Exercise Plan  sit to stand instructions, continued pool exercises ( marching, hip abd, calf raises); hip isometrics; 2x31min bike, wall sits, step taps in pool, sit>stand staggered stance without UE    Consulted and Agree with Plan of Care  Patient       Patient will benefit from skilled therapeutic intervention in order to improve the following deficits and impairments:  Difficulty walking, Decreased balance, Decreased mobility, Obesity, Pain, Decreased activity tolerance  Visit Diagnosis: Muscle weakness (generalized)  Other abnormalities of gait and mobility  Difficulty in walking, not elsewhere classified     Problem List Patient Active Problem List   Diagnosis Date Noted  . Bilateral swelling of feet and ankles 12/18/2018  . Acute right ankle pain 09/18/2018  . Foot drop, bilateral 01/25/2018  . Morbid obesity with body mass index of 40.0-44.9 in adult ([place]) 12/04/2017  . Genetic testing 03/03/2016  . Polyposis syndrome gastric and colonic - attenuated 12/09/2015  . Pre-operative clearance 11/13/2015  . Essential hypertension 11/13/2015  . NS (nuclear sclerosis) 03/11/2015  . Degenerative arthritis of lumbar spine  09/03/2014  . Lumbar and sacral osteoarthritis 09/03/2014  . Ocular rosacea 08/19/2014  . Incomplete rotator cuff tear 07/22/2014  . Conjunctival chalasis 02/12/2014  . Dry eye syndrome 02/12/2014  . Dry eye 02/12/2014  . 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 Disease of eyelid 02/12/2014  . SPL (spondylolisthesis) 11/27/2013  .  Family history of gastric cancer 05/16/2013  . Family history of cancer of digestive organ 05/16/2013  . Imbalance 06/14/2012  . Peripheral neuropathic pain 06/14/2012  . Neuropathy 06/14/2012  . Glaucoma suspect 11/03/2011  . Cataract, nuclear 11/03/2011  . Posterior vitreous detachment 11/03/2011  . Hole, retinal 11/03/2011  . Ache in joint 05/26/2011  . Gastro-esophageal reflux disease without esophagitis 05/26/2011  . Acid reflux 05/26/2011  . Hypercholesterolemia 05/26/2011  . Hyperlipidemia 05/27/2009  . Morbid obesity ([place]) 05/27/2009  . GERD 09/11/2008  . History of colonic polyps 09/11/2008  . Multiple gastric polyps 09/11/2008  . H/O disease 09/11/2008  . HOARSENESS 09/10/2008  . SLEEP APNEA 11/05/2007  . ASTHMA 10/12/2007  . Airway hyperreactivity 10/12/2007  . GASTRITIS, CHRONIC 08/01/2006  . AG (atrophic gastritis) 08/01/2006    Neilson Oehlert C. Anevay Campanella PT, DPT 02/20/19 3:41 PM   [facility] [address] [place], [place], [address] Phone: [phone]   Fax:  [phone]  Name: Albert Barr MRN: [id number] Date of Birth: Jan 26, 1941

## 2019-02-25 ENCOUNTER — Ambulatory Visit: Payer: Medicare Other

## 2019-02-25 ENCOUNTER — Other Ambulatory Visit: Payer: Self-pay

## 2019-02-25 DIAGNOSIS — R262 Difficulty in walking, not elsewhere classified: Secondary | ICD-10-CM

## 2019-02-25 DIAGNOSIS — R2689 Other abnormalities of gait and mobility: Secondary | ICD-10-CM

## 2019-02-25 DIAGNOSIS — M6281 Muscle weakness (generalized): Secondary | ICD-10-CM

## 2019-02-25 NOTE — Therapy (Signed)
[place], [place], [address] Phone: [phone]   Fax:  [phone]  Physical Therapy Treatment  Patient Details  Name: Albert Barr MRN: [id number] Date of Birth: 04/16/1941 Referring Provider (PT): Stefanie Libel, MD   Encounter Date: 02/25/2019  PT End of Session - 02/25/19 1453    Visit Number  8    Number of Visits  12    Authorization Type  UHC/Medicare/Tricare    PT Start Time  0253    PT Stop Time  0331    PT Time Calculation (min)  38 min    Activity Tolerance  Patient tolerated treatment well    Behavior During Therapy  [facility] for tasks assessed/performed       Past Medical History:  Diagnosis Date  . Allergy   . Anal fissure   . Arthritis    shoulder, knees  . Asthma   . Atrophic gastritis without mention of hemorrhage   . Esophageal reflux   . Family history of gastric cancer   . Fibula fracture    hair line fracture  . Gastric polyps    history  . Hiatal hernia   . Hx of adenomatous colonic polyps   . Hyperlipidemia   . Metaplasia of esophagus 2011   "gastric metaplasia"  . Morbid obesity ([place])   . Obstructive sleep apnea on CPAP   . Other voice and resonance disorders   . Peripheral neuropathy   . Polyposis syndrome gastric and colonic - attenuated 12/09/2015  . Sleep apnea   . Squamous cell skin cancer 04/2018   lesion left forearm  . Unspecified asthma(493.90)     Past Surgical History:  Procedure Laterality Date  . APPENDECTOMY    . CHOLECYSTECTOMY    . COLONOSCOPY  08/20/2009 (multiple)   Tubular adenoma  . ESOPHAGOGASTRODUODENOSCOPY  08/20/09 (multiple)  . INCISION AND DRAINAGE PERIRECTAL ABSCESS  2007  . KNEE SURGERY     arthroscopy- both knees  . POLYPECTOMY    . UPPER GASTROINTESTINAL ENDOSCOPY      There were no vitals filed for this visit.  Subjective Assessment - 02/25/19 1457    Subjective  He reports feeling more confident and able to get out of a  chair easier.    Currently in Pain?  No/denies                       [facility] Adult PT Treatment/Exercise - 02/25/19 0001      Neuro Re-ed    Neuro Re-ed Details   standing static balance with PT guarding SBA-MaxA; weight shifting all directions and in step stnading. Walkking with each foot touching red line in bars with touch to rails and steppin across each foot  for weight shift and narrow BOS      Knee/Hip Exercises: Aerobic   Nustep  L6 6 min  LE      Knee/Hip Exercises: Standing   Forward Step Up Limitations  step taps 8 inch step forward and back x 10-15 reps  with 2  and 1 hand hold to bars.   then step ups to 8 inch step x 10 RT/Lt   2 hand hold at  cybex hip machine    Other Standing Knee Exercises  in bars foot on wash cloth   x 10  reps 2 sets abduction and extension nad with circles RT/Lt  with light touch to bars.       Knee/Hip Exercises:  Seated   Sit to Sand  without UE support;15 reps   used staggerdd stance Lt foot in front.               PT Short Term Goals - 02/13/19 1536      PT SHORT TERM GOAL #1   Title  Patient indepdent with his initial HEP.    Status  Achieved        PT Long Term Goals - 02/19/19 1509      PT LONG TERM GOAL #1   Title  Patient verbalizes / demonstrates understanding of ongoing HEP / fitness plan. (01/21/2019)    Status  On-going      PT LONG TERM GOAL #2   Title  Patient ambulates with AFOs & 1000 feet modified independent.    Baseline  was walking about 1/5th of a mile with restx3= total 1/2 mile prior to breaking ankle; I walk around the pool at my [place] #3   Title  Patient negotiates ramps, curbs & stairs with  AFOs  independent.  (01/21/2019)    Baseline  havent noticed any dramatic differences, I think I am a little stronger; down hill is harder    Status  On-going      PT LONG TERM GOAL #4   Title  Berg Balance >35/56 to indicate lower fall risk.   (01/21/2019)    Baseline  24/56    Status  On-going      PT LONG TERM GOAL #5   Title  Pt will improve his FOTO score from 54% limitation to </= 43% limitation.    Baseline  54% limitation on 01/21/2019    Status  On-going            Plan - 02/25/19 1454    Clinical Impression Statement  He felt he worked hard and was somewhat SOB but sat inn lobby until felt better.  Will continue to sork his balance and strength    PT Treatment/Interventions  ADLs/Self Care Home Management;Gait training;Stair training;Functional mobility training;Therapeutic activities;Therapeutic exercise;Balance training;Neuromuscular re-education;Patient/family education;Manual techniques;Passive range of motion;Energy conservation    PT Next Visit Plan  continue balance challenges, remove shoes/AFOs for ankle challenges    PT Home Exercise Plan  sit to stand instructions, continued pool exercises ( marching, hip abd, calf raises); hip isometrics; 2x21min bike, wall sits, step taps in pool, sit>stand staggered stance without UE    Consulted and Agree with Plan of Care  Patient       Patient will benefit from skilled therapeutic intervention in order to improve the following deficits and impairments:  Difficulty walking, Decreased balance, Decreased mobility, Obesity, Pain, Decreased activity tolerance  Visit Diagnosis: Muscle weakness (generalized)  Other abnormalities of gait and mobility  Difficulty in walking, not elsewhere classified     Problem List Patient Active Problem List   Diagnosis Date Noted  . Bilateral swelling of feet and ankles 12/18/2018  . Acute right ankle pain 09/18/2018  . Foot drop, bilateral 01/25/2018  . Morbid obesity with body mass index of 40.0-44.9 in adult ([place]) 12/04/2017  . Genetic testing 03/03/2016  . Polyposis syndrome gastric and colonic - attenuated 12/09/2015  . Pre-operative clearance 11/13/2015  . Essential hypertension 11/13/2015  . NS (nuclear sclerosis)  03/11/2015  . Degenerative arthritis of lumbar spine 09/03/2014  . Lumbar and sacral osteoarthritis 09/03/2014  . Ocular rosacea 08/19/2014  . Incomplete  rotator cuff tear 07/22/2014  . Conjunctival chalasis 02/12/2014  . Dry eye syndrome 02/12/2014  . Dry eye 02/12/2014  . 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 Disease of eyelid 02/12/2014  . SPL (spondylolisthesis) 11/27/2013  . Family history of gastric cancer 05/16/2013  . Family history of cancer of digestive organ 05/16/2013  . Imbalance 06/14/2012  . Peripheral neuropathic pain 06/14/2012  . Neuropathy 06/14/2012  . Glaucoma suspect 11/03/2011  . Cataract, nuclear 11/03/2011  . Posterior vitreous detachment 11/03/2011  . Hole, retinal 11/03/2011  . Ache in joint 05/26/2011  . Gastro-esophageal reflux disease without esophagitis 05/26/2011  . Acid reflux 05/26/2011  . Hypercholesterolemia 05/26/2011  . Hyperlipidemia 05/27/2009  . Morbid obesity ([place]) 05/27/2009  . GERD 09/11/2008  . History of colonic polyps 09/11/2008  . Multiple gastric polyps 09/11/2008  . H/O disease 09/11/2008  . HOARSENESS 09/10/2008  . SLEEP APNEA 11/05/2007  . ASTHMA 10/12/2007  . Airway hyperreactivity 10/12/2007  . GASTRITIS, CHRONIC 08/01/2006  . AG (atrophic gastritis) 08/01/2006    Darrel Hoover PT 02/25/2019, 3:31 PM  [facility] [address] [place], [place], [address] Phone: [phone]   Fax:  [phone]  Name: Albert Barr MRN: [id number] Date of Birth: [birth year]

## 2019-02-28 ENCOUNTER — Other Ambulatory Visit: Payer: Self-pay

## 2019-02-28 ENCOUNTER — Ambulatory Visit: Payer: Medicare Other | Attending: Sports Medicine

## 2019-02-28 DIAGNOSIS — R2689 Other abnormalities of gait and mobility: Secondary | ICD-10-CM

## 2019-02-28 DIAGNOSIS — M6281 Muscle weakness (generalized): Secondary | ICD-10-CM

## 2019-02-28 DIAGNOSIS — R262 Difficulty in walking, not elsewhere classified: Secondary | ICD-10-CM | POA: Diagnosis present

## 2019-02-28 NOTE — Therapy (Signed)
[place], [place], [address] Phone: [phone]   Fax:  [phone]  Physical Therapy Treatment  Patient Details  Name: Albert Barr MRN: [id number] Date of Birth: 06-Jul-1940 Referring Provider (PT): Stefanie Libel, MD   Encounter Date: 02/28/2019  PT End of Session - 02/28/19 1000    Visit Number  9    Number of Visits  12    Authorization Type  UHC/Medicare/Tricare    PT Start Time  1000    PT Stop Time  0140    PT Time Calculation (min)  940 min    Activity Tolerance  Patient tolerated treatment well    Behavior During Therapy  [facility] for tasks assessed/performed       Past Medical History:  Diagnosis Date  . Allergy   . Anal fissure   . Arthritis    shoulder, knees  . Asthma   . Atrophic gastritis without mention of hemorrhage   . Esophageal reflux   . Family history of gastric cancer   . Fibula fracture    hair line fracture  . Gastric polyps    history  . Hiatal hernia   . Hx of adenomatous colonic polyps   . Hyperlipidemia   . Metaplasia of esophagus 2011   "gastric metaplasia"  . Morbid obesity ([place])   . Obstructive sleep apnea on CPAP   . Other voice and resonance disorders   . Peripheral neuropathy   . Polyposis syndrome gastric and colonic - attenuated 12/09/2015  . Sleep apnea   . Squamous cell skin cancer 04/2018   lesion left forearm  . Unspecified asthma(493.90)     Past Surgical History:  Procedure Laterality Date  . APPENDECTOMY    . CHOLECYSTECTOMY    . COLONOSCOPY  08/20/2009 (multiple)   Tubular adenoma  . ESOPHAGOGASTRODUODENOSCOPY  08/20/09 (multiple)  . INCISION AND DRAINAGE PERIRECTAL ABSCESS  2007  . KNEE SURGERY     arthroscopy- both knees  . POLYPECTOMY    . UPPER GASTROINTESTINAL ENDOSCOPY      There were no vitals filed for this visit.  Subjective Assessment - 02/28/19 1054    Subjective  Doing well . Try to shave and do teeth in AM without UE  support and can tell they ar e working with this. He reports feeling better with more confidence.                       [place] Adult PT Treatment/Exercise - 02/28/19 0001      Neuro Re-ed    Neuro Re-ed Details   stand with light touch to heel and lateral step very short distance no UE support guarded by PT.        Knee/Hip Exercises: Aerobic   Nustep  L6 6 min  LE      Knee/Hip Exercises: Standing   Hip Abduction  Right;Left;Knee straight;2 sets;10 reps    Abduction Limitations  red band    Forward Step Up Limitations  step taps 8 inch step forward and back x -15 reps  with no hand hold.   then step ups to 8 inch step x 10 RT/Lt   2    Functional Squat Limitations  Mini squats back to wall x 15      Knee/Hip Exercises: Seated   Sit to Sand  --   15 reps UE asssit to get out of chair guarded by PT  PT Education - 02/28/19 1047    Education Details  Time taken to discusse ankle strength and impact on balance  benefits of using safe challenge for improving balance    Person(s) Educated  Patient    Methods  Explanation    Comprehension  Verbalized understanding       PT Short Term Goals - 02/13/19 1536      PT SHORT TERM GOAL #1   Title  Patient indepdent with his initial HEP.    Status  Achieved        PT Long Term Goals - 02/19/19 1509      PT LONG TERM GOAL #1   Title  Patient verbalizes / demonstrates understanding of ongoing HEP / fitness plan. (01/21/2019)    Status  On-going      PT LONG TERM GOAL #2   Title  Patient ambulates with AFOs & 1000 feet modified independent.    Baseline  was walking about 1/5th of a mile with restx3= total 1/2 mile prior to breaking ankle; I walk around the pool at my [place] #3   Title  Patient negotiates ramps, curbs & stairs with  AFOs  independent.  (01/21/2019)    Baseline  havent noticed any dramatic differences, I think I am a little stronger; down  hill is harder    Status  On-going      PT LONG TERM GOAL #4   Title  Berg Balance >35/56 to indicate lower fall risk.  (01/21/2019)    Baseline  24/56    Status  On-going      PT LONG TERM GOAL #5   Title  Pt will improve his FOTO score from 54% limitation to </= 43% limitation.    Baseline  54% limitation on 01/21/2019    Status  On-going            Plan - 02/28/19 1048    Clinical Impression Statement  Tolerated all exercises. Balnce still de creased . Needs  extenison or dicharge next week.  Not likely he will improve significantly but may benefit from extension.    PT Treatment/Interventions  ADLs/Self Care Home Management;Gait training;Stair training;Functional mobility training;Therapeutic activities;Therapeutic exercise;Balance training;Neuromuscular re-education;Patient/family education;Manual techniques;Passive range of motion;Energy conservation    PT Next Visit Plan  continue balance challenges, remove shoes/AFOs for ankle challenges     Extend versus discharge at end of next week    PT Home Exercise Plan  sit to stand instructions, continued pool exercises ( marching, hip abd, calf raises); hip isometrics; 2x68min bike, wall sits, step taps in pool, sit>stand staggered stance without UE    Consulted and Agree with Plan of Care  Patient       Patient will benefit from skilled therapeutic intervention in order to improve the following deficits and impairments:  Difficulty walking, Decreased balance, Decreased mobility, Obesity, Pain, Decreased activity tolerance  Visit Diagnosis: Muscle weakness (generalized)  Other abnormalities of gait and mobility  Difficulty in walking, not elsewhere classified     Problem List Patient Active Problem List   Diagnosis Date Noted  . Bilateral swelling of feet and ankles 12/18/2018  . Acute right ankle pain 09/18/2018  . Foot drop, bilateral 01/25/2018  . Morbid obesity with body mass index of 40.0-44.9 in adult ([place])  12/04/2017  . Genetic testing 03/03/2016  . Polyposis syndrome gastric and colonic - attenuated 12/09/2015  . Pre-operative  clearance 11/13/2015  . Essential hypertension 11/13/2015  . NS (nuclear sclerosis) 03/11/2015  . Degenerative arthritis of lumbar spine 09/03/2014  . Lumbar and sacral osteoarthritis 09/03/2014  . Ocular rosacea 08/19/2014  . Incomplete rotator cuff tear 07/22/2014  . Conjunctival chalasis 02/12/2014  . Dry eye syndrome 02/12/2014  . Dry eye 02/12/2014  . 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 Disease of eyelid 02/12/2014  . SPL (spondylolisthesis) 11/27/2013  . Family history of gastric cancer 05/16/2013  . Family history of cancer of digestive organ 05/16/2013  . Imbalance 06/14/2012  . Peripheral neuropathic pain 06/14/2012  . Neuropathy 06/14/2012  . Glaucoma suspect 11/03/2011  . Cataract, nuclear 11/03/2011  . Posterior vitreous detachment 11/03/2011  . Hole, retinal 11/03/2011  . Ache in joint 05/26/2011  . Gastro-esophageal reflux disease without esophagitis 05/26/2011  . Acid reflux 05/26/2011  . Hypercholesterolemia 05/26/2011  . Hyperlipidemia 05/27/2009  . Morbid obesity ([place]) 05/27/2009  . GERD 09/11/2008  . History of colonic polyps 09/11/2008  . Multiple gastric polyps 09/11/2008  . H/O disease 09/11/2008  . HOARSENESS 09/10/2008  . SLEEP APNEA 11/05/2007  . ASTHMA 10/12/2007  . Airway hyperreactivity 10/12/2007  . GASTRITIS, CHRONIC 08/01/2006  . AG (atrophic gastritis) 08/01/2006    Albert Barr 02/28/2019, 10:59 AM  [facility] [address] [place], [place], [address] Phone: [phone]   Fax:  [phone]  Name: Albert Barr MRN: [id number] Date of Birth: 04/23/1941

## 2019-02-28 NOTE — Therapy (Signed)
[place], [place], [address] Phone: [phone]   Fax:  [phone]  Physical Therapy Treatment  Patient Details  Name: Albert Barr MRN: [id number] Date of Birth: April 20, 1941 Referring Provider (PT): Stefanie Libel, MD   Encounter Date: 02/28/2019  PT End of Session - 02/28/19 1000    Visit Number  9    Number of Visits  12    Authorization Type  UHC/Medicare/Tricare    PT Start Time  1000    PT Stop Time  0140    PT Time Calculation (min)  940 min    Activity Tolerance  Patient tolerated treatment well    Behavior During Therapy  [facility] for tasks assessed/performed       Past Medical History:  Diagnosis Date  . Allergy   . Anal fissure   . Arthritis    shoulder, knees  . Asthma   . Atrophic gastritis without mention of hemorrhage   . Esophageal reflux   . Family history of gastric cancer   . Fibula fracture    hair line fracture  . Gastric polyps    history  . Hiatal hernia   . Hx of adenomatous colonic polyps   . Hyperlipidemia   . Metaplasia of esophagus 2011   "gastric metaplasia"  . Morbid obesity ([place])   . Obstructive sleep apnea on CPAP   . Other voice and resonance disorders   . Peripheral neuropathy   . Polyposis syndrome gastric and colonic - attenuated 12/09/2015  . Sleep apnea   . Squamous cell skin cancer 04/2018   lesion left forearm  . Unspecified asthma(493.90)     Past Surgical History:  Procedure Laterality Date  . APPENDECTOMY    . CHOLECYSTECTOMY    . COLONOSCOPY  08/20/2009 (multiple)   Tubular adenoma  . ESOPHAGOGASTRODUODENOSCOPY  08/20/09 (multiple)  . INCISION AND DRAINAGE PERIRECTAL ABSCESS  2007  . KNEE SURGERY     arthroscopy- both knees  . POLYPECTOMY    . UPPER GASTROINTESTINAL ENDOSCOPY      There were no vitals filed for this visit.  Subjective Assessment - 02/28/19 1054    Subjective  Doing well . Try to shave and do teeth in AM without UE  support and can tell they ar e working with this. He reports feeling better with more confidence.                       [place] Adult PT Treatment/Exercise - 02/28/19 0001      Neuro Re-ed    Neuro Re-ed Details   stand with light touch to heel and lateral step very short distance no UE support guarded by PT.        Knee/Hip Exercises: Aerobic   Nustep  L6 6 min  LE      Knee/Hip Exercises: Standing   Hip Abduction  Right;Left;Knee straight;2 sets;10 reps    Abduction Limitations  red band    Forward Step Up Limitations  step taps 8 inch step forward and back x -15 reps  with no hand hold.   then step ups to 8 inch step x 10 RT/Lt   2    Functional Squat Limitations  Mini squats back to wall x 15      Knee/Hip Exercises: Seated   Sit to Sand  --   15 reps UE asssit to get out of chair guarded by PT  PT Education - 02/28/19 1047    Education Details  Time taken to discusse ankle strength and impact on balance  benefits of using safe challenge for improving balance    Person(s) Educated  Patient    Methods  Explanation    Comprehension  Verbalized understanding       PT Short Term Goals - 02/13/19 1536      PT SHORT TERM GOAL #1   Title  Patient indepdent with his initial HEP.    Status  Achieved        PT Long Term Goals - 02/19/19 1509      PT LONG TERM GOAL #1   Title  Patient verbalizes / demonstrates understanding of ongoing HEP / fitness plan. (01/21/2019)    Status  On-going      PT LONG TERM GOAL #2   Title  Patient ambulates with AFOs & 1000 feet modified independent.    Baseline  was walking about 1/5th of a mile with restx3= total 1/2 mile prior to breaking ankle; I walk around the pool at my [place] #3   Title  Patient negotiates ramps, curbs & stairs with  AFOs  independent.  (01/21/2019)    Baseline  havent noticed any dramatic differences, I think I am a little stronger; down  hill is harder    Status  On-going      PT LONG TERM GOAL #4   Title  Berg Balance >35/56 to indicate lower fall risk.  (01/21/2019)    Baseline  24/56    Status  On-going      PT LONG TERM GOAL #5   Title  Pt will improve his FOTO score from 54% limitation to </= 43% limitation.    Baseline  54% limitation on 01/21/2019    Status  On-going            Plan - 02/28/19 1048    Clinical Impression Statement  Tolerated all exercises. Balnce still de creased . Needs  extenison or dicharge next week.  Not likely he will improve significantly but may benefit from extension.    PT Treatment/Interventions  ADLs/Self Care Home Management;Gait training;Stair training;Functional mobility training;Therapeutic activities;Therapeutic exercise;Balance training;Neuromuscular re-education;Patient/family education;Manual techniques;Passive range of motion;Energy conservation    PT Next Visit Plan  continue balance challenges, remove shoes/AFOs for ankle challenges    PT Home Exercise Plan  sit to stand instructions, continued pool exercises ( marching, hip abd, calf raises); hip isometrics; 2x80min bike, wall sits, step taps in pool, sit>stand staggered stance without UE    Consulted and Agree with Plan of Care  Patient       Patient will benefit from skilled therapeutic intervention in order to improve the following deficits and impairments:  Difficulty walking, Decreased balance, Decreased mobility, Obesity, Pain, Decreased activity tolerance  Visit Diagnosis: Muscle weakness (generalized)  Other abnormalities of gait and mobility  Difficulty in walking, not elsewhere classified     Problem List Patient Active Problem List   Diagnosis Date Noted  . Bilateral swelling of feet and ankles 12/18/2018  . Acute right ankle pain 09/18/2018  . Foot drop, bilateral 01/25/2018  . Morbid obesity with body mass index of 40.0-44.9 in adult ([place]) 12/04/2017  . Genetic testing 03/03/2016  . Polyposis  syndrome gastric and colonic - attenuated 12/09/2015  . Pre-operative clearance 11/13/2015  . Essential hypertension 11/13/2015  . NS (nuclear sclerosis)  03/11/2015  . Degenerative arthritis of lumbar spine 09/03/2014  . Lumbar and sacral osteoarthritis 09/03/2014  . Ocular rosacea 08/19/2014  . Incomplete rotator cuff tear 07/22/2014  . Conjunctival chalasis 02/12/2014  . Dry eye syndrome 02/12/2014  . Dry eye 02/12/2014  . 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 Disease of eyelid 02/12/2014  . SPL (spondylolisthesis) 11/27/2013  . Family history of gastric cancer 05/16/2013  . Family history of cancer of digestive organ 05/16/2013  . Imbalance 06/14/2012  . Peripheral neuropathic pain 06/14/2012  . Neuropathy 06/14/2012  . Glaucoma suspect 11/03/2011  . Cataract, nuclear 11/03/2011  . Posterior vitreous detachment 11/03/2011  . Hole, retinal 11/03/2011  . Ache in joint 05/26/2011  . Gastro-esophageal reflux disease without esophagitis 05/26/2011  . Acid reflux 05/26/2011  . Hypercholesterolemia 05/26/2011  . Hyperlipidemia 05/27/2009  . Morbid obesity ([place]) 05/27/2009  . GERD 09/11/2008  . History of colonic polyps 09/11/2008  . Multiple gastric polyps 09/11/2008  . H/O disease 09/11/2008  . HOARSENESS 09/10/2008  . SLEEP APNEA 11/05/2007  . ASTHMA 10/12/2007  . Airway hyperreactivity 10/12/2007  . GASTRITIS, CHRONIC 08/01/2006  . AG (atrophic gastritis) 08/01/2006    Albert Barr  PT 02/28/2019, 10:55 AM  [facility] [address] [place], [place], [address] Phone: [phone]   Fax:  [phone]  Name: Albert Barr MRN: [id number] Date of Birth: [birth year]

## 2019-03-05 ENCOUNTER — Other Ambulatory Visit: Payer: Self-pay

## 2019-03-05 ENCOUNTER — Encounter: Payer: Self-pay | Admitting: Physical Therapy

## 2019-03-05 ENCOUNTER — Encounter: Payer: Medicare Other | Admitting: Physical Therapy

## 2019-03-05 ENCOUNTER — Ambulatory Visit: Payer: Medicare Other | Admitting: Physical Therapy

## 2019-03-05 DIAGNOSIS — M6281 Muscle weakness (generalized): Secondary | ICD-10-CM | POA: Diagnosis not present

## 2019-03-05 DIAGNOSIS — R2689 Other abnormalities of gait and mobility: Secondary | ICD-10-CM

## 2019-03-05 DIAGNOSIS — R262 Difficulty in walking, not elsewhere classified: Secondary | ICD-10-CM

## 2019-03-05 NOTE — Therapy (Signed)
[place], [place], [address] Phone: [phone]   Fax:  [phone]  Physical Therapy Treatment  Patient Details  Name: Albert Barr MRN: [id number] Date of Birth: June 14, 1941 Referring Provider (PT): Stefanie Libel, MD   Encounter Date: 03/05/2019  PT End of Session - 03/05/19 1506    Visit Number  10    Number of Visits  12    Authorization Type  UHC/Medicare/Tricare    PT Start Time  1500    PT Stop Time  1545    PT Time Calculation (min)  45 min    Activity Tolerance  Patient tolerated treatment well    Behavior During Therapy  [facility] for tasks assessed/performed       Past Medical History:  Diagnosis Date  . Allergy   . Anal fissure   . Arthritis    shoulder, knees  . Asthma   . Atrophic gastritis without mention of hemorrhage   . Esophageal reflux   . Family history of gastric cancer   . Fibula fracture    hair line fracture  . Gastric polyps    history  . Hiatal hernia   . Hx of adenomatous colonic polyps   . Hyperlipidemia   . Metaplasia of esophagus 2011   "gastric metaplasia"  . Morbid obesity ([place])   . Obstructive sleep apnea on CPAP   . Other voice and resonance disorders   . Peripheral neuropathy   . Polyposis syndrome gastric and colonic - attenuated 12/09/2015  . Sleep apnea   . Squamous cell skin cancer 04/2018   lesion left forearm  . Unspecified asthma(493.90)     Past Surgical History:  Procedure Laterality Date  . APPENDECTOMY    . CHOLECYSTECTOMY    . COLONOSCOPY  08/20/2009 (multiple)   Tubular adenoma  . ESOPHAGOGASTRODUODENOSCOPY  08/20/09 (multiple)  . INCISION AND DRAINAGE PERIRECTAL ABSCESS  2007  . KNEE SURGERY     arthroscopy- both knees  . POLYPECTOMY    . UPPER GASTROINTESTINAL ENDOSCOPY      There were no vitals filed for this visit.  Subjective Assessment - 03/05/19 1505    Subjective  Pt reporting his exercises are going well at home.    Pertinent History  peripheral neuropathy, squamous cell skin cancer 04/2018, morbid obesity, asthma, arthritis, cataracts,  esophageal reflux, colon polyps, HTN (per pt report)    Currently in Pain?  No/denies                       [facility] Adult PT Treatment/Exercise - 03/05/19 0001      Knee/Hip Exercises: Aerobic   Nustep  L6  5  min  LE      Knee/Hip Exercises: Standing   Heel Raises  Both;5 reps    Hip Flexion  Stengthening;Both;20 reps;Knee bent    Hip Abduction  Right;Left;Knee straight;2 sets;10 reps    Hip Extension  Stengthening;Both;15 reps    Forward Step Up  Both;15 reps;Hand Hold: 2    Other Standing Knee Exercises  side stepping 6 feet x 6 reps using UE intermittent support, hamstring curls x 20 reps with UE support    Other Standing Knee Exercises  sit to stand from low mat table using no UE support x 12 before pt had to take a sitting rest break      Knee/Hip Exercises: Seated   Long Arc Quad  Strengthening;10 reps    Long [employer] Limitations  holding 5 seconds      Knee/Hip Exercises: Supine   Bridges  Strengthening;Both;2 sets;10 reps;Limitations    Bridges Limitations  instructions in abdominal contraction and PPT prior to lift off    Straight Leg Raises  Strengthening;15 reps               PT Short Term Goals - 02/13/19 1536      PT SHORT TERM GOAL #1   Title  Patient indepdent with his initial HEP.    Status  Achieved        PT Long Term Goals - 03/05/19 1521      PT LONG TERM GOAL #1   Title  Patient verbalizes / demonstrates understanding of ongoing HEP / fitness plan. (01/21/2019)    Baseline  MEt 10/19/2015    Time  6    Period  Weeks    Status  Achieved      PT LONG TERM GOAL #2   Baseline  was walking about 1/4th of a mile with restx3= total 1/2 mile prior to breaking ankle; [place]    Time  6    Period  Weeks    Status  On-going      PT LONG TERM GOAL #3   Baseline  I still have to  go up and down off curb sideways.    Time  6    Period  Weeks    Status  On-going      PT LONG TERM GOAL #4   Title  Berg Balance >35/56 to indicate lower fall risk.  (01/21/2019)    Baseline  24/56    Time  6    Period  Weeks    Status  On-going      PT LONG TERM GOAL #5   Title  Pt will improve his FOTO score from 54% limitation to </= 43% limitation.    Baseline  54% limitation on 01/21/2019    Time  6    Period  Weeks    Status  On-going            Plan - 03/05/19 1507    Clinical Impression Statement  Pt tolerating exercises well. Pt still with balance deficits and LE weakness. Pt reported that his gym is opening back up and he feels he can maintain his HEP. Pt feels that he has made progress with confidence and overall strength. We discussed possible discharge at next session. Pt is still progressing toward  goals set at evaluation.    Comorbidities  R ankle fx 08/2018,  knee surgery, peripherial neuropathy, arthritis, asthma, morbid obesity, squamous cell skin CA 04/2018, esophgeal reflux, polyps    Examination-Activity Limitations  Transfers;Stairs;Stand    Examination-Participation Restrictions  Community Activity;Church    Stability/Clinical Decision Making  Stable/Uncomplicated    Rehab Potential  Good    PT Frequency  2x / week    PT Duration  6 weeks    PT Treatment/Interventions  ADLs/Self Care Home Management;Gait training;Stair training;Functional mobility training;Therapeutic activities;Therapeutic exercise;Balance training;Neuromuscular re-education;Patient/family education;Manual techniques;Passive range of motion;Energy conservation    PT Next Visit Plan  Discharge pt at next visit. continue balance challenges, remove shoes/AFOs for ankle challenges    PT Home Exercise Plan  sit to stand instructions, continued pool exercises ( marching, hip abd, calf raises); hip isometrics; 2x78mn bike, wall sits, step taps in pool, sit>stand staggered stance without UE     Consulted and Agree with  Plan of Care  Patient       Patient will benefit from skilled therapeutic intervention in order to improve the following deficits and impairments:  Difficulty walking, Decreased balance, Decreased mobility, Obesity, Pain, Decreased activity tolerance  Visit Diagnosis: Muscle weakness (generalized)  Other abnormalities of gait and mobility  Difficulty in walking, not elsewhere classified     Problem List Patient Active Problem List   Diagnosis Date Noted  . Bilateral swelling of feet and ankles 12/18/2018  . Acute right ankle pain 09/18/2018  . Foot drop, bilateral 01/25/2018  . Morbid obesity with body mass index of 40.0-44.9 in adult ([place]) 12/04/2017  . Genetic testing 03/03/2016  . Polyposis syndrome gastric and colonic - attenuated 12/09/2015  . Pre-operative clearance 11/13/2015  . Essential hypertension 11/13/2015  . NS (nuclear sclerosis) 03/11/2015  . Degenerative arthritis of lumbar spine 09/03/2014  . Lumbar and sacral osteoarthritis 09/03/2014  . Ocular rosacea 08/19/2014  . Incomplete rotator cuff tear 07/22/2014  . Conjunctival chalasis 02/12/2014  . Dry eye syndrome 02/12/2014  . Dry eye 02/12/2014  . 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 Disease of eyelid 02/12/2014  . SPL (spondylolisthesis) 11/27/2013  . Family history of gastric cancer 05/16/2013  . Family history of cancer of digestive organ 05/16/2013  . Imbalance 06/14/2012  . Peripheral neuropathic pain 06/14/2012  . Neuropathy 06/14/2012  . Glaucoma suspect 11/03/2011  . Cataract, nuclear 11/03/2011  . Posterior vitreous detachment 11/03/2011  . Hole, retinal 11/03/2011  . Ache in joint 05/26/2011  . Gastro-esophageal reflux disease without esophagitis 05/26/2011  . Acid reflux 05/26/2011  . Hypercholesterolemia 05/26/2011  . Hyperlipidemia 05/27/2009  . Morbid obesity ([place]) 05/27/2009  . GERD 09/11/2008  . History of colonic polyps 09/11/2008  . Multiple gastric  polyps 09/11/2008  . H/O disease 09/11/2008  . HOARSENESS 09/10/2008  . SLEEP APNEA 11/05/2007  . ASTHMA 10/12/2007  . Airway hyperreactivity 10/12/2007  . GASTRITIS, CHRONIC 08/01/2006  . AG (atrophic gastritis) 08/01/2006    Oretha Caprice, PT 03/05/2019, 5:16 PM  [facility] [address] [place], [place], [address] Phone: [phone]   Fax:  [phone]  Name: Albert Barr MRN: [id number] Date of Birth: 16-Mar-1941

## 2019-03-06 ENCOUNTER — Ambulatory Visit: Payer: Medicare Other | Admitting: Physical Therapy

## 2019-03-06 ENCOUNTER — Encounter: Payer: Self-pay | Admitting: Physical Therapy

## 2019-03-06 DIAGNOSIS — R2689 Other abnormalities of gait and mobility: Secondary | ICD-10-CM

## 2019-03-06 DIAGNOSIS — M6281 Muscle weakness (generalized): Secondary | ICD-10-CM | POA: Diagnosis not present

## 2019-03-06 DIAGNOSIS — R262 Difficulty in walking, not elsewhere classified: Secondary | ICD-10-CM

## 2019-03-06 NOTE — Therapy (Signed)
[place], [place], [address] Phone: [phone]   Fax:  [phone]  Physical Therapy Treatment/Discharge  Patient Details  Name: Albert Barr MRN: [id number] Date of Birth: 1941/03/15 Referring Provider (PT): Stefanie Libel, MD   Encounter Date: 03/06/2019  PT End of Session - 03/06/19 0935    Visit Number  11    Number of Visits  12    Authorization Type  UHC/Medicare/Tricare    PT Start Time  0933    PT Stop Time  1002    PT Time Calculation (min)  29 min    Activity Tolerance  Patient tolerated treatment well    Behavior During Therapy  [facility] for tasks assessed/performed       Past Medical History:  Diagnosis Date  . Allergy   . Anal fissure   . Arthritis    shoulder, knees  . Asthma   . Atrophic gastritis without mention of hemorrhage   . Esophageal reflux   . Family history of gastric cancer   . Fibula fracture    hair line fracture  . Gastric polyps    history  . Hiatal hernia   . Hx of adenomatous colonic polyps   . Hyperlipidemia   . Metaplasia of esophagus 2011   "gastric metaplasia"  . Morbid obesity ([place])   . Obstructive sleep apnea on CPAP   . Other voice and resonance disorders   . Peripheral neuropathy   . Polyposis syndrome gastric and colonic - attenuated 12/09/2015  . Sleep apnea   . Squamous cell skin cancer 04/2018   lesion left forearm  . Unspecified asthma(493.90)     Past Surgical History:  Procedure Laterality Date  . APPENDECTOMY    . CHOLECYSTECTOMY    . COLONOSCOPY  08/20/2009 (multiple)   Tubular adenoma  . ESOPHAGOGASTRODUODENOSCOPY  08/20/09 (multiple)  . INCISION AND DRAINAGE PERIRECTAL ABSCESS  2007  . KNEE SURGERY     arthroscopy- both knees  . POLYPECTOMY    . UPPER GASTROINTESTINAL ENDOSCOPY      There were no vitals filed for this visit.      [facility] PT Assessment - 03/06/19 0001      Assessment   Medical Diagnosis  R ankle pain, bil  peripheral neuropathy    Referring Provider (PT)  Stefanie Libel, MD      Observation/Other Assessments   Focus on Therapeutic Outcomes (FOTO)   54 % limitation                   [facility] Adult PT Treatment/Exercise - 03/06/19 0001      Knee/Hip Exercises: Aerobic   Nustep  L6 Le only 7 min      Knee/Hip Exercises: Standing   Gait Training  walking in clinic to fatigue             PT Education - 03/06/19 1007    Education Details  goals discussion, exercise moving forward    Person(s) Educated  Patient    Methods  Explanation    Comprehension  Verbalized understanding       PT Short Term Goals - 02/13/19 1536      PT SHORT TERM GOAL #1   Title  Patient indepdent with his initial HEP.    Status  Achieved        PT Long Term Goals - 03/06/19 6546      PT LONG TERM GOAL #1   Title  Patient verbalizes /  demonstrates understanding of ongoing HEP / fitness plan. (01/21/2019)    Status  Achieved      PT LONG TERM GOAL #2   Title  Patient ambulates with AFOs & 1000 feet modified independent.    Baseline  555 feet without rest today- "could have probably walked farther but didnt want to risk it"    Status  Not Met      PT LONG TERM GOAL #3   Title  Patient negotiates ramps, curbs & stairs with  AFOs  independent.  (01/21/2019)    Baseline  independent but performs curbs sideways    Status  Partially Met      PT LONG TERM GOAL #4   Title  Berg Balance >35/56 to indicate lower fall risk.  (01/21/2019)    Baseline  24/56    Status  Not Met      PT LONG TERM GOAL #5   Title  Pt will improve his FOTO score from 54% limitation to </= 43% limitation.    Baseline  54% limited    Status  Not Met            Plan - 03/06/19 0950    Clinical Impression Statement  Pt has made some progress since beginning PT, he reports feeling more stable & confident in his balance ability, BERG improved from 19 to 24/56 and his gait pattern is smooth and straight line. At this  time, he feels prepared to d/c to pool workouts and return to walking at the [facility]. Feels like he can use his feet/LE muscles to create balance without use of UE. encouraged him to continue current workout program that he has developed and contact us with any further questions.    PT Treatment/Interventions  ADLs/Self Care Home Management;Gait training;Stair training;Functional mobility training;Therapeutic activities;Therapeutic exercise;Balance training;Neuromuscular re-education;Patient/family education;Manual techniques;Passive range of motion;Energy conservation    PT Home Exercise Plan  sit to stand instructions, continued pool exercises ( marching, hip abd, calf raises); hip isometrics; 2x15mn bike, wall sits, step taps in pool, sit>stand staggered stance without UE    Consulted and Agree with Plan of Care  Patient       Patient will benefit from skilled therapeutic intervention in order to improve the following deficits and impairments:  Difficulty walking, Decreased balance, Decreased mobility, Obesity, Pain, Decreased activity tolerance  Visit Diagnosis: Muscle weakness (generalized)  Other abnormalities of gait and mobility  Difficulty in walking, not elsewhere classified     Problem List Patient Active Problem List   Diagnosis Date Noted  . Bilateral swelling of feet and ankles 12/18/2018  . Acute right ankle pain 09/18/2018  . Foot drop, bilateral 01/25/2018  . Morbid obesity with body mass index of 40.0-44.9 in adult ([place] 12/04/2017  . Genetic testing 03/03/2016  . Polyposis syndrome gastric and colonic - attenuated 12/09/2015  . Pre-operative clearance 11/13/2015  . Essential hypertension 11/13/2015  . NS (nuclear sclerosis) 03/11/2015  . Degenerative arthritis of lumbar spine 09/03/2014  . Lumbar and sacral osteoarthritis 09/03/2014  . Ocular rosacea 08/19/2014  . Incomplete rotator cuff tear 07/22/2014  . Conjunctival chalasis 02/12/2014  . Dry eye syndrome  02/12/2014  . Dry eye 02/12/2014  . 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 Disease of eyelid 02/12/2014  . SPL (spondylolisthesis) 11/27/2013  . Family history of gastric cancer 05/16/2013  . Family history of cancer of digestive organ 05/16/2013  . Imbalance 06/14/2012  . Peripheral neuropathic pain 06/14/2012  . Neuropathy 06/14/2012  . Glaucoma  suspect 11/03/2011  . Cataract, nuclear 11/03/2011  . Posterior vitreous detachment 11/03/2011  . Hole, retinal 11/03/2011  . Ache in joint 05/26/2011  . Gastro-esophageal reflux disease without esophagitis 05/26/2011  . Acid reflux 05/26/2011  . Hypercholesterolemia 05/26/2011  . Hyperlipidemia 05/27/2009  . Morbid obesity ([name]) 05/27/2009  . GERD 09/11/2008  . History of colonic polyps 09/11/2008  . Multiple gastric polyps 09/11/2008  . H/O disease 09/11/2008  . HOARSENESS 09/10/2008  . SLEEP APNEA 11/05/2007  . ASTHMA 10/12/2007  . Airway hyperreactivity 10/12/2007  . GASTRITIS, CHRONIC 08/01/2006  . AG (atrophic gastritis) 08/01/2006    PHYSICAL THERAPY DISCHARGE SUMMARY  Visits from Start of Care: 11  Current functional level related to goals / functional outcomes: See above   Remaining deficits: See above   Education / Equipment: Anatomy of condition, POC, HEP, exercise form/rationale  Plan: Patient agrees to discharge.  Patient goals were partially met. Patient is being discharged due to being pleased with the current functional level.  ?????     Faryn Sieg C. Donnesha Karg PT, DPT 03/06/19 10:08 AM   [place], [place], [address] Phone: [phone]   Fax:  [phone]  Name: Albert Barr MRN: [id number] Date of Birth: [birth year]

## 2019-03-26 ENCOUNTER — Ambulatory Visit: Payer: Medicare Other | Admitting: Adult Health

## 2019-08-06 ENCOUNTER — Telehealth: Payer: Medicare Other | Admitting: Adult Health

## 2019-08-27 ENCOUNTER — Telehealth: Payer: Self-pay

## 2019-08-27 ENCOUNTER — Telehealth: Payer: Medicare PPO | Admitting: Adult Health

## 2019-08-27 NOTE — Telephone Encounter (Signed)
Patient was a no show for their virtual visit today.  

## 2019-10-03 ENCOUNTER — Other Ambulatory Visit: Payer: Self-pay | Admitting: Sports Medicine

## 2019-12-15 ENCOUNTER — Other Ambulatory Visit: Payer: Self-pay | Admitting: Sports Medicine

## 2020-02-20 ENCOUNTER — Other Ambulatory Visit: Payer: Self-pay | Admitting: Sports Medicine

## 2020-02-26 ENCOUNTER — Other Ambulatory Visit: Payer: Self-pay | Admitting: Sports Medicine

## 2020-03-03 ENCOUNTER — Other Ambulatory Visit: Payer: Self-pay

## 2020-03-03 ENCOUNTER — Ambulatory Visit (INDEPENDENT_AMBULATORY_CARE_PROVIDER_SITE_OTHER): Payer: Medicare PPO | Admitting: Sports Medicine

## 2020-03-03 DIAGNOSIS — R600 Localized edema: Secondary | ICD-10-CM

## 2020-03-03 MED ORDER — HYDROCHLOROTHIAZIDE 25 MG PO TABS: 12.5000 mg | ORAL_TABLET | Freq: Every day | ORAL | 0 refills | Status: DC

## 2020-03-03 NOTE — Patient Instructions (Addendum)
Please take 1/2 tablet of hydrochlorothiazide daily to help decrease the swelling in her leg.   Please notify your primary care physician that you will be taking this new medication as it can cause electrolyte changes and these will need to be monitored while taking this medication.   Please return to see Dr. Oneida Barr in 1 month.   Hydrochlorothiazide, HCTZ What is this medicine? HYDROCHLOROTHIAZIDE is a diuretic . It treats high blood pressure. This medicine may be used for other purposes; ask your health care provider or pharmacist if you have questions.  What should I tell my health care provider before I take this medicine? They need to know if you have any of these conditions:  decreased urine  diabetes  if you are on a special diet, like a low salt diet  immune system problems, like lupus  kidney disease  liver disease  an unusual or allergic reaction to irbesartan, hydrochlorothiazide, sulfa drugs, other medicines, foods, dyes, or preservatives  pregnant or trying to get pregnant  breast-feeding  How should I use this medicine? Take this drug by mouth. Take it as directed on the prescription label at the same time every day. You can take it with or without food. If it upsets your stomach, take it with food. Keep taking it unless your health care provider tells you to stop. Talk to your health care provider about the use of this drug in children. Special care may be needed. Overdosage: If you think you have taken too much of this medicine contact a poison control center or emergency room at once. NOTE: This medicine is only for you. Do not share this medicine with others.  What if I miss a dose? If you miss a dose, take it as soon as you can. If it is almost time for your next dose, take only that dose. Do not take double or extra doses.  What may interact with this medicine?  barbiturates like phenobarbital  corticosteroids like prednisone  diabetic  medicines  diuretics like triamterene, spironolactone or amiloride  lithium  NSAIDs like ibuprofen  potassium salts or potassium supplements  prescription pain medicines  skeletal muscle relaxants like tubocurarine  some cholesterol lowering medicines like cholestyramine or colestipol This list may not describe all possible interactions. Give your health care provider a list of all the medicines, herbs, non-prescription drugs, or dietary supplements you use. Also tell them if you smoke, drink alcohol, or use illegal drugs. Some items may interact with your medicine. What should I watch for while using this medicine? You must visit your health care professional for regular checks on your progress. Check your blood pressure regularly while you are taking this medicine. Ask your doctor or health care professional what your blood pressure should be and when you should contact him or her. When you check your blood pressure, write down the measurements to show your doctor or health care professional. You must not get dehydrated. Ask your doctor or health care professional how much fluid you need to drink a day. Check with him or her if you get an attack of severe diarrhea, nausea and vomiting, or if you sweat a lot. The loss of too much body fluid can make it dangerous for you to take this medicine. Women should inform their doctor if they wish to become pregnant or think they might be pregnant. There is a potential for serious side effects to an unborn child, particularly in the second or third trimester. Talk to your  health care professional or pharmacist for more information. You may get drowsy or dizzy. Do not drive, use machinery, or do anything that needs mental alertness until you know how this drug affects you. Do not stand or sit up quickly, especially if you are an older patient. This reduces the risk of dizzy or fainting spells. Alcohol can make you more drowsy and dizzy. Avoid alcoholic  drinks. This medicine may increase blood sugar. Ask your healthcare provider if changes in diet or medicines are needed if you have diabetes. Avoid salt substitutes unless you are told otherwise by your doctor or health care professional. Talk to your health care professional about your risk of skin cancer. You may be more at risk for skin cancer if you take this medicine. This medicine can make you more sensitive to the sun. Keep out of the sun. If you cannot avoid being in the sun, wear protective clothing and use sunscreen. Do not use sun lamps or tanning beds/booths. Do not treat yourself for coughs, colds, or pain while you are taking this medicine without asking your doctor or health care professional for advice. Some ingredients may increase your blood pressure.  What side effects may I notice from receiving this medicine? Side effects that you should report to your doctor or health care professional as soon as possible:  allergic reactions like skin rash, itching or hives, swelling of the face, lips, or tongue  breathing problems  changes in vision  dark urine  eye pain  fast or irregular heart beat, palpitations, or chest pain  feeling faint or lightheaded  muscle cramps  persistent dry cough  redness, blistering, peeling or loosening of the skin, including inside the mouth   signs and symptoms of high blood sugar such as being more thirsty or hungry or having to urinate more than normal. You may also feel very tired or have blurry vision.  stomach pain  trouble passing urine  unusual bleeding or bruising  worsened gout pain  yellowing of the eyes or skin Side effects that usually do not require medical attention (report to your doctor or health care professional if they continue or are bothersome):  change in sex drive or performance  headache This list may not describe all possible side effects. Call your doctor for medical advice about side effects. You may  report side effects to FDA at 1-800-FDA-1088. Where should I keep my medicine? Keep out of the reach of children and pets. Store at room temperature between 15 and 30 degrees C (59 and 86 degrees F). Throw away any unused drug after the expiration date. NOTE: This sheet is a summary. It may not cover all possible information. If you have questions about this medicine, talk to your doctor, pharmacist, or health care provider.  2020 Elsevier/Gold Standard (2019-02-22 18:52:06)

## 2020-03-03 NOTE — Assessment & Plan Note (Addendum)
Left lower extremity edema likely secondary to excessive compression in setting of brace and compression stocking use.  -Felt padding applied to left lower extremity brace to offload pressure -Prescribed hydrochlorothiazide 12.5 mg daily for 1 month -Patient to follow-up in 1 month  Note he carries a Dx of HTN but not actively treating so HCTZ may be helpful to lower BP as well  Has long standing edema somewhat related to neuropathy but this seems more Left sided and compression may be a factor He felt some relief when I added a felt pad under his cross strap on his AFO brace and will try this to take off some direct pressure

## 2020-03-03 NOTE — Progress Notes (Signed)
   PCP: Reynold Bowen, MD  Subjective:   HPI: Patient is a 79 y.o. male here for bilateral lower extremity pain.  Patient reports that his right lower extremity appears to have more neuropathic pain consistent with his neuropathy and reports that his left lower extremity has more of a "bone like pain".  Patient reports having pain with ambulation in his left lower extremity greater than the right lower extremity.  He reports using a pillow to help elevate his right lower extremity at night while sleeping and that helps with the burning pain.  Patient reports taking NSAIDs to help with his left lower extremity pain as well as the arthritis in his shoulders.  He reports taking gabapentin to help with his neuropathy otherwise.  Patient uses AFO braces for bilateral lower extremities to compensate for foot drop.  AFO bracing helped tremendously.  Patient is planning to have bariatric surgery in the next 6 months and regularly follows with his primary care physician.  He states that ambulation has been more challenging as of late due to new onset double vision.  Review of Systems:  Per HPI.  Note recent evaluation for double vision and having this now Balance is poor  [place] and medications reviewed. Note BP shows minor elevation in my office on several visits      Objective:  Physical Exam: BP (!) 143/61   Ht 5' 9.5" (1.765 m)   Wt 295 lb (133.8 kg)   BMI 42.94 kg/m    Gen: awake, alert, NAD, comfortable in exam room; morbid obesity Pulm: breathing unlabored Extremities: Notable left lower extremity edema 2+ pitting, compression stocking/brace imprint noted Gait: Some valgus bowing noted, no foot drop appreciated in setting of lower extremity braces, patient responds to sensation exam normally  Mild palpable tenderness along anterior tibia on left where brace strut crosses  Ambulation is challenging and fatigues fairly quickly He feels less steady with turning and thinks visual change  makes this worse   Assessment & Plan:    Leg edema, left Left lower extremity edema likely secondary to excessive compression in setting of brace and compression stocking use.  -Felt padding applied to left lower extremity brace to offload pressure -Prescribed hydrochlorothiazide 12.5 mg daily for 1 month -Patient to follow-up in 1 month   Meds ordered this encounter  Medications  . hydrochlorothiazide (HYDRODIURIL) 25 MG tablet    Sig: Take 0.5 tablets (12.5 mg total) by mouth daily.    Dispense:  60 tablet    Refill:  0    Albert Foster, MD [place], PGY2 03/03/2020 10:54 AM   I observed and examined the patient with the resident and agree with assessment and plan.  Note reviewed and modified by me. Albert Mcgill, MD

## 2020-03-24 ENCOUNTER — Other Ambulatory Visit: Payer: Self-pay | Admitting: Sports Medicine

## 2020-03-31 ENCOUNTER — Other Ambulatory Visit: Payer: Self-pay

## 2020-03-31 ENCOUNTER — Ambulatory Visit (INDEPENDENT_AMBULATORY_CARE_PROVIDER_SITE_OTHER): Payer: Medicare PPO | Admitting: Sports Medicine

## 2020-03-31 VITALS — BP 118/54 | Ht 69.5 in | Wt 298.0 lb

## 2020-03-31 DIAGNOSIS — R600 Localized edema: Secondary | ICD-10-CM | POA: Diagnosis not present

## 2020-03-31 NOTE — Progress Notes (Addendum)
   Albert Barr is a 79 y.o. male who presents to [facility] today for the following:  Left Leg Swelling Seen on 9/7 Was given HCTZ for swelling Swelling has improved a little bit, but not completely resolved BP has improved with addition as well Has chronic bilateral knee pain 2/2 OA and neuropathy with bilateral foot drop Swelling doesn't cause any discomfort Note he gets pitting and pressure particularly under the Left AFO cross strap  PMH reviewed.  ROS as above. Medications reviewed.  Exam:  BP (!) 118/54   Ht 5' 9.5" (1.765 m)   Wt 298 lb (135.2 kg)   BMI 43.38 kg/m  Gen: Well NAD Extremities: 1+ pitting edema LLE, trace pitting edema RLE, sock and AFO brace imprint L>R Gait: mild valgus bowing, no foot drop in setting of AFOs  Gait is broad based and relatively stable in forward walk/ somewhat unsteady with turns  No results found.   Assessment and Plan: 1) Leg edema, left Overall doing well.  Having some improvement with addition of HCTZ, as well as his blood pressure.  Given the BP is at 118/54 today, would not increase any further.  Felt padding added to left AFO brace to decrease pressure and reduce risk of ulcer formation.  Patient was able to ambulate without pain with new felt padding.  Return as needed.   [place] Constable, D.O.  PGY-3 Family Medicine  03/31/2020 10:49 AM   I observed and examined the patient with the resident and agree with assessment and plan.  Note reviewed and modified by me. Ila Mcgill, MD

## 2020-03-31 NOTE — Assessment & Plan Note (Signed)
Overall doing well.  Having some improvement with addition of HCTZ, as well as his blood pressure.  Given the BP is at 118/54 today, would not increase any further.  Felt padding added to left AFO brace to decrease pressure and reduce risk of ulcer formation.  Patient was able to ambulate without pain with new felt padding.  Return as needed.

## 2020-04-21 ENCOUNTER — Other Ambulatory Visit: Payer: Self-pay | Admitting: Sports Medicine

## 2020-06-04 ENCOUNTER — Other Ambulatory Visit: Payer: Self-pay | Admitting: Family Medicine

## 2020-07-18 ENCOUNTER — Other Ambulatory Visit: Payer: Self-pay | Admitting: Sports Medicine

## 2020-07-28 ENCOUNTER — Encounter: Payer: Self-pay | Admitting: Internal Medicine

## 2020-09-09 ENCOUNTER — Other Ambulatory Visit: Payer: Self-pay

## 2020-09-09 ENCOUNTER — Ambulatory Visit (INDEPENDENT_AMBULATORY_CARE_PROVIDER_SITE_OTHER): Payer: Medicare PPO | Admitting: Adult Health

## 2020-09-09 VITALS — BP 125/69 | HR 75 | Ht 70.0 in | Wt 285.0 lb

## 2020-09-09 DIAGNOSIS — G4733 Obstructive sleep apnea (adult) (pediatric): Secondary | ICD-10-CM | POA: Diagnosis not present

## 2020-09-09 DIAGNOSIS — Z9989 Dependence on other enabling machines and devices: Secondary | ICD-10-CM

## 2020-09-09 NOTE — Progress Notes (Signed)
PATIENT: Albert Barr DOB: 02-01-1941  REASON FOR VISIT: follow up HISTORY FROM: patient  HISTORY OF PRESENT ILLNESS: Today 09/09/20:  [name] is a 80 year old male with a history of obstructive sleep apnea on CPAP.  He returns today for follow-up.  Reports that CPAP is working well for him.  Denies any new issues.  Returns today for follow-up.    HISTORY 03/22/18: [name] is a 80 year old male with a history of obstructive sleep apnea on CPAP.  His CPAP download indicates that he uses machine 25 out of 30 days for compliance of 83%.  He uses machine greater than 4 hours each night.  On average he uses machine 8 hours and 22 minutes.  His residual AHI is 0.8 on 8 to 12 cm of water with EPR of 1.  He reports that he occasionally will feel the mask leaking but typically it does not wake him up.  He states that the full facemask works the best for him.  His Epworth sleepiness score is 5.  REVIEW OF SYSTEMS: Out of a complete 14 system review of symptoms, the patient complains only of the following symptoms, and all other reviewed systems are negative.  FSS 29 ESS 5  ALLERGIES: Allergies  Allergen Reactions  . Beclomethasone Rash  . Moxifloxacin     REACTION: Diarrhea    HOME MEDICATIONS: Outpatient Medications Prior to Visit  Medication Sig Dispense Refill  . ADVAIR DISKUS 250-50 MCG/DOSE AEPB Inhale 1 puff into the lungs 2 (two) times daily.    Marland Kitchen albuterol (PROVENTIL HFA;VENTOLIN HFA) 108 (90 BASE) MCG/ACT inhaler Inhale 2 puffs into the lungs every 6 (six) hours as needed.    . AMBULATORY NON FORMULARY MEDICATION Wylene Men 2 1/2   Foot drop, bilateral  Codes: M21.371, M21.372 1 Device 1  . ANDROGEL PUMP 20.25 MG/ACT (1.62%) GEL 2 Squirts daily.    Marland Kitchen ascorbic acid (VITAMIN C) 250 MG tablet Take 1 tablet by mouth 2 (two) times daily.    Marland Kitchen aspirin 81 MG tablet Take 81 mg by mouth daily.    . Cyanocobalamin (B-12) 2000 MCG TABS take one a day    . esomeprazole  (NEXIUM) 40 MG capsule Take 40 mg by mouth daily before breakfast.    . fish oil-omega-3 fatty acids 1000 MG capsule Take 1 g by mouth 2 (two) times daily.    . folic acid (FOLVITE) 1 MG tablet Take 1 mg by mouth daily.    . folic acid (FOLVITE) 1 MG tablet Take 1 tablet by mouth daily.    Marland Kitchen gabapentin (NEURONTIN) 300 MG capsule TAKE 2 CAPSULES BY MOUTH 3 TIMES A DAY 180 capsule 0  . hydrochlorothiazide (HYDRODIURIL) 25 MG tablet Take 0.5 tablets (12.5 mg total) by mouth daily. 60 tablet 0  . IFEREX 150 150 MG capsule Take by mouth daily.  0  . loratadine (CLARITIN) 10 MG tablet Take 10 mg by mouth as needed.    . minocycline (MINOCIN,DYNACIN) 100 MG capsule Take 100 mg by mouth as needed. Reported on 09/07/2015    . Multiple Vitamin (VITAMIN E/FOLIC MGQQ/P-6/P-95) CAPS Take by mouth every morning.    . rosuvastatin (CRESTOR) 20 MG tablet Take 20 mg by mouth daily.    Marland Kitchen VITAMIN B COMPLEX-C PO Take 1 tablet by mouth daily.    . vitamin E 180 MG (400 UNITS) capsule Take by mouth.    . Vitamin E 400 UNITS TABS Take 1 tablet by mouth 2 (two)  times daily.    . zafirlukast (ACCOLATE) 20 MG tablet Take 20 mg by mouth 2 (two) times daily.     No facility-administered medications prior to visit.    PAST MEDICAL HISTORY: Past Medical History:  Diagnosis Date  . Allergy   . Anal fissure   . Arthritis    shoulder, knees  . Asthma   . Atrophic gastritis without mention of hemorrhage   . Esophageal reflux   . Family history of gastric cancer   . Fibula fracture    hair line fracture  . Gastric polyps    history  . Hiatal hernia   . Hx of adenomatous colonic polyps   . Hyperlipidemia   . Metaplasia of esophagus 2011   "gastric metaplasia"  . Morbid obesity ([place])   . Obstructive sleep apnea on CPAP   . Other voice and resonance disorders   . Peripheral neuropathy   . Polyposis syndrome gastric and colonic - attenuated 12/09/2015  . Sleep apnea   . Squamous cell skin cancer 04/2018    lesion left forearm  . Unspecified asthma(493.90)     PAST SURGICAL HISTORY: Past Surgical History:  Procedure Laterality Date  . APPENDECTOMY    . CHOLECYSTECTOMY    . COLONOSCOPY  08/20/2009 (multiple)   Tubular adenoma  . ESOPHAGOGASTRODUODENOSCOPY  08/20/09 (multiple)  . INCISION AND DRAINAGE PERIRECTAL ABSCESS  2007  . KNEE SURGERY     arthroscopy- both knees  . POLYPECTOMY    . UPPER GASTROINTESTINAL ENDOSCOPY      FAMILY HISTORY: Family History  Problem Relation Age of Onset  . Stomach cancer Mother 43  . Cancer Sister        bladder  . Colon cancer Neg Hx   . Esophageal cancer Neg Hx   . Rectal cancer Neg Hx     SOCIAL HISTORY: Social History   Socioeconomic History  . Marital status: Married    Spouse name: Not on file  . Number of children: 3  . Years of education: Not on file  . Highest education level: Not on file  Occupational History  . 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 Smoking status: Former Smoker    Packs/day: 2.00    Years: 10.00    Pack years: 20.00    Types: Cigarettes    Quit date: 02/16/1974    Years since quitting: 46.5  . Smokeless tobacco: Never Used  Vaping Use  . Vaping Use: Never used  Substance and Sexual Activity  . Alcohol use: Yes    Comment: moderate  . Drug use: No  . Sexual activity: Not on file  Other Topics Concern  . 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, international aid [employer]   Previous Network engineer of Location manager administration   1 term [place] [employer]   [employer] [employer], retired, Engineer, drilling   Number smoker, moderate alcohol use described, no drug use   Social Determinants of Radio broadcast assistant Strain: Not on [employer] Insecurity: Not on file  Transportation Needs: Not on file  Physical Activity: Not on file  Stress: Not on file   Social Connections: Not on file  Intimate Partner Violence: Not on file      PHYSICAL EXAM  Vitals:   09/09/20 1414  BP: 125/69  Pulse: 75  Weight: 285 lb (129.3 kg)  Height: 5\' 10"  (1.778 m)   Body mass index is 40.89 kg/m.  Generalized: Well developed, in no acute distress  Chest: Lungs clear to auscultation bilaterally  Neurological examination  Mentation: Alert oriented to time, place, history taking. Follows all commands speech and language fluent Cranial nerve II-XII: Extraocular movements were full, visual field were full on confrontational test Head turning and shoulder shrug  were normal and symmetric. Motor: The motor testing reveals 5 over 5 strength of all 4 extremities. Good symmetric motor tone is noted throughout.  Sensory: Sensory testing is intact to soft touch on all 4 extremities. No evidence of extinction is noted.  Gait and station: Gait is normal.    DIAGNOSTIC DATA (LABS, IMAGING, TESTING) - I reviewed patient records, labs, notes, testing and imaging myself where available.  No results found for: WBC, HGB, HCT, MCV, PLT    Component Value Date/Time   NA 145 04/23/2008 1012   K 4.1 04/23/2008 1012   CL 108 04/23/2008 1012   CO2 31 04/23/2008 1012   GLUCOSE 96 04/23/2008 1012   BUN 13 04/23/2008 1012   CREATININE 0.8 04/23/2008 1012   CALCIUM 9.2 04/23/2008 1012   PROT 6.9 04/23/2008 1012   ALBUMIN 4.0 04/23/2008 1012   AST 23 04/23/2008 1012   ALT 25 04/23/2008 1012   ALKPHOS 38 (L) 04/23/2008 1012   BILITOT 0.6 04/23/2008 1012   GFRNONAA 102 04/23/2008 1012   GFRAA 124 04/23/2008 1012   Lab Results  Component Value Date   CHOL 156 04/23/2008   HDL 42.3 04/23/2008   LDLCALC 89 04/23/2008   TRIG 122 04/23/2008   CHOLHDL 3.7 CALC 04/23/2008   Lab Results  Component Value Date   HGBA1C 5.6 05/18/2007   No results found for: VITAMINB12 No results found for: TSH    ASSESSMENT AND PLAN 80 y.o. year old male  has a past medical  history of Allergy, Anal fissure, Arthritis, Asthma, Atrophic gastritis without mention of hemorrhage, Esophageal reflux, Family history of gastric cancer, Fibula fracture, Gastric polyps, Hiatal hernia, adenomatous colonic polyps, Hyperlipidemia, Metaplasia of esophagus (2011), Morbid obesity ([place]), Obstructive sleep apnea on CPAP, Other voice and resonance disorders, Peripheral neuropathy, Polyposis syndrome gastric and colonic - attenuated (12/09/2015), Sleep apnea, Squamous cell skin cancer (04/2018), and Unspecified asthma(493.90). here with:  1. OSA on CPAP  - CPAP compliance excellent - Good treatment of AHI   -Pressure increased 8-13 - Encourage patient to use CPAP nightly and > 4 hours each night - F/U in 1 year or sooner if needed   I spent 20 minutes of face-to-face and non-face-to-face time with patient.  This included previsit chart review, lab review, study review, order entry, electronic health record documentation, patient education.  Ward Givens, MSN, NP-C 09/09/2020, 2:31 PM [facility] [address], [place], [place] [address] [phone]

## 2020-09-09 NOTE — Patient Instructions (Signed)
Continue using CPAP nightly and greater than 4 hours each night °If your symptoms worsen or you develop new symptoms please let us know.  ° °

## 2020-09-14 ENCOUNTER — Encounter: Payer: Medicare PPO | Admitting: Internal Medicine

## 2020-09-15 ENCOUNTER — Ambulatory Visit (INDEPENDENT_AMBULATORY_CARE_PROVIDER_SITE_OTHER): Payer: Medicare PPO | Admitting: Internal Medicine

## 2020-09-15 ENCOUNTER — Other Ambulatory Visit: Payer: Self-pay

## 2020-09-15 ENCOUNTER — Encounter: Payer: Self-pay | Admitting: Internal Medicine

## 2020-09-15 VITALS — BP 120/62 | HR 88 | Ht 67.25 in | Wt 288.0 lb

## 2020-09-15 DIAGNOSIS — D126 Benign neoplasm of colon, unspecified: Secondary | ICD-10-CM | POA: Diagnosis not present

## 2020-09-15 DIAGNOSIS — K317 Polyp of stomach and duodenum: Secondary | ICD-10-CM | POA: Diagnosis not present

## 2020-09-15 DIAGNOSIS — K2940 Chronic atrophic gastritis without bleeding: Secondary | ICD-10-CM | POA: Diagnosis not present

## 2020-09-15 NOTE — Patient Instructions (Addendum)
Ask Dr Andreas Blower about your shoulder pain.  Consider Salon pas and or Voltaren gel - it is safe for the stomach and kidneys. These can be purchased over the counter.  You have been scheduled for an endoscopy and colonoscopy. Please follow the written instructions given to you at your visit today. Please pick up your prep supplies at the pharmacy within the next 1-3 days. If you use inhalers (even only as needed), please bring them with you on the day of your procedure.  If you are age 3 or older, your body mass index should be between 23-30. Your Body mass index is 44.77 kg/m. If this is out of the aforementioned range listed, please consider follow up with your Primary Care Provider.  Due to recent changes in healthcare laws, you may see the results of your imaging and laboratory studies on MyChart before your provider has had a chance to review them.  We understand that in some cases there may be results that are confusing or concerning to you. Not all laboratory results come back in the same time frame and the provider may be waiting for multiple results in order to interpret others.  Please give [place] 48 hours in order for your provider to thoroughly review all the results before contacting the office for clarification of your results.   Thank you for choosing me and [facility].  Gatha Mayer, MD, [facility]

## 2020-09-15 NOTE — Progress Notes (Signed)
Albert Barr 80 y.o. 1940-10-25 [id number]  Assessment & Plan:   Encounter Diagnoses  Name Primary?  . Polyposis syndrome gastric and colonic - attenuated Yes  . Multiple gastric polyps   . Atrophic gastritis without hemorrhage     Schedule surveillance EGD and colonoscopy.  The risks and benefits as well as alternatives of endoscopic procedure(s) have been discussed and reviewed. All questions answered. The patient agrees to proceed.  I appreciate the opportunity to care for this patient. CC: Albert Bowen, MD    Subjective:   Chief Complaint: History of colon polyps and gastric polyps  HPI Albert Barr presents for follow-up today it has been 2 years since he had EGD and colonoscopy.  He is still very functional though he has to wear braces on his lower extremities due to neuropathy.  He has a history of a gastric and colonic polyposis syndrome, history of atrophic gastritis and his mother died of gastric cancer.  We have been doing surveillance procedures given this.  He still desires to proceed.  No active GI symptoms.  Wt Readings from Last 3 Encounters:  09/15/20 288 lb (130.6 kg)  09/09/20 285 lb (129.3 kg)  03/31/20 298 lb (135.2 kg)    Allergies  Allergen Reactions  . Beclomethasone Rash  . Moxifloxacin     REACTION: Diarrhea   Current Meds  Medication Sig  . ADVAIR DISKUS 250-50 MCG/DOSE AEPB Inhale 1 puff into the lungs 2 (two) times daily.  Marland Kitchen albuterol (PROVENTIL HFA;VENTOLIN HFA) 108 (90 BASE) MCG/ACT inhaler Inhale 2 puffs into the lungs every 6 (six) hours as needed.  . AMBULATORY NON FORMULARY MEDICATION Wylene Men 2 1/2   Foot drop, bilateral  Codes: M21.371, M21.372  . ANDROGEL PUMP 20.25 MG/ACT (1.62%) GEL 2 Squirts daily.  Marland Kitchen ascorbic acid (VITAMIN C) 250 MG tablet Take 1 tablet by mouth 2 (two) times daily.  Marland Kitchen aspirin 81 MG tablet Take 81 mg by mouth daily.  . Cyanocobalamin (B-12) 2000 MCG TABS take one a day  . cycloSPORINE  (RESTASIS) 0.05 % ophthalmic emulsion Place 1 drop into both eyes 2 (two) times daily.  Marland Kitchen ezetimibe (ZETIA) 10 MG tablet Take 1 tablet by mouth daily.  . fish oil-omega-3 fatty acids 1000 MG capsule Take 1 g by mouth 2 (two) times daily.  . folic acid (FOLVITE) 1 MG tablet Take 1 tablet by mouth as needed.  . gabapentin (NEURONTIN) 300 MG capsule TAKE 2 CAPSULES BY MOUTH 3 TIMES A DAY  . hydrochlorothiazide (HYDRODIURIL) 25 MG tablet Take 0.5 tablets (12.5 mg total) by mouth daily.  . Iron Polysacch Cmplx-B12-FA (IFEREX 150 FORTE PO) Take 1 tablet by mouth daily.  Marland Kitchen loratadine (CLARITIN) 10 MG tablet Take 10 mg by mouth as needed.  . minocycline (MINOCIN,DYNACIN) 100 MG capsule Take 100 mg by mouth as needed. Reported on 09/07/2015  . mirabegron ER (MYRBETRIQ) 25 MG TB24 tablet Take 1 tablet by mouth daily.  . Multiple Vitamin (VITAMIN E/FOLIC XIPJ/A-2/N-05) CAPS Take by mouth every morning.  Marland Kitchen omeprazole (PRILOSEC) 40 MG capsule Take 1 capsule by mouth daily.  Albert Barr Glycol-Propyl Glycol (SYSTANE ULTRA) 0.4-0.3 % SOLN Place 1-2 drops into both eyes as needed.  . rosuvastatin (CRESTOR) 20 MG tablet Take 20 mg by mouth daily.  . Semaglutide-Weight Management (WEGOVY) 2.4 MG/0.75ML SOAJ Inject 2.4 mg into the skin once a week.  Marland Kitchen VITAMIN B COMPLEX-C PO Take 1 tablet by mouth daily.  . Vitamin D, Ergocalciferol, (DRISDOL) 1.25 MG (50000  UNIT) CAPS capsule Take 1 capsule by mouth once a week.  . vitamin E 180 MG (400 UNITS) capsule Take by mouth.  . Vitamin E 400 UNITS TABS Take 1 tablet by mouth 2 (two) times daily.  . zafirlukast (ACCOLATE) 20 MG tablet Take 20 mg by mouth 2 (two) times daily.   Past Medical History:  Diagnosis Date  . Allergy   . Anal fissure   . Arthritis    shoulder, knees  . Asthma   . Atrophic gastritis without mention of hemorrhage   . Esophageal reflux   . Family history of gastric cancer   . Fibula fracture    hair line fracture  . Gastric polyps     history  . Hiatal hernia   . Hx of adenomatous colonic polyps   . Hyperlipidemia   . Metaplasia of esophagus 2011   "gastric metaplasia"  . Morbid obesity ([place])   . Obstructive sleep apnea on CPAP   . Other voice and resonance disorders   . Peripheral neuropathy   . Polyposis syndrome gastric and colonic - attenuated 12/09/2015  . Sleep apnea   . Squamous cell skin cancer 04/2018   lesion left forearm  . Unspecified asthma(493.90)    Past Surgical History:  Procedure Laterality Date  . APPENDECTOMY    . CHOLECYSTECTOMY    . COLONOSCOPY  08/20/2009 (multiple)   Tubular adenoma  . ESOPHAGOGASTRODUODENOSCOPY  08/20/09 (multiple)  . INCISION AND DRAINAGE PERIRECTAL ABSCESS  2007  . KNEE SURGERY     arthroscopy- both knees  . POLYPECTOMY    . 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   Review of Systems Shoulder arthritis - bilat Ibuprofen prn, voltaren gel  Objective:   Physical Exam BP 120/62 (BP Location: Left Arm, Patient Position: Sitting, Cuff Size: Normal)   Pulse 88   Ht 5' 7.25" (1.708 m) Comment: height measured without shoes  Wt 288 lb (130.6 kg)   BMI 44.77 kg/m  Obese wm NAD Lungs cta Cor NL s1s2 no rmg abd obese

## 2020-09-21 ENCOUNTER — Other Ambulatory Visit: Payer: Self-pay | Admitting: Sports Medicine

## 2020-09-23 ENCOUNTER — Other Ambulatory Visit: Payer: Self-pay | Admitting: Family Medicine

## 2020-10-20 ENCOUNTER — Other Ambulatory Visit: Payer: Self-pay | Admitting: Sports Medicine

## 2020-11-16 ENCOUNTER — Other Ambulatory Visit: Payer: Self-pay

## 2020-11-16 ENCOUNTER — Other Ambulatory Visit: Payer: Self-pay | Admitting: Sports Medicine

## 2020-11-24 ENCOUNTER — Encounter: Payer: Medicare PPO | Admitting: Internal Medicine

## 2020-11-24 ENCOUNTER — Other Ambulatory Visit: Payer: Self-pay

## 2020-11-24 ENCOUNTER — Encounter: Payer: Self-pay | Admitting: Physical Therapy

## 2020-11-24 ENCOUNTER — Ambulatory Visit: Payer: Medicare PPO | Attending: Neurology | Admitting: Physical Therapy

## 2020-11-24 DIAGNOSIS — M79604 Pain in right leg: Secondary | ICD-10-CM | POA: Insufficient documentation

## 2020-11-24 DIAGNOSIS — M545 Low back pain, unspecified: Secondary | ICD-10-CM | POA: Diagnosis present

## 2020-11-24 DIAGNOSIS — M6281 Muscle weakness (generalized): Secondary | ICD-10-CM | POA: Diagnosis present

## 2020-11-24 DIAGNOSIS — R2689 Other abnormalities of gait and mobility: Secondary | ICD-10-CM | POA: Insufficient documentation

## 2020-11-24 DIAGNOSIS — R262 Difficulty in walking, not elsewhere classified: Secondary | ICD-10-CM | POA: Diagnosis present

## 2020-11-24 NOTE — Patient Instructions (Addendum)
Access Code: [email or web address] URL: [email or web address] Date: 11/24/2020 Prepared by: Pollyann Samples  Exercises Supine Posterior Pelvic Tilt - 1 x daily - 7 x weekly - 2 sets - 10 reps Supine Bridge - 1 x daily - 7 x weekly - 2 sets - 10 reps Supine Lower Trunk Rotation - 1 x daily - 7 x weekly - 2 sets - 10 reps Hooklying Clamshell with Resistance - 1 x daily - 7 x weekly - 2 sets - 10 reps Supine Hip Adduction Isometric with Ball - 1 x daily - 7 x weekly - 2 sets - 10 reps Standing Lumbar Extension with Counter - 3 x daily - 7 x weekly - 1 sets - 10 reps Seated Hamstring Stretch - 2 x daily - 7 x weekly - 2 sets - 30sec hold

## 2020-11-24 NOTE — Therapy (Signed)
[place], [place], [address] Phone: [phone]   Fax:  [phone]  Physical Therapy Evaluation  Patient Details  Name: Albert Barr MRN: [id number] Date of Birth: 05/18/1941 Referring Provider (PT): Clayborne Dana   Encounter Date: 11/24/2020   PT End of Session - 11/24/20 1208    Visit Number 1    Number of Visits 17    Date for PT Re-Evaluation 01/19/21    Authorization Type [employer] M[employer]    Progress Note Due on Visit 10    PT Start Time 1210    PT Stop Time 1315    PT Time Calculation (min) 65 min    Activity Tolerance Patient tolerated treatment well;No increased pain    Behavior During Therapy WFL for tasks assessed/performed           Past Medical History:  Diagnosis Date  . Allergy   . Anal fissure   . Arthritis    shoulder, knees  . Asthma   . Atrophic gastritis without mention of hemorrhage   . Esophageal reflux   . Family history of gastric cancer   . Fibula fracture    hair line fracture  . Gastric polyps    history  . Hiatal hernia   . Hx of adenomatous colonic polyps   . Hyperlipidemia   . Metaplasia of esophagus 2011   "gastric metaplasia"  . Morbid obesity ([place])   . Obstructive sleep apnea on CPAP   . Other voice and resonance disorders   . Peripheral neuropathy   . Polyposis syndrome gastric and colonic - attenuated 12/09/2015  . Sleep apnea   . Squamous cell skin cancer 04/2018   lesion left forearm  . Unspecified asthma(493.90)     Past Surgical History:  Procedure Laterality Date  . APPENDECTOMY    . CHOLECYSTECTOMY    . COLONOSCOPY  08/20/2009 (multiple)   Tubular adenoma  . ESOPHAGOGASTRODUODENOSCOPY  08/20/09 (multiple)  . INCISION AND DRAINAGE PERIRECTAL ABSCESS  2007  . KNEE SURGERY     arthroscopy- both knees  . POLYPECTOMY    . UPPER GASTROINTESTINAL ENDOSCOPY      There were no vitals filed for this visit.    Subjective Assessment -  11/24/20 1222    Subjective Patient reports he has B neuropathy, now R foot started hurting and found different positioning would resolve the pain and "must be realated to the back". Sent to [facility] for the back to be assessed, who recommended PT.    Pertinent History B neuropathy with B AFOs, [place], skin CA, sleep apnea with CPAP, morbid obesity, HLD, OA, R fib fx (2020) after fall on concrete.    Limitations Other (comment)   Not apparent during the day, mostly at night sleeping positions.   Patient Stated Goals Get the nerve pain to allow sleep.    Currently in Pain? Yes    Pain Score 0-No pain    Pain Location Leg    Pain Orientation Right    Pain Descriptors / Indicators Radiating    Pain Type Chronic pain    Pain Radiating Towards whole leg    Pain Onset More than a month ago    Pain Frequency Intermittent    Multiple Pain Sites No              [facility] PT Assessment - 11/24/20 0001      Assessment   Medical Diagnosis LBP with rad M54.16    Referring  Provider (PT) Erik Obey, Adam    Onset Date/Surgical Date --   > 1 year   Next MD Visit July    Prior Therapy YES - feet/neuropathy      Precautions   Precautions None      Balance Screen   Has the patient fallen in the past 6 months No    Has the patient had a decrease in activity level because of a fear of falling?  No    Is the patient reluctant to leave their home because of a fear of falling?  No      Home Environment   Living Environment Private residence    Living Arrangements Spouse/significant other    Type of [place] Access Level entry    [place] One level    Additional [place] house with two levels      Prior Function   Level of Independence Independent    Vocation Retired      Observation/Other Assessments   Observations Altered gait without AD    Focus on Therapeutic Outcomes (FOTO)  47%   58% predicted     Sensation   Light Touch Impaired by gross assessment      AROM    Right Hip Extension --   short of neutral standing   Left Hip Extension --   short of neutral standing   Lumbar Flexion 49cm    Lumbar Extension 50%    Lumbar - Right Side Bend 53cm    Lumbar - Left Side Bend 53cm    Lumbar - Right Rotation 50%    Lumbar - Left Rotation 50%      Strength   Right Hip Flexion 4-/5    Right Hip ABduction 3-/5    Left Hip Flexion 4-/5    Left Hip ABduction 3-/5      Flexibility   Hamstrings R=40deg L=33deg      Palpation   SI assessment  NEG FABER/FADIR      Bed Mobility   Bed Mobility Supine to Sit    Sit to Supine Minimal Assistance - Patient > 75%   Using momentum                     Objective measurements completed on examination: See above findings.       [facility] Adult PT Treatment/Exercise - 11/24/20 0001      Transfers   Transfers Sit to Stand    Five time sit to stand comments  26sec with use of B UEs      Ambulation/Gait   Ambulation/Gait Yes    Ambulation/Gait Assistance 6: Modified independent (Device/Increase time)    Ambulation Distance (Feet) 100 Feet    Assistive device None    Gait Pattern Lateral trunk lean to right;Lateral trunk lean to left;Poor foot clearance - left;Poor foot clearance - right;Decreased stride length   B AFOs   Ambulation Surface Level      Lumbar Exercises: Stretches   Passive Hamstring Stretch 2 reps;30 seconds    Passive Hamstring Stretch Limitations seated    Lower Trunk Rotation Limitations x10ea dir    Standing Extension 10 reps    Standing Extension Limitations Against counter      Lumbar Exercises: Seated   Sit to Stand 5 reps    Sit to Stand Limitations B UEs 26sec      Lumbar Exercises: Supine   Pelvic Tilt 10 reps    Clam 10 reps  Clam Limitations RED    Bridge 10 reps    Other Supine Lumbar Exercises Hooklying hip add squeeze 10x3sec                  PT Education - 11/24/20 1250    Education Details Results of evaluation, FOTO score, POC, initial HEP     Person(s) Educated Patient    Methods Explanation;Demonstration;Handout    Comprehension Verbalized understanding;Verbal cues required            PT Short Term Goals - 11/24/20 1421      PT SHORT TERM GOAL #1   Title Patient will be independent with initial HEP for symptom management.    Baseline initial HEP issued today    Time 3    Period Weeks    Status New    Target Date 12/15/20      PT SHORT TERM GOAL #2   Title Patient to be instructed on proper body mechanics to reduce pain during functional tasks.    Baseline Limited knowledge    Time 2    Period Weeks    Status New      PT SHORT TERM GOAL #3   Title BERG balance performed for baseline and LTG updated to indicate balance progress.    Baseline Not performed at initial evaluation.    Time 2    Period Weeks    Status New    Target Date 12/08/20             PT Long Term Goals - 11/24/20 1424      PT LONG TERM GOAL #1   Title Patient will be independent with final HEP and progression to continue to reduce pain and symptoms after discharge.    Baseline Limited exercise at home    Time 8    Period Weeks    Status New    Target Date 01/19/21      PT LONG TERM GOAL #2   Title Hamstring length from 90/90 will improve to >50deg to reduce difficulty bending.    Baseline R=40  L=33    Time 8    Period Weeks    Status New      PT LONG TERM GOAL #3   Title FOTO score will increase to 58% as predicted for improved perception of functional abilities.    Baseline 47%    Time 8    Period Weeks    Status New      PT LONG TERM GOAL #4   Title Berg Balance improved to indicate lower fall risk.    Baseline Not scored at evaluation, to be updated when performed to indicate improved balance and reduced risk of falls.    Time 8    Period Weeks    Status New    Target Date 01/19/21      PT LONG TERM GOAL #5   Title Patient will report sleeping through the night awakening no more than 1x secondary pain.     Baseline Multiple times awakening during the night with radicular pain in R LE.    Time 8    Period Weeks    Status New    Target Date 01/19/21                  Plan - 11/24/20 1354    Clinical Impression Statement 80 yo male referred to OPPT with c/o back pain with radicular symptoms particularly while sleeping.  Patient presents to PT with  B AFOs and significant history of B lower leg neuropathy.  Patient reports this radicular pain occurs mostly in the R leg and can be alleviated with position changes.  Pain was not occuring at time of evaluation and typically does not occur much during the day.  Patient did have altered gait with reduced speed and lateral sway, no device, reaching for UE support particularly with transfers and turning with difficulty sit <> stand and supine <> sit.  Patient had significantly reduced trunk, hip and hamstring tightness as well as decreased core stability.  Patient would benefit from skilled PT to address deficits including trunk/LE mobility/flexibility, functional mobility, balance and gait to improve safety with daily tasks, reduced risk of falls and decreased radicular symptoms.    Personal Factors and Comorbidities Comorbidity 3+    Comorbidities Asthma, morbid obesity, periferal neuropathy, sleep apnia on CPAP    Examination-Activity Limitations Bathing;Bed Mobility;Bend;Lift;Locomotion Level;Reach Overhead;Sleep;Squat;Stairs;Transfers    Examination-Participation Restrictions Cleaning;Community Activity;Laundry;Meal Prep    Stability/Clinical Decision Making Stable/Uncomplicated    Clinical Decision Making Low    Rehab Potential Fair    PT Frequency 2x / week    PT Duration 8 weeks    PT Treatment/Interventions ADLs/Self Care Home Management;Aquatic Therapy;Cryotherapy;Moist Heat;Ultrasound;Gait training;Stair training;Functional mobility training;Therapeutic activities;Therapeutic exercise;Balance training;Neuromuscular re-education;Patient/family  education;Manual techniques;Passive range of motion;Energy conservation;Taping    PT Next Visit Plan Assess affectiveness of HEP, further assess balance/endurance consider BERG/TUG/6 min walk, reinforce log roll, Core/hip stability/flexibility.    PT Home Exercise Plan Access Code: [email or web address]  URL: [email or web address]  Date: 11/24/2020  Prepared by: Pollyann Samples    Exercises  Supine Posterior Pelvic Tilt - 1 x daily - 7 x weekly - 2 sets - 10 reps  Supine Bridge - 1 x daily - 7 x weekly - 2 sets - 10 reps  Supine Lower Trunk Rotation - 1 x daily - 7 x weekly - 2 sets - 10 reps  Hooklying Clamshell with Resistance - 1 x daily - 7 x weekly - 2 sets - 10 reps  Supine Hip Adduction Isometric with Ball - 1 x daily - 7 x weekly - 2 sets - 10 reps  Standing Lumbar Extension with Counter - 3 x daily - 7 x weekly - 1 sets - 10 reps  Seated Hamstring Stretch - 2 x daily - 7 x weekly - 2 sets - 30sec hold    Consulted and Agree with Plan of Care Patient           Patient will benefit from skilled therapeutic intervention in order to improve the following deficits and impairments:  Abnormal gait,Decreased balance,Decreased endurance,Decreased mobility,Difficulty walking,Impaired sensation,Decreased range of motion,Impaired perceived functional ability,Improper body mechanics,Decreased strength,Decreased activity tolerance,Pain,Obesity  Visit Diagnosis: Low back pain radiating to right lower extremity  Muscle weakness (generalized)  Other abnormalities of gait and mobility  Difficulty in walking, not elsewhere classified     Problem List Patient Active Problem List   Diagnosis Date Noted  . Leg edema, left 03/03/2020  . Bilateral swelling of feet and ankles 12/18/2018  . Acute right ankle pain 09/18/2018  . Foot drop, bilateral 01/25/2018  . Morbid obesity with body mass index of 40.0-44.9 in adult ([place]) 12/04/2017  . Genetic testing 03/03/2016  . Polyposis syndrome gastric and  colonic - attenuated 12/09/2015  . Pre-operative clearance 11/13/2015  . Essential hypertension 11/13/2015  . NS (nuclear sclerosis) 03/11/2015  . Degenerative arthritis of lumbar spine 09/03/2014  . Lumbar and sacral osteoarthritis 09/03/2014  . Ocular  rosacea 08/19/2014  . Incomplete rotator cuff tear 07/22/2014  . Conjunctival chalasis 02/12/2014  . Dry eye syndrome 02/12/2014  . Dry eye 02/12/2014  . 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 Disease of eyelid 02/12/2014  . SPL (spondylolisthesis) 11/27/2013  . Family history of gastric cancer 05/16/2013  . Family history of cancer of digestive organ 05/16/2013  . Imbalance 06/14/2012  . Peripheral neuropathic pain 06/14/2012  . Neuropathy 06/14/2012  . Glaucoma suspect 11/03/2011  . Cataract, nuclear 11/03/2011  . Posterior vitreous detachment 11/03/2011  . Hole, retinal 11/03/2011  . Ache in joint 05/26/2011  . Gastro-esophageal reflux disease without esophagitis 05/26/2011  . Acid reflux 05/26/2011  . Hypercholesterolemia 05/26/2011  . Hyperlipidemia 05/27/2009  . Morbid obesity ([place]) 05/27/2009  . GERD 09/11/2008  . History of colonic polyps 09/11/2008  . Multiple gastric polyps 09/11/2008  . H/O disease 09/11/2008  . HOARSENESS 09/10/2008  . SLEEP APNEA 11/05/2007  . ASTHMA 10/12/2007  . Airway hyperreactivity 10/12/2007  . GASTRITIS, CHRONIC 08/01/2006  . AG (atrophic gastritis) 08/01/2006    Pollyann Samples, PT 11/24/2020, 2:35 PM  [facility] [address] [place], [place], [address] Phone: [phone]   Fax:  [phone]  Name: Albert Barr MRN: [id number] Date of Birth: 1940-11-05

## 2020-12-08 ENCOUNTER — Ambulatory Visit (AMBULATORY_SURGERY_CENTER): Payer: Medicare PPO | Admitting: Internal Medicine

## 2020-12-08 ENCOUNTER — Encounter: Payer: Self-pay | Admitting: Internal Medicine

## 2020-12-08 ENCOUNTER — Other Ambulatory Visit: Payer: Self-pay

## 2020-12-08 VITALS — BP 119/69 | HR 62 | Temp 97.7°F | Resp 18 | Ht 67.25 in | Wt 288.0 lb

## 2020-12-08 DIAGNOSIS — Z86010 Personal history of colonic polyps: Secondary | ICD-10-CM | POA: Diagnosis not present

## 2020-12-08 DIAGNOSIS — K2940 Chronic atrophic gastritis without bleeding: Secondary | ICD-10-CM | POA: Diagnosis not present

## 2020-12-08 DIAGNOSIS — K317 Polyp of stomach and duodenum: Secondary | ICD-10-CM

## 2020-12-08 DIAGNOSIS — D126 Benign neoplasm of colon, unspecified: Secondary | ICD-10-CM

## 2020-12-08 DIAGNOSIS — B3781 Candidal esophagitis: Secondary | ICD-10-CM | POA: Diagnosis not present

## 2020-12-08 MED ORDER — SODIUM CHLORIDE 0.9 % IV SOLN: 500.0000 mL | Freq: Once | INTRAVENOUS | Status: DC

## 2020-12-08 NOTE — Progress Notes (Signed)
Called to room to assist during endoscopic procedure.  Patient ID and intended procedure confirmed with present staff. Received instructions for my participation in the procedure from the performing physician.  

## 2020-12-08 NOTE — Progress Notes (Signed)
VS-CW 

## 2020-12-08 NOTE — Op Note (Signed)
[facility] Patient Name: Albert Barr Procedure Date: 12/08/2020 10:52 AM MRN: [id number] Endoscopist: Gatha Mayer , MD Age: 80 Referring MD:  Date of Birth: [birth year] Gender: Male Account #: [id number] Procedure:                Upper GI endoscopy Indications:              Follow-up of gastric polyps Medicines:                Propofol per Anesthesia, Monitored Anesthesia Care Procedure:                Pre-Anesthesia Assessment:                           - Prior to the procedure, a History and Physical                            was performed, and patient medications and                            allergies were reviewed. The patient's tolerance of                            previous anesthesia was also reviewed. The risks                            and benefits of the procedure and the sedation                            options and risks were discussed with the patient.                            All questions were answered, and informed consent                            was obtained. Prior Anticoagulants: The patient has                            taken no previous anticoagulant or antiplatelet                            agents. ASA Grade Assessment: III - A patient with                            severe systemic disease. After reviewing the risks                            and benefits, the patient was deemed in                            satisfactory condition to undergo the procedure.                           After obtaining informed consent, the endoscope was  passed under direct vision. Throughout the                            procedure, the patient's blood pressure, pulse, and                            oxygen saturations were monitored continuously. The                            Endoscope was introduced through the mouth, and                            advanced to the second part of duodenum. The upper                             GI endoscopy was accomplished without difficulty.                            The patient tolerated the procedure well. Scope In: Scope Out: Findings:                 Multiple diminutive sessile polyps were found in                            the gastric body. Biopsies were taken with a cold                            forceps for histology. Verification of patient                            identification for the specimen was done. Estimated                            blood loss was minimal.                           Mild inflammation was found in the gastric antrum.                           The cardia and gastric fundus were normal on                            retroflexion.                           , yellow plaques were found in the upper third of                            the esophagus.                           The exam was otherwise without abnormality. Complications:            No immediate complications. Estimated Blood Loss:     Estimated blood loss was minimal. Estimated blood  loss was minimal. Impression:               - Multiple gastric polyps. Biopsied.                           - Gastritis.                           - Esophageal plaques were found, consistent with                            candidiasis. I think this is from [place] diskus use                           - The examination was otherwise normal. Recommendation:           - Patient has a contact number available for                            emergencies. The signs and symptoms of potential                            delayed complications were discussed with the                            patient. Return to normal activities tomorrow.                            Written discharge instructions were provided to the                            patient.                           - Resume previous diet.                           - Continue present medications. Be sure to rinse                             and swallow after Advair                           - Await pathology results.                           - See the other procedure note for documentation of                            additional recommendations. Gatha Mayer, MD 12/08/2020 11:48:48 AM This report has been signed electronically.

## 2020-12-08 NOTE — Patient Instructions (Addendum)
Stomach polyps remain small and benign in appearance - biopsies taken. There was some mild gastritis and some yeast in upper esophagus - I think related to Advair inhaler. Be sure to rinse and swallow water after using the inhaler.  NO COLON POLYPS!  I will not be recommending a routine repeat colonoscopy or upper endoscopy, I think. I will let you know pathology results and plans.  I appreciate the opportunity to care for you. Gatha Mayer, MD, FACG      YOU HAD AN ENDOSCOPIC PROCEDURE TODAY AT [facility]:   Refer to the procedure report that was given to you for any specific questions about what was found during the examination.  If the procedure report does not answer your questions, please call your gastroenterologist to clarify.  If you requested that your care partner not be given the details of your procedure findings, then the procedure report has been included in a sealed envelope for you to review at your convenience later.  YOU SHOULD EXPECT: Some feelings of bloating in the abdomen. Passage of more gas than usual.  Walking can help get rid of the air that was put into your GI tract during the procedure and reduce the bloating. If you had a lower endoscopy (such as a colonoscopy or flexible sigmoidoscopy) you may notice spotting of blood in your stool or on the toilet paper. If you underwent a bowel prep for your procedure, you may not have a normal bowel movement for a few days.  Please Note:  You might notice some irritation and congestion in your nose or some drainage.  This is from the oxygen used during your procedure.  There is no need for concern and it should clear up in a day or so.  SYMPTOMS TO REPORT IMMEDIATELY:  Following lower endoscopy (colonoscopy or flexible sigmoidoscopy):  Excessive amounts of blood in the stool  Significant tenderness or worsening of abdominal pains  Swelling of the abdomen that is new, acute  Fever of 100F or  higher  Following upper endoscopy (EGD)  Vomiting of blood or coffee ground material  New chest pain or pain under the shoulder blades  Painful or persistently difficult swallowing  New shortness of breath  Fever of 100F or higher  Black, tarry-looking stools  For urgent or emergent issues, a gastroenterologist can be reached at any hour by calling [phone]. Do not use MyChart messaging for urgent concerns.    DIET:  We do recommend a small meal at first, but then you may proceed to your regular diet.  Drink plenty of fluids but you should avoid alcoholic beverages for 24 hours.  ACTIVITY:  You should plan to take it easy for the rest of today and you should NOT DRIVE or use heavy machinery until tomorrow (because of the sedation medicines used during the test).    FOLLOW UP: Our staff will call the number listed on your records 48-72 hours following your procedure to check on you and address any questions or concerns that you may have regarding the information given to you following your procedure. If we do not reach you, we will leave a message.  We will attempt to reach you two times.  During this call, we will ask if you have developed any symptoms of COVID 19. If you develop any symptoms (ie: fever, flu-like symptoms, shortness of breath, cough etc.) before then, please call [phone].  If you test positive for Covid 19 in the 2  weeks post procedure, please call and report this information to [place].    If any biopsies were taken you will be contacted by phone or by letter within the next 1-3 weeks.  Please call us at [phone] if you have not heard about the biopsies in 3 weeks.    SIGNATURES/CONFIDENTIALITY: You and/or your care partner have signed paperwork which will be entered into your electronic medical record.  These signatures attest to the fact that that the information above on your After Visit Summary has been reviewed and is understood.  Full responsibility of  the confidentiality of this discharge information lies with you and/or your care-partner.    Resume medications. Information given on gastritis.

## 2020-12-08 NOTE — Op Note (Signed)
[facility] Patient Name: Albert Barr Procedure Date: 12/08/2020 10:52 AM MRN: [id number] Endoscopist: Gatha Mayer , MD Age: 80 Referring MD:  Date of Birth: 1941/04/17 Gender: Male Account #: [id number] Procedure:                Colonoscopy Indications:              Surveillance: History of numerous adenomas on last                            colonoscopy lifetime - attenuated polyposis Medicines:                Propofol per Anesthesia, Monitored Anesthesia Care Procedure:                Pre-Anesthesia Assessment:                           - Prior to the procedure, a History and Physical                            was performed, and patient medications and                            allergies were reviewed. The patient's tolerance of                            previous anesthesia was also reviewed. The risks                            and benefits of the procedure and the sedation                            options and risks were discussed with the patient.                            All questions were answered, and informed consent                            was obtained. Prior Anticoagulants: The patient has                            taken no previous anticoagulant or antiplatelet                            agents. ASA Grade Assessment: III - A patient with                            severe systemic disease. After reviewing the risks                            and benefits, the patient was deemed in                            satisfactory condition to undergo the procedure.  After obtaining informed consent, the colonoscope                            was passed under direct vision. Throughout the                            procedure, the patient's blood pressure, pulse, and                            oxygen saturations were monitored continuously. The                            Olympus CF-HQ190L [id number]) Colonoscope was                             introduced through the anus and advanced to the the                            cecum, identified by appendiceal orifice and                            ileocecal valve. The colonoscopy was performed with                            moderate difficulty due to significant looping.                            Successful completion of the procedure was aided by                            applying abdominal pressure. The patient tolerated                            the procedure well. The quality of the bowel                            preparation was adequate. The ileocecal valve,                            appendiceal orifice, and rectum were photographed.                            The bowel preparation used was Miralax via split                            dose instruction. Scope In: 51:88:41 AM Scope Out: 11:39:32 AM Scope Withdrawal Time: 0 hours 10 minutes 33 seconds  Total Procedure Duration: 0 hours 23 minutes 13 seconds  Findings:                 The perianal and digital rectal examinations were                            normal.  The entire examined colon appeared normal on direct                            and retroflexion views. Complications:            No immediate complications. Estimated Blood Loss:     Estimated blood loss: none. Impression:               - The entire examined colon is normal on direct and                            retroflexion views.                           - No specimens collected.                           - Personal history of colonic polyps. Attenuated                            polyposis                           tubular adenoma 2/08                           previous hyperplastic polyps                           colonoiscopies since 1988                           2011 - single adenoma                           11/26/2014 13 polyps largest 15 mm - 10 adenomas -                            repeat colon 2017                            12/09/2015 2 diminutive polyps removed right colon                            adenomas                           05/2018 5 polyps - adenomas and 1 ssp - Recommendation:           - Patient has a contact number available for                            emergencies. The signs and symptoms of potential                            delayed complications were discussed with the  patient. Return to normal activities tomorrow.                            Written discharge instructions were provided to the                            patient.                           - Resume previous diet.                           - Continue present medications.                           - See the other procedure note for documentation of                            additional recommendations.                           - No repeat colonoscopy due to age. Gatha Mayer, MD 12/08/2020 11:53:25 AM This report has been signed electronically.

## 2020-12-08 NOTE — Progress Notes (Signed)
Report to PACU, RN, vss, BBS= Clear.  

## 2020-12-16 ENCOUNTER — Encounter: Payer: Medicare PPO | Admitting: Physical Therapy

## 2020-12-19 ENCOUNTER — Encounter: Payer: Self-pay | Admitting: Internal Medicine

## 2020-12-21 ENCOUNTER — Ambulatory Visit: Payer: Medicare PPO | Admitting: Physical Therapy

## 2020-12-23 ENCOUNTER — Encounter: Payer: Medicare PPO | Admitting: Physical Therapy

## 2020-12-30 ENCOUNTER — Ambulatory Visit: Payer: Medicare PPO | Admitting: Physical Therapy

## 2021-01-04 ENCOUNTER — Other Ambulatory Visit: Payer: Self-pay

## 2021-01-04 ENCOUNTER — Ambulatory Visit: Payer: Medicare PPO | Attending: Neurology | Admitting: Physical Therapy

## 2021-01-04 ENCOUNTER — Encounter: Payer: Self-pay | Admitting: Physical Therapy

## 2021-01-04 DIAGNOSIS — R2689 Other abnormalities of gait and mobility: Secondary | ICD-10-CM | POA: Insufficient documentation

## 2021-01-04 DIAGNOSIS — M79604 Pain in right leg: Secondary | ICD-10-CM | POA: Insufficient documentation

## 2021-01-04 DIAGNOSIS — R262 Difficulty in walking, not elsewhere classified: Secondary | ICD-10-CM | POA: Diagnosis present

## 2021-01-04 DIAGNOSIS — M6281 Muscle weakness (generalized): Secondary | ICD-10-CM | POA: Diagnosis present

## 2021-01-04 DIAGNOSIS — M545 Low back pain, unspecified: Secondary | ICD-10-CM | POA: Diagnosis present

## 2021-01-04 NOTE — Patient Instructions (Signed)
Access Code: [email or web address] URL: [email or web address] Date: 01/04/2021 Prepared by: Pollyann Samples  Exercises Supine Posterior Pelvic Tilt - 1 x daily - 7 x weekly - 2 sets - 10 reps Supine Bridge - 1 x daily - 7 x weekly - 2 sets - 10 reps Supine Lower Trunk Rotation - 1 x daily - 7 x weekly - 2 sets - 10 reps Hooklying Clamshell with Resistance - 1 x daily - 7 x weekly - 2 sets - 10 reps Supine Hip Adduction Isometric with Ball - 1 x daily - 7 x weekly - 2 sets - 10 reps Standing Lumbar Extension with Counter - 3 x daily - 7 x weekly - 1 sets - 10 reps Seated Hamstring Stretch - 2 x daily - 7 x weekly - 2 sets - 30sec hold Clamshell - 1 x daily - 7 x weekly - 2 sets - 10 reps Sidelying Hip Abduction - 1 x daily - 7 x weekly - 2 sets - 10 reps

## 2021-01-04 NOTE — Therapy (Signed)
[place], [place], [address] Phone: [phone]   Fax:  [phone]  Physical Therapy Treatment/RE-CERTIFICATION  Patient Details  Name: Albert Barr MRN: [id number] Date of Birth: [birth year] Referring Provider (PT): Clayborne Dana   Encounter Date: 01/04/2021   PT End of Session - 01/04/21 1449     Visit Number 2    Number of Visits 17    Date for PT Re-Evaluation 01/19/21    Authorization Type [employer] M[employer]    Progress Note Due on Visit 10    PT Start Time 1402    PT Stop Time 3154    PT Time Calculation (min) 43 min    Activity Tolerance Patient tolerated treatment well;No increased pain    Behavior During Therapy WFL for tasks assessed/performed             Past Medical History:  Diagnosis Date   Allergy    Anal fissure    Arthritis    shoulder, knees   Asthma    Atrophic gastritis without mention of hemorrhage    Cataract    bilateraly   Esophageal reflux    Family history of gastric cancer    Fibula fracture    hair line fracture   Gastric polyps    history   Hiatal hernia    Hx of adenomatous colonic polyps    Hyperlipidemia    Hypertension    Metaplasia of esophagus 2011   "gastric metaplasia"   Morbid obesity ([place])    Obstructive sleep apnea on CPAP    Other voice and resonance disorders    Peripheral neuropathy    Polyposis syndrome gastric and colonic - attenuated 12/09/2015   Sleep apnea    uses CPAP   Squamous cell skin cancer 04/2018   lesion left forearm   Unspecified asthma(493.90)     Past Surgical History:  Procedure Laterality Date   APPENDECTOMY     CHOLECYSTECTOMY     COLONOSCOPY  08/20/2009 (multiple)   Tubular adenoma   ESOPHAGOGASTRODUODENOSCOPY  08/20/09 (multiple)   [place]  2007   KNEE SURGERY     arthroscopy- both knees   POLYPECTOMY     UPPER GASTROINTESTINAL ENDOSCOPY      There were no vitals  filed for this visit.   Subjective Assessment - 01/04/21 1406     Subjective Patient reports [place] and has not been able to come to PT but is ready to start consistantly.    Pertinent History B neuropathy with B AFOs, [place], skin CA, sleep apnea with CPAP, morbid obesity, HLD, OA, R fib fx (2020) after fall on concrete.    Currently in Pain? Yes    Pain Score 0-No pain   Max R leg pain first thing in the morning 3-4/10   Pain Location Leg    Pain Orientation Right    Pain Descriptors / Indicators Radiating    Pain Type Chronic pain    Pain Radiating Towards whole leg, more from the knee down    Pain Frequency Intermittent                [facility] PT Assessment - 01/04/21 0001       Assessment   Medical Diagnosis LBP with rad M54.16    Referring Provider (PT) Clayborne Dana    Next MD Visit July    Prior Therapy YES - feet/neuropathy  Precautions   Precautions None      Observation/Other Assessments   Observations Altered gait with B AFOs, no device      Sensation   Light Touch Impaired by gross assessment      AROM   Right Hip Extension --   short of neutral standing   Left Hip Extension --   short of neutral standing   Lumbar Flexion 45cm    Lumbar Extension 50%    Lumbar - Right Side Bend 58cm    Lumbar - Left Side Bend 55cm    Lumbar - Right Rotation 50%    Lumbar - Left Rotation 50%      Strength   Right Hip Flexion 4+/5    Left Hip Flexion 4/5      Flexibility   Hamstrings R=40deg L=33deg      Bed Mobility   Bed Mobility Supine to Sit    Sit to Supine Contact Guard/Touching assist      Ambulation/Gait   Ambulation/Gait Yes    Ambulation/Gait Assistance 6: Modified independent (Device/Increase time)    Ambulation Distance (Feet) 100 Feet    Assistive device Prostheses   B AFOs   Gait Pattern Lateral trunk lean to right;Lateral trunk lean to left;Poor foot clearance - left;Poor foot clearance -  right;Decreased stride length    Ambulation Surface Level      6 Minute walk- Post Test   6 Minute Walk Post Test yes    HR (bpm) 86    02 Sat (%RA) 96 %    Perceived Rate of Exertion (Borg) 17- Very hard      6 minute walk test results    Endurance additional comments 433      Standardized Balance Assessment   Standardized Balance Assessment Berg Balance Test      Berg Balance Test   Sit to Stand Able to stand  independently using hands    Standing Unsupported Able to stand safely 2 minutes    Sitting with Back Unsupported but Feet Supported on Floor or Stool Able to sit safely and securely 2 minutes    Stand to Sit Controls descent by using hands    Transfers Able to transfer with verbal cueing and /or supervision    Standing Unsupported with Eyes Closed Able to stand 3 seconds    Standing Unsupported with Feet Together Needs help to attain position but able to stand for 30 seconds with feet together    From Standing, Reach Forward with Outstretched Arm Can reach forward >5 cm safely (2")    From Standing Position, Pick up Object from Floor Unable to pick up and needs supervision    From Standing Position, Turn to Look Behind Over each Shoulder Needs supervision when turning    Turn 360 Degrees Needs close supervision or verbal cueing    Standing Unsupported, Alternately Place Feet on Step/Stool Needs assistance to keep from falling or unable to try    Standing Unsupported, One Foot in Front Needs help to step but can hold 15 seconds    Standing on One Leg Unable to try or needs assist to prevent fall    Total Score 25                           [facility] Adult PT Treatment/Exercise - 01/04/21 0001       Lumbar Exercises: Stretches   Passive Hamstring Stretch 2 reps;30 seconds    Passive Hamstring  Stretch Limitations seated      Lumbar Exercises: Sidelying   Clam 10 reps    Hip Abduction 10 reps   verbal cues for neutral hip                   PT  Education - 01/04/21 1512     Education Details Results of reevaltuation, ongoing POC, HEP    Person(s) Educated Patient    Methods Explanation;Demonstration;Verbal cues;Handout    Comprehension Verbalized understanding;Verbal cues required              PT Short Term Goals - 01/04/21 1503       PT SHORT TERM GOAL #1   Title Patient will be independent with initial HEP for symptom management.    Time 3    Period Weeks    Status On-going    Target Date 01/29/21      PT SHORT TERM GOAL #2   Title Patient to be instructed on proper body mechanics to reduce pain during functional tasks.    Baseline Limited knowledge    Time 2    Period Weeks    Status On-going    Target Date 01/22/21      PT SHORT TERM GOAL #3   Title BERG balance performed for baseline and LTG updated to indicate balance progress.    Baseline 25/56    Status Achieved               PT Long Term Goals - 01/04/21 1506       PT LONG TERM GOAL #1   Title Patient will be independent with final HEP and progression to continue to reduce pain and symptoms after discharge.    Baseline Limited exercise at home    Time 8    Period Weeks    Status On-going    Target Date 03/05/21      PT LONG TERM GOAL #2   Title Hamstring length from 90/90 will improve to >50deg to reduce difficulty bending.    Baseline R=40  L=33    Time 8    Period Weeks    Status On-going    Target Date 03/05/21      PT LONG TERM GOAL #3   Title FOTO score will increase to 58% as predicted for improved perception of functional abilities.    Baseline 47%    Time 8    Period Weeks    Status On-going    Target Date 03/05/21      PT LONG TERM GOAL #4   Title Berg Balance improved to >45/56 to indicate lower fall risk.    Baseline 25/56    Time 8    Period Weeks    Status On-going    Target Date 03/05/21      PT LONG TERM GOAL #5   Title Patient will report sleeping through the night awakening no more than 1x secondary  pain.    Baseline Multiple times awakening during the night with radicular pain in R LE.    Time 8    Period Weeks    Status On-going    Target Date 03/05/21                   Plan - 01/04/21 1450     Clinical Impression Statement [place] and has not returned since initial evaluation, he has not been able [facility] for [facility].  Patient was reassesed for baseline as he is ready to consistantly come to therapy. Patient has improved LE strength having done the exercises, but continues with other deficits. Patient presents to PT with B AFOs and significant history of B lower leg neuropathy. Patient reports this radicular pain occurs mostly in the R leg and can be alleviated with position changes. Pain was not occuring at time of evaluation and typically does not occur much during the day. Patient was only able to complete 4 min of the 19min walk test secondary fatigue.  BERG = 25/56 indicating high risk of falls.  Patient did have altered gait with reduced speed and lateral sway, no device, reaching for UE support particularly with transfers and turning with difficulty sit <> stand and supine <> sit. Patient had significantly reduced trunk, hip and hamstring tightness as well as decreased core stability. Patient would benefit from skilled PT to address deficits including trunk/LE mobility/flexibility, functional mobility, balance and gait to improve safety with daily tasks, reduced risk of falls and decreased radicular symptoms.    Personal Factors and Comorbidities Comorbidity 3+    Comorbidities Asthma, morbid obesity, periferal neuropathy, sleep apnia on CPAP    Examination-Activity Limitations Bathing;Bed Mobility;Bend;Lift;Locomotion Level;Reach Overhead;Sleep;Squat;Stairs;Transfers    Examination-Participation Restrictions Cleaning;Community Activity;Laundry;Meal Prep    Stability/Clinical Decision Making  Stable/Uncomplicated    Clinical Decision Making Low    Rehab Potential Fair    PT Frequency 2x / week    PT Duration 8 weeks    PT Treatment/Interventions ADLs/Self Care Home Management;Aquatic Therapy;Cryotherapy;Moist Heat;Ultrasound;Gait training;Stair training;Functional mobility training;Therapeutic activities;Therapeutic exercise;Balance training;Neuromuscular re-education;Patient/family education;Manual techniques;Passive range of motion;Energy conservation;Taping    PT Next Visit Plan Will NEED FOTO at next assessment, Assess affectiveness of HEP, further assess balance/endurance, consider performing TUG, reinforce log roll, Core/hip stability/flexibility.    PT Home Exercise Plan Access Code: [email or web address]    Consulted and Agree with Plan of Care Patient             Patient will benefit from skilled therapeutic intervention in order to improve the following deficits and impairments:  Abnormal gait, Decreased balance, Decreased endurance, Decreased mobility, Difficulty walking, Impaired sensation, Decreased range of motion, Impaired perceived functional ability, Improper body mechanics, Decreased strength, Decreased activity tolerance, Pain, Obesity  Visit Diagnosis: Low back pain radiating to right lower extremity  Muscle weakness (generalized)  Other abnormalities of gait and mobility  Difficulty in walking, not elsewhere classified     Problem List Patient Active Problem List   Diagnosis Date Noted   Leg edema, left 03/03/2020   Bilateral swelling of feet and ankles 12/18/2018   Acute right ankle pain 09/18/2018   Foot drop, bilateral 01/25/2018   Morbid obesity with body mass index of 40.0-44.9 in adult [facility]) 12/04/2017   Genetic testing 03/03/2016   Polyposis syndrome gastric and colonic - attenuated 12/09/2015   Pre-operative clearance 11/13/2015   Essential hypertension 11/13/2015   NS (nuclear sclerosis) 03/11/2015   Degenerative arthritis of lumbar spine  09/03/2014   Lumbar and sacral osteoarthritis 09/03/2014   Ocular rosacea 08/19/2014   Incomplete rotator cuff tear 07/22/2014   Conjunctival chalasis 02/12/2014   Dry eye syndrome 02/12/2014   Dry eye 02/12/2014   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   Disease of eyelid 02/12/2014   SPL (spondylolisthesis) 11/27/2013   Family history of gastric cancer 05/16/2013   Family history of cancer of digestive organ 05/16/2013   Imbalance 06/14/2012   Peripheral neuropathic pain  06/14/2012   Neuropathy 06/14/2012   Glaucoma suspect 11/03/2011   Cataract, nuclear 11/03/2011   Posterior vitreous detachment 11/03/2011   Hole, retinal 11/03/2011   Ache in joint 05/26/2011   Gastro-esophageal reflux disease without esophagitis 05/26/2011   Acid reflux 05/26/2011   Hypercholesterolemia 05/26/2011   Hyperlipidemia 05/27/2009   Morbid obesity ([place]) 05/27/2009   GERD 09/11/2008   History of colonic polyps 09/11/2008   Multiple gastric polyps 09/11/2008   H/O disease 09/11/2008   HOARSENESS 09/10/2008   SLEEP APNEA 11/05/2007   ASTHMA 10/12/2007   Airway hyperreactivity 10/12/2007   GASTRITIS, CHRONIC 08/01/2006   AG (atrophic gastritis) 08/01/2006    Pollyann Samples, PT 01/04/2021, 3:13 PM  [facility] [address] [place], [place], [address] Phone: [phone]   Fax:  [phone]  Name: Albert Barr MRN: [id number] Date of Birth: 1940/11/07   Referring diagnosis? M54.16 Treatment diagnosis? (if different than referring diagnosis) LBP with R radicular pain, difficulty walking, abnormal gait and mobility. What was this (referring dx) caused by? []  Surgery []  Fall [x]  Ongoing issue []  Arthritis []  Other: ____________  Laterality: []  Rt []  Lt [x]  Both  Check all possible CPT codes:      [x]  97110 (Therapeutic Exercise)  []  92507 (SLP Treatment)  [x]  97112 (Neuro Re-ed)   []  92526 (Swallowing Treatment)   [x]  97116 (Gait  Training)   []  [id number] (Cognitive Training, 1st 15 minutes) [x]  97140 (Manual Therapy)   []  97130 (Cognitive Training, each add'l 15 minutes)  [x]  97530 (Therapeutic Activities)  []  Other, List CPT Code ____________    [x]  63785 (Self Care)       []  All codes above (97110 - 97535)  [x]  97012 (Mechanical Traction)  [x]  97014 (E-stim Unattended)  []  97032 (E-stim manual)  []  97033 (Ionto)  []  97035 (Ultrasound)  []  97760 (Orthotic Fit) []  [id number] (Physical Performance Training) [x]  [id number] (Aquatic Therapy) []  97034 (Contrast Bath) []  [id number] (Paraffin) []  97597 (Wound Care 1st 20 sq cm) []  97598 (Wound Care each add'l 20 sq cm) []  97016 (Vasopneumatic Device) []  [id number] Comptroller) []  [id number] (Prosthetic Training)

## 2021-01-06 ENCOUNTER — Ambulatory Visit: Payer: Medicare PPO | Admitting: Physical Therapy

## 2021-01-06 ENCOUNTER — Other Ambulatory Visit: Payer: Self-pay

## 2021-01-06 ENCOUNTER — Encounter: Payer: Self-pay | Admitting: Physical Therapy

## 2021-01-06 DIAGNOSIS — M545 Low back pain, unspecified: Secondary | ICD-10-CM

## 2021-01-06 DIAGNOSIS — M79604 Pain in right leg: Secondary | ICD-10-CM

## 2021-01-06 DIAGNOSIS — M6281 Muscle weakness (generalized): Secondary | ICD-10-CM

## 2021-01-06 DIAGNOSIS — R2689 Other abnormalities of gait and mobility: Secondary | ICD-10-CM

## 2021-01-06 DIAGNOSIS — R262 Difficulty in walking, not elsewhere classified: Secondary | ICD-10-CM

## 2021-01-06 NOTE — Therapy (Signed)
[place], [place], [address] Phone: [phone]   Fax:  [phone]  Physical Therapy Treatment  Patient Details  Name: Albert Barr MRN: [id number] Date of Birth: Aug 06, 1940 Referring Provider (PT): Clayborne Dana   Encounter Date: 01/06/2021   PT End of Session - 01/06/21 1403     Visit Number 3    Number of Visits 17    Date for PT Re-Evaluation 01/19/21    Authorization Type [employer] MCR/T[employer]    Progress Note Due on Visit 10    PT Start Time 1403    PT Stop Time 6283    PT Time Calculation (min) 42 min    Activity Tolerance Patient tolerated treatment well;No increased pain    Behavior During Therapy WFL for tasks assessed/performed             Past Medical History:  Diagnosis Date   Allergy    Anal fissure    Arthritis    shoulder, knees   Asthma    Atrophic gastritis without mention of hemorrhage    Cataract    bilateraly   Esophageal reflux    Family history of gastric cancer    Fibula fracture    hair line fracture   Gastric polyps    history   Hiatal hernia    Hx of adenomatous colonic polyps    Hyperlipidemia    Hypertension    Metaplasia of esophagus 2011   "gastric metaplasia"   Morbid obesity ([place])    Obstructive sleep apnea on CPAP    Other voice and resonance disorders    Peripheral neuropathy    Polyposis syndrome gastric and colonic - attenuated 12/09/2015   Sleep apnea    uses CPAP   Squamous cell skin cancer 04/2018   lesion left forearm   Unspecified asthma(493.90)     Past Surgical History:  Procedure Laterality Date   APPENDECTOMY     CHOLECYSTECTOMY     COLONOSCOPY  08/20/2009 (multiple)   Tubular adenoma   ESOPHAGOGASTRODUODENOSCOPY  08/20/09 (multiple)   [place]  2007   KNEE SURGERY     arthroscopy- both knees   POLYPECTOMY     UPPER GASTROINTESTINAL ENDOSCOPY      There were no vitals filed for this  visit.   Subjective Assessment - 01/06/21 1403     Subjective Patient reports good compliance with HEP.  Yesterday a few cramps during HEP, but not today.    Currently in Pain? Yes    Pain Score 0-No pain    Pain Location Leg    Pain Orientation Right                [facility] PT Assessment - 01/06/21 0001       Assessment   Medical Diagnosis LBP with rad M54.16    Referring Provider (PT) Clayborne Dana    Next MD Visit July      Precautions   Precautions None      Observation/Other Assessments   Focus on Therapeutic Outcomes (FOTO)  45%                           [facility] Adult PT Treatment/Exercise - 01/06/21 0001       Bed Mobility   Bed Mobility Supine to Sit    Sit to Supine Supervision/Verbal cueing   V cues for LOG ROLL technique     Transfers  Transfers Sit to Stand      Lumbar Exercises: Standing   Heel Raises Limitations Unable    Functional Squats 10 reps    Functional Squats Limitations Light UE support    Row 20 reps    Theraband Level (Row) Level 3 (Green)    Other Standing Lumbar Exercises Chest Press GREEN for improved core stabililty x10      Lumbar Exercises: Seated   Sit to Stand Limitations 3 with extra time, back of legs from low surface      Lumbar Exercises: Supine   Bridge 10 reps    Bridge with [employer] 10 reps    Bridge with clamshell 10 reps    Straight Leg Raise 10 reps    Straight Leg Raises Limitations 2 sets      Knee/Hip Exercises: Standing   Hip Abduction 10 reps;Right;Left    Abduction Limitations Side stepping R/L along counter    Functional Squat 10 reps    Functional Squat Limitations UE support      Knee/Hip Exercises: Supine   Quad Sets 10 reps;Left;Right    Quad Sets Limitations 5sec      Ankle Exercises: Seated   Heel Raises Limitations RED PF x10    Toe Raise Limitations APs x10 for DF                    PT Education - 01/06/21 1701     Education Details Log roll technique,  DF/PF strengthening with RED band.    Person(s) Educated Patient    Methods Explanation;Demonstration;Verbal cues    Comprehension Verbalized understanding;Returned demonstration              PT Short Term Goals - 01/04/21 1503       PT SHORT TERM GOAL #1   Title Patient will be independent with initial HEP for symptom management.    Time 3    Period Weeks    Status On-going    Target Date 01/29/21      PT SHORT TERM GOAL #2   Title Patient to be instructed on proper body mechanics to reduce pain during functional tasks.    Baseline Limited knowledge    Time 2    Period Weeks    Status On-going    Target Date 01/22/21      PT SHORT TERM GOAL #3   Title BERG balance performed for baseline and LTG updated to indicate balance progress.    Baseline 25/56    Status Achieved               PT Long Term Goals - 01/04/21 1506       PT LONG TERM GOAL #1   Title Patient will be independent with final HEP and progression to continue to reduce pain and symptoms after discharge.    Baseline Limited exercise at home    Time 8    Period Weeks    Status On-going    Target Date 03/05/21      PT LONG TERM GOAL #2   Title Hamstring length from 90/90 will improve to >50deg to reduce difficulty bending.    Baseline R=40  L=33    Time 8    Period Weeks    Status On-going    Target Date 03/05/21      PT LONG TERM GOAL #3   Title FOTO score will increase to 58% as predicted for improved perception of functional abilities.    Baseline 47%  Time 8    Period Weeks    Status On-going    Target Date 03/05/21      PT LONG TERM GOAL #4   Title Berg Balance improved to >45/56 to indicate lower fall risk.    Baseline 25/56    Time 8    Period Weeks    Status On-going    Target Date 03/05/21      PT LONG TERM GOAL #5   Title Patient will report sleeping through the night awakening no more than 1x secondary pain.    Baseline Multiple times awakening during the night with  radicular pain in R LE.    Time 8    Period Weeks    Status On-going    Target Date 03/05/21                   Plan - 01/06/21 1829     Clinical Impression Statement Patient continues to be challegend by decresed balance, declines use of AD. Also difficulty with bed mobility, educated on log roll technique for supine to sit.  Patient also give T band to perform ankle DF/PF without AFOs on. FOTO score taken today was decreased from IE, patient had taken time away from PT treatments.  Patient would benefit from skilled PT to address deficits including trunk/LE mobility/flexibility, functional mobility, balance and gait to improve safety with daily tasks, reduced risk of falls and decreased radicular symptoms.    Comorbidities Asthma, morbid obesity, periferal neuropathy, sleep apnia on CPAP    Examination-Activity Limitations Bathing;Bed Mobility;Bend;Lift;Locomotion Level;Reach Overhead;Sleep;Squat;Stairs;Transfers    Examination-Participation Restrictions Cleaning;Community Activity;Laundry;Meal Prep    PT Treatment/Interventions ADLs/Self Care Home Management;Aquatic Therapy;Cryotherapy;Moist Heat;Ultrasound;Gait training;Stair training;Functional mobility training;Therapeutic activities;Therapeutic exercise;Balance training;Neuromuscular re-education;Patient/family education;Manual techniques;Passive range of motion;Energy conservation;Taping    PT Next Visit Plan Assess affectiveness of HEP, further assess balance/endurance, consider performing TUG, reinforce log roll, Core/hip stability/flexibility.    PT Home Exercise Plan Access Code: [email or web address]    Consulted and Agree with Plan of Care Patient             Patient will benefit from skilled therapeutic intervention in order to improve the following deficits and impairments:  Abnormal gait, Decreased balance, Decreased endurance, Decreased mobility, Difficulty walking, Impaired sensation, Decreased range of motion, Impaired  perceived functional ability, Improper body mechanics, Decreased strength, Decreased activity tolerance, Pain, Obesity  Visit Diagnosis: Low back pain radiating to right lower extremity  Muscle weakness (generalized)  Other abnormalities of gait and mobility  Difficulty in walking, not elsewhere classified     Problem List Patient Active Problem List   Diagnosis Date Noted   Leg edema, left 03/03/2020   Bilateral swelling of feet and ankles 12/18/2018   Acute right ankle pain 09/18/2018   Foot drop, bilateral 01/25/2018   Morbid obesity with body mass index of 40.0-44.9 in adult [facility]) 12/04/2017   Genetic testing 03/03/2016   Polyposis syndrome gastric and colonic - attenuated 12/09/2015   Pre-operative clearance 11/13/2015   Essential hypertension 11/13/2015   NS (nuclear sclerosis) 03/11/2015   Degenerative arthritis of lumbar spine 09/03/2014   Lumbar and sacral osteoarthritis 09/03/2014   Ocular rosacea 08/19/2014   Incomplete rotator cuff tear 07/22/2014   Conjunctival chalasis 02/12/2014   Dry eye syndrome 02/12/2014   Dry eye 02/12/2014   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   Disease of eyelid 02/12/2014   SPL (spondylolisthesis) 11/27/2013   Family history of gastric cancer 05/16/2013   Family history of cancer of digestive organ  05/16/2013   Imbalance 06/14/2012   Peripheral neuropathic pain 06/14/2012   Neuropathy 06/14/2012   Glaucoma suspect 11/03/2011   Cataract, nuclear 11/03/2011   Posterior vitreous detachment 11/03/2011   Hole, retinal 11/03/2011   Ache in joint 05/26/2011   Gastro-esophageal reflux disease without esophagitis 05/26/2011   Acid reflux 05/26/2011   Hypercholesterolemia 05/26/2011   Hyperlipidemia 05/27/2009   Morbid obesity ([place]) 05/27/2009   GERD 09/11/2008   History of colonic polyps 09/11/2008   Multiple gastric polyps 09/11/2008   H/O disease 09/11/2008   HOARSENESS 09/10/2008   SLEEP APNEA 11/05/2007   ASTHMA 10/12/2007    Airway hyperreactivity 10/12/2007   GASTRITIS, CHRONIC 08/01/2006   AG (atrophic gastritis) 08/01/2006    Pollyann Samples, PT 01/06/2021, 6:35 PM  [facility] [address] [place], [place], [address] Phone: [phone]   Fax:  [phone]  Name: NIKOLAJ GERAGHTY MRN: [id number] Date of Birth: 1940/09/01

## 2021-01-11 ENCOUNTER — Ambulatory Visit: Payer: Medicare PPO | Admitting: Physical Therapy

## 2021-01-11 ENCOUNTER — Other Ambulatory Visit: Payer: Self-pay

## 2021-01-11 ENCOUNTER — Encounter: Payer: Self-pay | Admitting: Physical Therapy

## 2021-01-11 DIAGNOSIS — R2689 Other abnormalities of gait and mobility: Secondary | ICD-10-CM

## 2021-01-11 DIAGNOSIS — M6281 Muscle weakness (generalized): Secondary | ICD-10-CM

## 2021-01-11 DIAGNOSIS — M79604 Pain in right leg: Secondary | ICD-10-CM

## 2021-01-11 DIAGNOSIS — R262 Difficulty in walking, not elsewhere classified: Secondary | ICD-10-CM

## 2021-01-11 DIAGNOSIS — M545 Low back pain, unspecified: Secondary | ICD-10-CM

## 2021-01-11 NOTE — Therapy (Signed)
[place], [place], [address] Phone: [phone]   Fax:  [phone]  Physical Therapy Treatment  Patient Details  Name: Albert Barr MRN: [id number] Date of Birth: 23-Feb-1941 Referring Provider (PT): Clayborne Dana   Encounter Date: 01/11/2021   PT End of Session - 01/11/21 1409     Visit Number 4    Number of Visits 17    Date for PT Re-Evaluation 03/05/21    Authorization Type [employer] MCR/[employer]    Progress Note Due on Visit 10    PT Start Time 1402    PT Stop Time 7062    PT Time Calculation (min) 43 min    Activity Tolerance Patient tolerated treatment well;No increased pain    Behavior During Therapy WFL for tasks assessed/performed             Past Medical History:  Diagnosis Date   Allergy    Anal fissure    Arthritis    shoulder, knees   Asthma    Atrophic gastritis without mention of hemorrhage    Cataract    bilateraly   Esophageal reflux    Family history of gastric cancer    Fibula fracture    hair line fracture   Gastric polyps    history   Hiatal hernia    Hx of adenomatous colonic polyps    Hyperlipidemia    Hypertension    Metaplasia of esophagus 2011   "gastric metaplasia"   Morbid obesity ([place])    Obstructive sleep apnea on CPAP    Other voice and resonance disorders    Peripheral neuropathy    Polyposis syndrome gastric and colonic - attenuated 12/09/2015   Sleep apnea    uses CPAP   Squamous cell skin cancer 04/2018   lesion left forearm   Unspecified asthma(493.90)     Past Surgical History:  Procedure Laterality Date   APPENDECTOMY     CHOLECYSTECTOMY     COLONOSCOPY  08/20/2009 (multiple)   Tubular adenoma   ESOPHAGOGASTRODUODENOSCOPY  08/20/09 (multiple)   [place]  2007   KNEE SURGERY     arthroscopy- both knees   POLYPECTOMY     UPPER GASTROINTESTINAL ENDOSCOPY      There were no vitals filed for this  visit.   Subjective Assessment - 01/11/21 1402     Subjective Patient reports good compliance with HEP. Hasn't had any more cramps. L side post dull pain is new since starting exercises.    Pertinent History B neuropathy with B AFOs, [place], skin CA, sleep apnea with CPAP, morbid obesity, HLD, OA, R fib fx (2020) after fall on concrete.    Currently in Pain? Yes    Pain Score 0-No pain                [facility] PT Assessment - 01/11/21 0001       Assessment   Medical Diagnosis LBP with rad M54.16    Referring Provider (PT) Clayborne Dana    Next MD Visit July      Precautions   Precautions None                           [facility] Adult PT Treatment/Exercise - 01/11/21 0001       Bed Mobility   Bed Mobility Supine to Sit    Sit to Supine Supervision/Verbal cueing      Transfers  Transfers Sit to Stand      Ambulation/Gait   Gait Comments L/R lateral stepping 4x10' ea dir      Lumbar Exercises: Standing   Functional Squats 10 reps      Lumbar Exercises: Supine   Dead Bug 5 seconds;5 reps   static   Bridge 10 reps    Bridge with [employer] 10 reps    Bridge with [employer] Limitations 2sets    Bridge with clamshell 10 reps    Bridge with [employer] Limitations 2sets    Straight Leg Raise 15 reps    Straight Leg Raises Limitations 2 sets      Lumbar Exercises: Sidelying   Clam 10 reps    Clam Limitations BLUE    Hip Abduction 10 reps    Hip Abduction Limitations ITB massage for neutral rotation      Knee/Hip Exercises: Standing   Lateral Step Up Right;Left    Functional Squat 10 reps    Functional Squat Limitations UE support 2sets      Manual Therapy   Manual Therapy Soft tissue mobilization;Passive ROM    Soft tissue mobilization STM ITB and piriformis R/L for tissue mobility    Passive ROM SKTC, hip IR for improved flexibility before dead bug                      PT Short Term Goals - 01/04/21 1503       PT SHORT TERM  GOAL #1   Title Patient will be independent with initial HEP for symptom management.    Time 3    Period Weeks    Status On-going    Target Date 01/29/21      PT SHORT TERM GOAL #2   Title Patient to be instructed on proper body mechanics to reduce pain during functional tasks.    Baseline Limited knowledge    Time 2    Period Weeks    Status On-going    Target Date 01/22/21      PT SHORT TERM GOAL #3   Title BERG balance performed for baseline and LTG updated to indicate balance progress.    Baseline 25/56    Status Achieved               PT Long Term Goals - 01/04/21 1506       PT LONG TERM GOAL #1   Title Patient will be independent with final HEP and progression to continue to reduce pain and symptoms after discharge.    Baseline Limited exercise at home    Time 8    Period Weeks    Status On-going    Target Date 03/05/21      PT LONG TERM GOAL #2   Title Hamstring length from 90/90 will improve to >50deg to reduce difficulty bending.    Baseline R=40  L=33    Time 8    Period Weeks    Status On-going    Target Date 03/05/21      PT LONG TERM GOAL #3   Title FOTO score will increase to 58% as predicted for improved perception of functional abilities.    Baseline 47%    Time 8    Period Weeks    Status On-going    Target Date 03/05/21      PT LONG TERM GOAL #4   Title Berg Balance improved to >45/56 to indicate lower fall risk.    Baseline 25/56  Time 8    Period Weeks    Status On-going    Target Date 03/05/21      PT LONG TERM GOAL #5   Title Patient will report sleeping through the night awakening no more than 1x secondary pain.    Baseline Multiple times awakening during the night with radicular pain in R LE.    Time 8    Period Weeks    Status On-going    Target Date 03/05/21                   Plan - 01/11/21 1556     Clinical Impression Statement Patient improving some with bed mobility requiring cues for log roll and use  of UEs instead of momentum. Patient improvement with core strengthening able to perform short bouts of static dead bug with modifications. Patient benefited from passive range of B hips, difficulty finding positions for sefl stretches. Patient continues to be challenged by decreased LE and core strength as well as decrased balance, declines use of AD.  Patient will benefit from skilled PT to address deficits including trunk/LE mobility/flexibility, functional mobility, balance and gait to improve safety with daily tasks, reduced risk of falls and decreased radicular symptoms.    Comorbidities Asthma, morbid obesity, periferal neuropathy, sleep apnia on CPAP    Examination-Activity Limitations Bathing;Bed Mobility;Bend;Lift;Locomotion Level;Reach Overhead;Sleep;Squat;Stairs;Transfers    Examination-Participation Restrictions Cleaning;Community Activity;Laundry;Meal Prep    PT Treatment/Interventions ADLs/Self Care Home Management;Aquatic Therapy;Cryotherapy;Moist Heat;Ultrasound;Gait training;Stair training;Functional mobility training;Therapeutic activities;Therapeutic exercise;Balance training;Neuromuscular re-education;Patient/family education;Manual techniques;Passive range of motion;Energy conservation;Taping    PT Next Visit Plan Assess affectiveness of HEP, further assess balance/endurance, consider performing TUG, reinforce log roll, Core/hip stability/flexibility.    PT Home Exercise Plan Access Code: [email or web address]    Consulted and Agree with Plan of Care Patient             Patient will benefit from skilled therapeutic intervention in order to improve the following deficits and impairments:  Abnormal gait, Decreased balance, Decreased endurance, Decreased mobility, Difficulty walking, Impaired sensation, Decreased range of motion, Impaired perceived functional ability, Improper body mechanics, Decreased strength, Decreased activity tolerance, Pain, Obesity  Visit Diagnosis: Low back pain  radiating to right lower extremity  Muscle weakness (generalized)  Other abnormalities of gait and mobility  Difficulty in walking, not elsewhere classified     Problem List Patient Active Problem List   Diagnosis Date Noted   Leg edema, left 03/03/2020   Bilateral swelling of feet and ankles 12/18/2018   Acute right ankle pain 09/18/2018   Foot drop, bilateral 01/25/2018   Morbid obesity with body mass index of 40.0-44.9 in adult [facility]) 12/04/2017   Genetic testing 03/03/2016   Polyposis syndrome gastric and colonic - attenuated 12/09/2015   Pre-operative clearance 11/13/2015   Essential hypertension 11/13/2015   NS (nuclear sclerosis) 03/11/2015   Degenerative arthritis of lumbar spine 09/03/2014   Lumbar and sacral osteoarthritis 09/03/2014   Ocular rosacea 08/19/2014   Incomplete rotator cuff tear 07/22/2014   Conjunctival chalasis 02/12/2014   Dry eye syndrome 02/12/2014   Dry eye 02/12/2014   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   Disease of eyelid 02/12/2014   SPL (spondylolisthesis) 11/27/2013   Family history of gastric cancer 05/16/2013   Family history of cancer of digestive organ 05/16/2013   Imbalance 06/14/2012   Peripheral neuropathic pain 06/14/2012   Neuropathy 06/14/2012   Glaucoma suspect 11/03/2011   Cataract, nuclear 11/03/2011   Posterior vitreous detachment 11/03/2011   Hole, retinal 11/03/2011  Ache in joint 05/26/2011   Gastro-esophageal reflux disease without esophagitis 05/26/2011   Acid reflux 05/26/2011   Hypercholesterolemia 05/26/2011   Hyperlipidemia 05/27/2009   Morbid obesity ([place]) 05/27/2009   GERD 09/11/2008   History of colonic polyps 09/11/2008   Multiple gastric polyps 09/11/2008   H/O disease 09/11/2008   HOARSENESS 09/10/2008   SLEEP APNEA 11/05/2007   ASTHMA 10/12/2007   Airway hyperreactivity 10/12/2007   GASTRITIS, CHRONIC 08/01/2006   AG (atrophic gastritis) 08/01/2006    Pollyann Samples, PT 01/11/2021, 4:05  PM  [facility] [address] [place], [place], [address] Phone: [phone]   Fax:  [phone]  Name: Albert Barr MRN: [id number] Date of Birth: [birth year]

## 2021-01-13 ENCOUNTER — Other Ambulatory Visit: Payer: Self-pay

## 2021-01-13 ENCOUNTER — Encounter: Payer: Self-pay | Admitting: Physical Therapy

## 2021-01-13 ENCOUNTER — Ambulatory Visit: Payer: Medicare PPO | Admitting: Physical Therapy

## 2021-01-13 DIAGNOSIS — M545 Low back pain, unspecified: Secondary | ICD-10-CM | POA: Diagnosis not present

## 2021-01-13 DIAGNOSIS — M79604 Pain in right leg: Secondary | ICD-10-CM

## 2021-01-13 DIAGNOSIS — R2689 Other abnormalities of gait and mobility: Secondary | ICD-10-CM

## 2021-01-13 DIAGNOSIS — M6281 Muscle weakness (generalized): Secondary | ICD-10-CM

## 2021-01-13 DIAGNOSIS — R262 Difficulty in walking, not elsewhere classified: Secondary | ICD-10-CM

## 2021-01-13 NOTE — Therapy (Signed)
[place], [place], [address] Phone: [phone]   Fax:  [phone]  Physical Therapy Treatment  Patient Details  Name: Albert Barr MRN: [id number] Date of Birth: 12/26/1940 Referring Provider (PT): Clayborne Dana   Encounter Date: 01/13/2021   PT End of Session - 01/13/21 1622     Visit Number 5    Number of Visits 17    Date for PT Re-Evaluation 03/05/21    Authorization Type [employer] MCR/Tricare    Progress Note Due on Visit 10    PT Start Time 2505    PT Stop Time 1445    PT Time Calculation (min) 40 min    Activity Tolerance Patient tolerated treatment well;No increased pain    Behavior During Therapy WFL for tasks assessed/performed             Past Medical History:  Diagnosis Date   Allergy    Anal fissure    Arthritis    shoulder, knees   Asthma    Atrophic gastritis without mention of hemorrhage    Cataract    bilateraly   Esophageal reflux    Family history of gastric cancer    Fibula fracture    hair line fracture   Gastric polyps    history   Hiatal hernia    Hx of adenomatous colonic polyps    Hyperlipidemia    Hypertension    Metaplasia of esophagus 2011   "gastric metaplasia"   Morbid obesity ([place])    Obstructive sleep apnea on CPAP    Other voice and resonance disorders    Peripheral neuropathy    Polyposis syndrome gastric and colonic - attenuated 12/09/2015   Sleep apnea    uses CPAP   Squamous cell skin cancer 04/2018   lesion left forearm   Unspecified asthma(493.90)     Past Surgical History:  Procedure Laterality Date   APPENDECTOMY     CHOLECYSTECTOMY     COLONOSCOPY  08/20/2009 (multiple)   Tubular adenoma   ESOPHAGOGASTRODUODENOSCOPY  08/20/09 (multiple)   [place]  2007   KNEE SURGERY     arthroscopy- both knees   POLYPECTOMY     UPPER GASTROINTESTINAL ENDOSCOPY      There were no vitals filed for this  visit.   Subjective Assessment - 01/13/21 1405     Subjective Patient reports good compliance with HEP yesterday. Patient also states he attempted to work out at [employer], but the [facility] was out.    Pertinent History B neuropathy with B AFOs, Ashma, skin CA, sleep apnea with CPAP, morbid obesity, HLD, OA, R fib fx (2020) after fall on concrete.    Patient Stated Goals Get the nerve pain to allow sleep.    Currently in Pain? Yes    Pain Score 0-No pain   Normal morning leg pain and L post pelvic pain during exercises.   Pain Location Leg                [facility] PT Assessment - 01/13/21 0001       Assessment   Medical Diagnosis LBP with rad M54.16    Referring Provider (PT) Erik Obey, Adam      Precautions   Precautions None                           [facility] Adult PT Treatment/Exercise - 01/13/21 0001  Neuro Re-ed    Neuro Re-ed Details  Parallel bars:Tandem stance, tandem walking, stand on AIREX, squat on AIREX, side stepping then with Yellow band.      Lumbar Exercises: Stretches   Piriformis Stretch 20 seconds;Right;Left    Piriformis Stretch Limitations side sit on mat      Lumbar Exercises: Standing   Functional Squats 10 reps    Functional Squats Limitations Light UE support      Knee/Hip Exercises: Standing   Knee Flexion 10 reps;2 sets    Hip Flexion 10 reps;2 sets    Hip Abduction 10 reps;2 sets      Manual Therapy   Manual Therapy Soft tissue mobilization;Passive ROM    Soft tissue mobilization STM ITB and piriformis R/L for tissue mobility    Passive ROM SKTC, hip IR for improved flexibility before dead bug                    PT Education - 01/13/21 1621     Education Details Continue current HEP, patient demoed log roll technique stating it is easier. Discussed walking program when patient returns to use of [employer], to perform several bouts of walking and resting with gradual increase in walking rather than walking to exhausion before  resting.    Person(s) Educated Patient    Methods Explanation;Demonstration    Comprehension Verbalized understanding;Returned demonstration              PT Short Term Goals - 01/04/21 1503       PT SHORT TERM GOAL #1   Title Patient will be independent with initial HEP for symptom management.    Time 3    Period Weeks    Status On-going    Target Date 01/29/21      PT SHORT TERM GOAL #2   Title Patient to be instructed on proper body mechanics to reduce pain during functional tasks.    Baseline Limited knowledge    Time 2    Period Weeks    Status On-going    Target Date 01/22/21      PT SHORT TERM GOAL #3   Title BERG balance performed for baseline and LTG updated to indicate balance progress.    Baseline 25/56    Status Achieved               PT Long Term Goals - 01/04/21 1506       PT LONG TERM GOAL #1   Title Patient will be independent with final HEP and progression to continue to reduce pain and symptoms after discharge.    Baseline Limited exercise at home    Time 8    Period Weeks    Status On-going    Target Date 03/05/21      PT LONG TERM GOAL #2   Title Hamstring length from 90/90 will improve to >50deg to reduce difficulty bending.    Baseline R=40  L=33    Time 8    Period Weeks    Status On-going    Target Date 03/05/21      PT LONG TERM GOAL #3   Title FOTO score will increase to 58% as predicted for improved perception of functional abilities.    Baseline 47%    Time 8    Period Weeks    Status On-going    Target Date 03/05/21      PT LONG TERM GOAL #4   Title Berg Balance improved to >45/56 to indicate  lower fall risk.    Baseline 25/56    Time 8    Period Weeks    Status On-going    Target Date 03/05/21      PT LONG TERM GOAL #5   Title Patient will report sleeping through the night awakening no more than 1x secondary pain.    Baseline Multiple times awakening during the night with radicular pain in R LE.    Time 8     Period Weeks    Status On-going    Target Date 03/05/21                   Plan - 01/13/21 1623     Clinical Impression Statement Patient demoed log roll, now trying to remember to use at home, states it is easier.  Focus of today's treatment on Neuromuscular re-ed balance activities in parallel bars, continues to be challenged by narrow BOS, tandem walking, static and dynamic balance using compliant surface.  Patient with slow progress with LE strength, continues to be challenged by decreased hip ROM and tight hamstrings.  Patient will benefit from continued skilled PT to address deficits including trunk/LE mobility/flexibility, functional mobility, balance and gait to improve safety with daily tasks, reduced risk of falls and decreased radicular symptoms into R LE.    Comorbidities Asthma, morbid obesity, periferal neuropathy, sleep apnia on CPAP    Examination-Activity Limitations Bathing;Bed Mobility;Bend;Lift;Locomotion Level;Reach Overhead;Sleep;Squat;Stairs;Transfers    PT Treatment/Interventions ADLs/Self Care Home Management;Aquatic Therapy;Cryotherapy;Moist Heat;Ultrasound;Gait training;Stair training;Functional mobility training;Therapeutic activities;Therapeutic exercise;Balance training;Neuromuscular re-education;Patient/family education;Manual techniques;Passive range of motion;Energy conservation;Taping    PT Next Visit Plan Assess affectiveness of HEP, further assess balance/endurance, consider performing TUG, reinforce log roll, Core/hip stability/flexibility.    PT Home Exercise Plan Access Code: [email or web address]    Consulted and Agree with Plan of Care Patient             Patient will benefit from skilled therapeutic intervention in order to improve the following deficits and impairments:  Abnormal gait, Decreased balance, Decreased endurance, Decreased mobility, Difficulty walking, Impaired sensation, Decreased range of motion, Impaired perceived functional ability, Improper  body mechanics, Decreased strength, Decreased activity tolerance, Pain, Obesity  Visit Diagnosis: Low back pain radiating to right lower extremity  Muscle weakness (generalized)  Other abnormalities of gait and mobility  Difficulty in walking, not elsewhere classified     Problem List Patient Active Problem List   Diagnosis Date Noted   Leg edema, left 03/03/2020   Bilateral swelling of feet and ankles 12/18/2018   Acute right ankle pain 09/18/2018   Foot drop, bilateral 01/25/2018   Morbid obesity with body mass index of 40.0-44.9 in adult [facility]) 12/04/2017   Genetic testing 03/03/2016   Polyposis syndrome gastric and colonic - attenuated 12/09/2015   Pre-operative clearance 11/13/2015   Essential hypertension 11/13/2015   NS (nuclear sclerosis) 03/11/2015   Degenerative arthritis of lumbar spine 09/03/2014   Lumbar and sacral osteoarthritis 09/03/2014   Ocular rosacea 08/19/2014   Incomplete rotator cuff tear 07/22/2014   Conjunctival chalasis 02/12/2014   Dry eye syndrome 02/12/2014   Dry eye 02/12/2014   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   Disease of eyelid 02/12/2014   SPL (spondylolisthesis) 11/27/2013   Family history of gastric cancer 05/16/2013   Family history of cancer of digestive organ 05/16/2013   Imbalance 06/14/2012   Peripheral neuropathic pain 06/14/2012   Neuropathy 06/14/2012   Glaucoma suspect 11/03/2011   Cataract, nuclear 11/03/2011   Posterior vitreous detachment 11/03/2011  Hole, retinal 11/03/2011   Ache in joint 05/26/2011   Gastro-esophageal reflux disease without esophagitis 05/26/2011   Acid reflux 05/26/2011   Hypercholesterolemia 05/26/2011   Hyperlipidemia 05/27/2009   Morbid obesity ([place]) 05/27/2009   GERD 09/11/2008   History of colonic polyps 09/11/2008   Multiple gastric polyps 09/11/2008   H/O disease 09/11/2008   HOARSENESS 09/10/2008   SLEEP APNEA 11/05/2007   ASTHMA 10/12/2007   Airway hyperreactivity 10/12/2007    GASTRITIS, CHRONIC 08/01/2006   AG (atrophic gastritis) 08/01/2006    Pollyann Samples, PT 01/13/2021, 4:30 PM  [facility] [address] [place], [place], [address] Phone: [phone]   Fax:  [phone]  Name: Albert Barr MRN: [id number] Date of Birth: Nov 04, 1940

## 2021-01-18 ENCOUNTER — Other Ambulatory Visit: Payer: Self-pay

## 2021-01-18 ENCOUNTER — Ambulatory Visit: Payer: Medicare PPO | Admitting: Physical Therapy

## 2021-01-18 ENCOUNTER — Encounter: Payer: Self-pay | Admitting: Physical Therapy

## 2021-01-18 DIAGNOSIS — M6281 Muscle weakness (generalized): Secondary | ICD-10-CM

## 2021-01-18 DIAGNOSIS — M545 Low back pain, unspecified: Secondary | ICD-10-CM | POA: Diagnosis not present

## 2021-01-18 DIAGNOSIS — R262 Difficulty in walking, not elsewhere classified: Secondary | ICD-10-CM

## 2021-01-18 DIAGNOSIS — R2689 Other abnormalities of gait and mobility: Secondary | ICD-10-CM

## 2021-01-18 DIAGNOSIS — M79604 Pain in right leg: Secondary | ICD-10-CM

## 2021-01-18 NOTE — Therapy (Signed)
[place], [place], [address] Phone: [phone]   Fax:  [phone]  Physical Therapy Treatment  Patient Details  Name: Albert Barr MRN: [id number] Date of Birth: 1940/12/29 Referring Provider (PT): Clayborne Dana   Encounter Date: 01/18/2021   PT End of Session - 01/18/21 1441     Visit Number 6    Number of Visits 17    Date for PT Re-Evaluation 03/05/21    Authorization Type [employer] MCR/Tric[employer] (FOTO done at reeval 7/13 visit 3), FOTO at v9 and v13.    Progress Note Due on Visit 13    PT Start Time 1403    PT Stop Time 1453    PT Time Calculation (min) 50 min    Activity Tolerance Patient tolerated treatment well;No increased pain    Behavior During Therapy WFL for tasks assessed/performed             Past Medical History:  Diagnosis Date   Allergy    Anal fissure    Arthritis    shoulder, knees   Asthma    Atrophic gastritis without mention of hemorrhage    Cataract    bilateraly   Esophageal reflux    Family history of gastric cancer    Fibula fracture    hair line fracture   Gastric polyps    history   Hiatal hernia    Hx of adenomatous colonic polyps    Hyperlipidemia    Hypertension    Metaplasia of esophagus 2011   "gastric metaplasia"   Morbid obesity ([place])    Obstructive sleep apnea on CPAP    Other voice and resonance disorders    Peripheral neuropathy    Polyposis syndrome gastric and colonic - attenuated 12/09/2015   Sleep apnea    uses CPAP   Squamous cell skin cancer 04/2018   lesion left forearm   Unspecified asthma(493.90)     Past Surgical History:  Procedure Laterality Date   APPENDECTOMY     CHOLECYSTECTOMY     COLONOSCOPY  08/20/2009 (multiple)   Tubular adenoma   ESOPHAGOGASTRODUODENOSCOPY  08/20/09 (multiple)   [place]  2007   KNEE SURGERY     arthroscopy- both knees   POLYPECTOMY     UPPER GASTROINTESTINAL  ENDOSCOPY      There were no vitals filed for this visit.   Subjective Assessment - 01/18/21 1400     Subjective Patient reports good compliance with HEP.  Patient reports less R lateral thigh/hip while performing HEP, [facility].    Pertinent History B neuropathy with B AFOs, Ashma, skin CA, sleep apnea with CPAP, morbid obesity, HLD, OA, R fib fx (2020) after fall on concrete.    Patient Stated Goals Get the nerve pain to allow sleep.    Currently in Pain? Yes    Pain Score 0-No pain                [facility] PT Assessment - 01/18/21 0001       Assessment   Medical Diagnosis LBP with rad M54.16    Referring Provider (PT) Erik Obey, Adam      Precautions   Precautions None                           [facility] Adult PT Treatment/Exercise - 01/18/21 0001       Timed Up and Go Test  TUG Normal TUG    Normal TUG (seconds) 20   23,18,19sec     Lumbar Exercises: Supine   Dead Bug 5 seconds;5 reps   Static   Bridge 10 reps    Bridge with [employer] 10 reps    Bridge with clamshell 10 reps      Manual Therapy   Manual Therapy Soft tissue mobilization;Passive ROM;Joint mobilization    Joint Mobilization L axial distraction    Soft tissue mobilization STM ITB and piriformis R/L for tissue mobility    Passive ROM SKTC, hip IR for improved flexibility before dead bug                    PT Education - 01/18/21 1459     Education Details Addition of tandem stance at home.    Person(s) Educated Patient    Methods Explanation;Demonstration;Verbal cues;Handout    Comprehension Verbalized understanding;Returned demonstration              PT Short Term Goals - 01/04/21 1503       PT SHORT TERM GOAL #1   Title Patient will be independent with initial HEP for symptom management.    Time 3    Period Weeks    Status On-going    Target Date 01/29/21      PT SHORT TERM GOAL #2   Title Patient to be instructed on proper body  mechanics to reduce pain during functional tasks.    Baseline Limited knowledge    Time 2    Period Weeks    Status On-going    Target Date 01/22/21      PT SHORT TERM GOAL #3   Title BERG balance performed for baseline and LTG updated to indicate balance progress.    Baseline 25/56    Status Achieved               PT Long Term Goals - 01/04/21 1506       PT LONG TERM GOAL #1   Title Patient will be independent with final HEP and progression to continue to reduce pain and symptoms after discharge.    Baseline Limited exercise at home    Time 8    Period Weeks    Status On-going    Target Date 03/05/21      PT LONG TERM GOAL #2   Title Hamstring length from 90/90 will improve to >50deg to reduce difficulty bending.    Baseline R=40  L=33    Time 8    Period Weeks    Status On-going    Target Date 03/05/21      PT LONG TERM GOAL #3   Title FOTO score will increase to 58% as predicted for improved perception of functional abilities.    Baseline 47%    Time 8    Period Weeks    Status On-going    Target Date 03/05/21      PT LONG TERM GOAL #4   Title Berg Balance improved to >45/56 to indicate lower fall risk.    Baseline 25/56    Time 8    Period Weeks    Status On-going    Target Date 03/05/21      PT LONG TERM GOAL #5   Title Patient will report sleeping through the night awakening no more than 1x secondary pain.    Baseline Multiple times awakening during the night with radicular pain in R LE.    Time 8  Period Weeks    Status On-going    Target Date 03/05/21                   Plan - 01/18/21 1427     Clinical Impression Statement Focus of today's treatment on core and LE strength, less on balance but continues to be challenged by narrow BOS. Patient just started using foam at home for static and dynamic balance. Patient with slow progress with LE strength, continues to be challenged by decreased hip ROM and tight hamstrings. New Left hip  discomfort improving.  Patient will benefit from continued skilled PT to address deficits including trunk/LE mobility/flexibility, functional mobility, balance and gait to improve safety with daily tasks, reduced risk of falls and decreased radicular symptoms into R LE.    Comorbidities Asthma, morbid obesity, periferal neuropathy, sleep apnia on CPAP    Examination-Activity Limitations Bathing;Bed Mobility;Bend;Lift;Locomotion Level;Reach Overhead;Sleep;Squat;Stairs;Transfers    Examination-Participation Restrictions Cleaning;Community Activity;Laundry;Meal Prep    PT Treatment/Interventions ADLs/Self Care Home Management;Aquatic Therapy;Cryotherapy;Moist Heat;Ultrasound;Gait training;Stair training;Functional mobility training;Therapeutic activities;Therapeutic exercise;Balance training;Neuromuscular re-education;Patient/family education;Manual techniques;Passive range of motion;Energy conservation;Taping    PT Next Visit Plan Assess affectiveness of HEP, further assess balance/endurance, consider performing TUG and sit<>stand as treatment, reinforce log roll, Core/hip stability/flexibility.    PT Home Exercise Plan Access Code: [email or web address]    Consulted and Agree with Plan of Care Patient             Patient will benefit from skilled therapeutic intervention in order to improve the following deficits and impairments:  Abnormal gait, Decreased balance, Decreased endurance, Decreased mobility, Difficulty walking, Impaired sensation, Decreased range of motion, Impaired perceived functional ability, Improper body mechanics, Decreased strength, Decreased activity tolerance, Pain, Obesity  Visit Diagnosis: Low back pain radiating to right lower extremity  Muscle weakness (generalized)  Other abnormalities of gait and mobility  Difficulty in walking, not elsewhere classified     Problem List Patient Active Problem List   Diagnosis Date Noted   Leg edema, left 03/03/2020   Bilateral swelling  of feet and ankles 12/18/2018   Acute right ankle pain 09/18/2018   Foot drop, bilateral 01/25/2018   Morbid obesity with body mass index of 40.0-44.9 in adult [facility]) 12/04/2017   Genetic testing 03/03/2016   Polyposis syndrome gastric and colonic - attenuated 12/09/2015   Pre-operative clearance 11/13/2015   Essential hypertension 11/13/2015   NS (nuclear sclerosis) 03/11/2015   Degenerative arthritis of lumbar spine 09/03/2014   Lumbar and sacral osteoarthritis 09/03/2014   Ocular rosacea 08/19/2014   Incomplete rotator cuff tear 07/22/2014   Conjunctival chalasis 02/12/2014   Dry eye syndrome 02/12/2014   Dry eye 02/12/2014   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   Disease of eyelid 02/12/2014   SPL (spondylolisthesis) 11/27/2013   Family history of gastric cancer 05/16/2013   Family history of cancer of digestive organ 05/16/2013   Imbalance 06/14/2012   Peripheral neuropathic pain 06/14/2012   Neuropathy 06/14/2012   Glaucoma suspect 11/03/2011   Cataract, nuclear 11/03/2011   Posterior vitreous detachment 11/03/2011   Hole, retinal 11/03/2011   Ache in joint 05/26/2011   Gastro-esophageal reflux disease without esophagitis 05/26/2011   Acid reflux 05/26/2011   Hypercholesterolemia 05/26/2011   Hyperlipidemia 05/27/2009   Morbid obesity ([place]) 05/27/2009   GERD 09/11/2008   History of colonic polyps 09/11/2008   Multiple gastric polyps 09/11/2008   H/O disease 09/11/2008   HOARSENESS 09/10/2008   SLEEP APNEA 11/05/2007   ASTHMA 10/12/2007   Airway hyperreactivity 10/12/2007  GASTRITIS, CHRONIC 08/01/2006   AG (atrophic gastritis) 08/01/2006    Pollyann Samples, PT 01/18/2021, 3:05 PM  [facility] [address] [place], [place], [address] Phone: [phone]   Fax:  [phone]  Name: Albert Barr MRN: [id number] Date of Birth: 07-23-1940

## 2021-01-20 ENCOUNTER — Ambulatory Visit: Payer: Medicare PPO | Admitting: Physical Therapy

## 2021-01-20 ENCOUNTER — Other Ambulatory Visit: Payer: Self-pay

## 2021-01-20 ENCOUNTER — Encounter: Payer: Self-pay | Admitting: Physical Therapy

## 2021-01-20 DIAGNOSIS — M6281 Muscle weakness (generalized): Secondary | ICD-10-CM

## 2021-01-20 DIAGNOSIS — M545 Low back pain, unspecified: Secondary | ICD-10-CM

## 2021-01-20 DIAGNOSIS — R2689 Other abnormalities of gait and mobility: Secondary | ICD-10-CM

## 2021-01-20 DIAGNOSIS — M79604 Pain in right leg: Secondary | ICD-10-CM

## 2021-01-20 DIAGNOSIS — R262 Difficulty in walking, not elsewhere classified: Secondary | ICD-10-CM

## 2021-01-20 NOTE — Therapy (Signed)
[place], [place], [address] Phone: [phone]   Fax:  [phone]  Physical Therapy Treatment  Patient Details  Name: Albert Barr MRN: [id number] Date of Birth: 04/26/1941 Referring Provider (PT): Clayborne Dana   Encounter Date: 01/20/2021   PT End of Session - 01/20/21 1405     Visit Number 7    Number of Visits 17    Date for PT Re-Evaluation 03/05/21    Authorization Type [employer] M[employer] (FOTO done at reeval 7/13 visit 3), FOTO at v9 and v13.    PT Start Time 1405    PT Stop Time 1445    PT Time Calculation (min) 40 min    Activity Tolerance Patient tolerated treatment well;No increased pain    Behavior During Therapy WFL for tasks assessed/performed             Past Medical History:  Diagnosis Date   Allergy    Anal fissure    Arthritis    shoulder, knees   Asthma    Atrophic gastritis without mention of hemorrhage    Cataract    bilateraly   Esophageal reflux    Family history of gastric cancer    Fibula fracture    hair line fracture   Gastric polyps    history   Hiatal hernia    Hx of adenomatous colonic polyps    Hyperlipidemia    Hypertension    Metaplasia of esophagus 2011   "gastric metaplasia"   Morbid obesity ([place])    Obstructive sleep apnea on CPAP    Other voice and resonance disorders    Peripheral neuropathy    Polyposis syndrome gastric and colonic - attenuated 12/09/2015   Sleep apnea    uses CPAP   Squamous cell skin cancer 04/2018   lesion left forearm   Unspecified asthma(493.90)     Past Surgical History:  Procedure Laterality Date   APPENDECTOMY     CHOLECYSTECTOMY     COLONOSCOPY  08/20/2009 (multiple)   Tubular adenoma   ESOPHAGOGASTRODUODENOSCOPY  08/20/09 (multiple)   [place]  2007   KNEE SURGERY     arthroscopy- both knees   POLYPECTOMY     UPPER GASTROINTESTINAL ENDOSCOPY      There were no  vitals filed for this visit.   Subjective Assessment - 01/20/21 1406     Subjective Patients reports good compliance with the HEP, reports the problem on the L post hip region is deminishing. [facility] has been out and hasn't started the walking program yet.  Patient reports he is using elevation to improve "getting to sleep".    Pertinent History B neuropathy with B AFOs, Ashma, skin CA, sleep apnea with CPAP, morbid obesity, HLD, OA, R fib fx (2020) after fall on concrete.    Patient Stated Goals Get the nerve pain to allow sleep.    Currently in Pain? Yes    Pain Score 0-No pain    Pain Location Leg   Pain is usually first thing in the morning.   Pain Orientation Right    Pain Descriptors / Indicators Radiating    Pain Type Chronic pain                [facility] PT Assessment - 01/20/21 0001       Assessment   Medical Diagnosis LBP with rad M54.16    Referring Provider (PT) Erik Obey, Adam      Precautions  Precautions None                           [facility] Adult PT Treatment/Exercise - 01/20/21 0001       Neuro Re-ed    Neuro Re-ed Details  Parallel bars:Tandem stance, tandem walking, stand on AIREX, squat on AIREX, side stepping 4x12', then with Yellow band.      Lumbar Exercises: Standing   Functional Squats 10 reps    Functional Squats Limitations Light UE support - on AIREX      Lumbar Exercises: Supine   Bridge with [employer] 15 reps    Straight Leg Raise 15 reps    Straight Leg Raises Limitations 2 sets      Knee/Hip Exercises: Standing   Lateral Step Up Right;Left    Lateral Step Up Limitations AIREX with light UE support    Functional Squat 2 sets      Manual Therapy   Manual Therapy Soft tissue mobilization;Passive ROM;Joint mobilization    Joint Mobilization L axial distraction    Soft tissue mobilization STM ITB and piriformis R/L for tissue mobility    Passive ROM SKTC, hip IR for improved flexibility before dead bug                     PT Education - 01/20/21 1818     Education Details Addition of sidelying hip abd to HEP    Person(s) Educated Patient    Methods Demonstration;Explanation;Handout    Comprehension Verbalized understanding              PT Short Term Goals - 01/04/21 1503       PT SHORT TERM GOAL #1   Title Patient will be independent with initial HEP for symptom management.    Time 3    Period Weeks    Status On-going    Target Date 01/29/21      PT SHORT TERM GOAL #2   Title Patient to be instructed on proper body mechanics to reduce pain during functional tasks.    Baseline Limited knowledge    Time 2    Period Weeks    Status On-going    Target Date 01/22/21      PT SHORT TERM GOAL #3   Title BERG balance performed for baseline and LTG updated to indicate balance progress.    Baseline 25/56    Status Achieved               PT Long Term Goals - 01/04/21 1506       PT LONG TERM GOAL #1   Title Patient will be independent with final HEP and progression to continue to reduce pain and symptoms after discharge.    Baseline Limited exercise at home    Time 8    Period Weeks    Status On-going    Target Date 03/05/21      PT LONG TERM GOAL #2   Title Hamstring length from 90/90 will improve to >50deg to reduce difficulty bending.    Baseline R=40  L=33    Time 8    Period Weeks    Status On-going    Target Date 03/05/21      PT LONG TERM GOAL #3   Title FOTO score will increase to 58% as predicted for improved perception of functional abilities.    Baseline 47%    Time 8    Period Weeks  Status On-going    Target Date 03/05/21      PT LONG TERM GOAL #4   Title Berg Balance improved to >45/56 to indicate lower fall risk.    Baseline 25/56    Time 8    Period Weeks    Status On-going    Target Date 03/05/21      PT LONG TERM GOAL #5   Title Patient will report sleeping through the night awakening no more than 1x secondary pain.    Baseline  Multiple times awakening during the night with radicular pain in R LE.    Time 8    Period Weeks    Status On-going    Target Date 03/05/21                   Plan - 01/20/21 1714     Clinical Impression Statement Patient continues to progress slowly with strength and stability, L side pain has improved, continues to have morning R hip prox LE pain that improves with mobility. Patient continues to be challenged by decreased balance and decreased LE flexibility. Patient will benefit from continued skilled PT to address deificts and maximize safe functional mobility.    Comorbidities Asthma, morbid obesity, periferal neuropathy, sleep apnia on CPAP    Examination-Activity Limitations Bathing;Bed Mobility;Bend;Lift;Locomotion Level;Reach Overhead;Sleep;Squat;Stairs;Transfers    PT Treatment/Interventions ADLs/Self Care Home Management;Aquatic Therapy;Cryotherapy;Moist Heat;Ultrasound;Gait training;Stair training;Functional mobility training;Therapeutic activities;Therapeutic exercise;Balance training;Neuromuscular re-education;Patient/family education;Manual techniques;Passive range of motion;Energy conservation;Taping    PT Next Visit Plan Assess affectiveness of HEP, further assess balance/endurance, consider performing TUG and sit<>stand as treatment, reinforce log roll, Core/hip stability/flexibility. Check DF/PF strength should be doing band strengthening at home when out of AFOs.    PT Home Exercise Plan Access Code: [email or web address]    Consulted and Agree with Plan of Care Patient             Patient will benefit from skilled therapeutic intervention in order to improve the following deficits and impairments:  Abnormal gait, Decreased balance, Decreased endurance, Decreased mobility, Difficulty walking, Impaired sensation, Decreased range of motion, Impaired perceived functional ability, Improper body mechanics, Decreased strength, Decreased activity tolerance, Pain, Obesity  Visit  Diagnosis: Low back pain radiating to right lower extremity  Muscle weakness (generalized)  Other abnormalities of gait and mobility  Difficulty in walking, not elsewhere classified     Problem List Patient Active Problem List   Diagnosis Date Noted   Leg edema, left 03/03/2020   Bilateral swelling of feet and ankles 12/18/2018   Acute right ankle pain 09/18/2018   Foot drop, bilateral 01/25/2018   Morbid obesity with body mass index of 40.0-44.9 in adult [facility]) 12/04/2017   Genetic testing 03/03/2016   Polyposis syndrome gastric and colonic - attenuated 12/09/2015   Pre-operative clearance 11/13/2015   Essential hypertension 11/13/2015   NS (nuclear sclerosis) 03/11/2015   Degenerative arthritis of lumbar spine 09/03/2014   Lumbar and sacral osteoarthritis 09/03/2014   Ocular rosacea 08/19/2014   Incomplete rotator cuff tear 07/22/2014   Conjunctival chalasis 02/12/2014   Dry eye syndrome 02/12/2014   Dry eye 02/12/2014   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   Disease of eyelid 02/12/2014   SPL (spondylolisthesis) 11/27/2013   Family history of gastric cancer 05/16/2013   Family history of cancer of digestive organ 05/16/2013   Imbalance 06/14/2012   Peripheral neuropathic pain 06/14/2012   Neuropathy 06/14/2012   Glaucoma suspect 11/03/2011   Cataract, nuclear 11/03/2011   Posterior vitreous detachment 11/03/2011  Hole, retinal 11/03/2011   Ache in joint 05/26/2011   Gastro-esophageal reflux disease without esophagitis 05/26/2011   Acid reflux 05/26/2011   Hypercholesterolemia 05/26/2011   Hyperlipidemia 05/27/2009   Morbid obesity ([place]) 05/27/2009   GERD 09/11/2008   History of colonic polyps 09/11/2008   Multiple gastric polyps 09/11/2008   H/O disease 09/11/2008   HOARSENESS 09/10/2008   SLEEP APNEA 11/05/2007   ASTHMA 10/12/2007   Airway hyperreactivity 10/12/2007   GASTRITIS, CHRONIC 08/01/2006   AG (atrophic gastritis) 08/01/2006    Pollyann Samples 01/20/2021, 6:21 PM  [facility] [address] [place], [place], [address] Phone: [phone]   Fax:  [phone]  Name: Albert Barr MRN: [id number] Date of Birth: March 12, 1941

## 2021-01-25 ENCOUNTER — Encounter: Payer: Medicare PPO | Admitting: Physical Therapy

## 2021-02-01 ENCOUNTER — Ambulatory Visit: Payer: Medicare PPO | Attending: Neurology

## 2021-02-01 ENCOUNTER — Other Ambulatory Visit: Payer: Self-pay

## 2021-02-01 DIAGNOSIS — R262 Difficulty in walking, not elsewhere classified: Secondary | ICD-10-CM | POA: Insufficient documentation

## 2021-02-01 DIAGNOSIS — M6281 Muscle weakness (generalized): Secondary | ICD-10-CM | POA: Insufficient documentation

## 2021-02-01 DIAGNOSIS — R2689 Other abnormalities of gait and mobility: Secondary | ICD-10-CM | POA: Insufficient documentation

## 2021-02-01 DIAGNOSIS — M545 Low back pain, unspecified: Secondary | ICD-10-CM | POA: Diagnosis present

## 2021-02-01 DIAGNOSIS — M79604 Pain in right leg: Secondary | ICD-10-CM | POA: Insufficient documentation

## 2021-02-01 NOTE — Therapy (Signed)
[place], [place], [address] Phone: [phone]   Fax:  [phone]  Physical Therapy Treatment  Patient Details  Name: Albert Barr MRN: [id number] Date of Birth: 1941-03-06 Referring Provider (PT): Clayborne Dana   Encounter Date: 02/01/2021   PT End of Session - 02/01/21 1334     Visit Number 8    Number of Visits 17    Date for PT Re-Evaluation 03/05/21    Authorization Type [employer] M[employer] (FOTO done at reeval 7/13 visit 3), FOTO at v9 and v13.    Progress Note Due on Visit 13    PT Start Time 1333    PT Stop Time 1414    PT Time Calculation (min) 41 min    Activity Tolerance Patient tolerated treatment well    Behavior During Therapy WFL for tasks assessed/performed             Past Medical History:  Diagnosis Date   Allergy    Anal fissure    Arthritis    shoulder, knees   Asthma    Atrophic gastritis without mention of hemorrhage    Cataract    bilateraly   Esophageal reflux    Family history of gastric cancer    Fibula fracture    hair line fracture   Gastric polyps    history   Hiatal hernia    Hx of adenomatous colonic polyps    Hyperlipidemia    Hypertension    Metaplasia of esophagus 2011   "gastric metaplasia"   Morbid obesity ([place])    Obstructive sleep apnea on CPAP    Other voice and resonance disorders    Peripheral neuropathy    Polyposis syndrome gastric and colonic - attenuated 12/09/2015   Sleep apnea    uses CPAP   Squamous cell skin cancer 04/2018   lesion left forearm   Unspecified asthma(493.90)     Past Surgical History:  Procedure Laterality Date   APPENDECTOMY     CHOLECYSTECTOMY     COLONOSCOPY  08/20/2009 (multiple)   Tubular adenoma   ESOPHAGOGASTRODUODENOSCOPY  08/20/09 (multiple)   [place]  2007   KNEE SURGERY     arthroscopy- both knees   POLYPECTOMY     UPPER GASTROINTESTINAL ENDOSCOPY       There were no vitals filed for this visit.   Subjective Assessment - 02/01/21 1334     Subjective Patient reports the exercises have been helping. He continues to have radicular pain in the morning and at night in the Rt lower leg/ankle. He also reports bilateral shoulder arthitis, which is giving him more of an issue currently.    Pertinent History B neuropathy with B AFOs, Ashma, skin CA, sleep apnea with CPAP, morbid obesity, HLD, OA, R fib fx (2020) after fall on concrete.    Patient Stated Goals Get the nerve pain to allow sleep.    Currently in Pain? Yes    Pain Score 5     Pain Location Shoulder    Pain Orientation Left;Right    Pain Descriptors / Indicators Aching    Pain Type Chronic pain    Pain Onset More than a month ago    Pain Frequency Intermittent                [facility] PT Assessment - 02/01/21 0001       AROM   Lumbar Flexion 45cm    Lumbar Extension 75% limitation  Lumbar - Right Rotation 75% limitation    Lumbar - Left Rotation 75% limitation                           [facility] Adult PT Treatment/Exercise - 02/01/21 0001       Lumbar Exercises: Stretches   Passive Hamstring Stretch 60 seconds    Passive Hamstring Stretch Limitations bilateral    Lower Trunk Rotation Limitations 60 seconds    Hip Flexor Stretch 60 seconds    Hip Flexor Stretch Limitations bilateral    Other Lumbar Stretch Exercise sidelying open book 1  x10 bilateral      Lumbar Exercises: Standing   Other Standing Lumbar Exercises hip abduction 2 x 10      Lumbar Exercises: Seated   Sit to Stand 5 reps    Sit to Stand Limitations 2 sets from raised height    Other Seated Lumbar Exercises seated stability ball rollout 1 min                      PT Short Term Goals - 01/04/21 1503       PT SHORT TERM GOAL #1   Title Patient will be independent with initial HEP for symptom management.    Time 3    Period Weeks    Status On-going    Target  Date 01/29/21      PT SHORT TERM GOAL #2   Title Patient to be instructed on proper body mechanics to reduce pain during functional tasks.    Baseline Limited knowledge    Time 2    Period Weeks    Status On-going    Target Date 01/22/21      PT SHORT TERM GOAL #3   Title BERG balance performed for baseline and LTG updated to indicate balance progress.    Baseline 25/56    Status Achieved               PT Long Term Goals - 01/04/21 1506       PT LONG TERM GOAL #1   Title Patient will be independent with final HEP and progression to continue to reduce pain and symptoms after discharge.    Baseline Limited exercise at home    Time 8    Period Weeks    Status On-going    Target Date 03/05/21      PT LONG TERM GOAL #2   Title Hamstring length from 90/90 will improve to >50deg to reduce difficulty bending.    Baseline R=40  L=33    Time 8    Period Weeks    Status On-going    Target Date 03/05/21      PT LONG TERM GOAL #3   Title FOTO score will increase to 58% as predicted for improved perception of functional abilities.    Baseline 47%    Time 8    Period Weeks    Status On-going    Target Date 03/05/21      PT LONG TERM GOAL #4   Title Berg Balance improved to >45/56 to indicate lower fall risk.    Baseline 25/56    Time 8    Period Weeks    Status On-going    Target Date 03/05/21      PT LONG TERM GOAL #5   Title Patient will report sleeping through the night awakening no more than 1x secondary pain.  Baseline Multiple times awakening during the night with radicular pain in R LE.    Time 8    Period Weeks    Status On-going    Target Date 03/05/21                   Plan - 02/01/21 1353     Clinical Impression Statement Patient arrives without reports of LE pain. He continues to demonstrate limited trunk mobility in all planes, though no pain reported with trunk AROM. Focused on addressing trunk mobility deficits and improving flexibility  today. Able to complete sit to stand from raised mat height demonstrating good control with the ascent/descent, though initially requiring cues for proper foot placement. When he corrects his foot placement he reports improved balance when he assumes standing position. He tolerated session well today without reports of LE pain.    Comorbidities Asthma, morbid obesity, periferal neuropathy, sleep apnia on CPAP    Examination-Activity Limitations Bathing;Bed Mobility;Bend;Lift;Locomotion Level;Reach Overhead;Sleep;Squat;Stairs;Transfers    PT Treatment/Interventions ADLs/Self Care Home Management;Aquatic Therapy;Cryotherapy;Moist Heat;Ultrasound;Gait training;Stair training;Functional mobility training;Therapeutic activities;Therapeutic exercise;Balance training;Neuromuscular re-education;Patient/family education;Manual techniques;Passive range of motion;Energy conservation;Taping    PT Next Visit Plan reinforce log roll, Core/hip stability/flexibility. Check DF/PF strength should be doing band strengthening at home when out of AFOs, hip strengthening, trunk mobility    PT Home Exercise Plan Access Code: [email or web address]    Consulted and Agree with Plan of Care Patient             Patient will benefit from skilled therapeutic intervention in order to improve the following deficits and impairments:  Abnormal gait, Decreased balance, Decreased endurance, Decreased mobility, Difficulty walking, Impaired sensation, Decreased range of motion, Impaired perceived functional ability, Improper body mechanics, Decreased strength, Decreased activity tolerance, Pain, Obesity  Visit Diagnosis: Low back pain radiating to right lower extremity  Muscle weakness (generalized)  Other abnormalities of gait and mobility  Difficulty in walking, not elsewhere classified     Problem List Patient Active Problem List   Diagnosis Date Noted   Leg edema, left 03/03/2020   Bilateral swelling of feet and ankles 12/18/2018    Acute right ankle pain 09/18/2018   Foot drop, bilateral 01/25/2018   Morbid obesity with body mass index of 40.0-44.9 in adult [facility]) 12/04/2017   Genetic testing 03/03/2016   Polyposis syndrome gastric and colonic - attenuated 12/09/2015   Pre-operative clearance 11/13/2015   Essential hypertension 11/13/2015   NS (nuclear sclerosis) 03/11/2015   Degenerative arthritis of lumbar spine 09/03/2014   Lumbar and sacral osteoarthritis 09/03/2014   Ocular rosacea 08/19/2014   Incomplete rotator cuff tear 07/22/2014   Conjunctival chalasis 02/12/2014   Dry eye syndrome 02/12/2014   Dry eye 02/12/2014   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   Disease of eyelid 02/12/2014   SPL (spondylolisthesis) 11/27/2013   Family history of gastric cancer 05/16/2013   Family history of cancer of digestive organ 05/16/2013   Imbalance 06/14/2012   Peripheral neuropathic pain 06/14/2012   Neuropathy 06/14/2012   Glaucoma suspect 11/03/2011   Cataract, nuclear 11/03/2011   Posterior vitreous detachment 11/03/2011   Hole, retinal 11/03/2011   Ache in joint 05/26/2011   Gastro-esophageal reflux disease without esophagitis 05/26/2011   Acid reflux 05/26/2011   Hypercholesterolemia 05/26/2011   Hyperlipidemia 05/27/2009   Morbid obesity ([place]) 05/27/2009   GERD 09/11/2008   History of colonic polyps 09/11/2008   Multiple gastric polyps 09/11/2008   H/O disease 09/11/2008   HOARSENESS 09/10/2008   SLEEP APNEA 11/05/2007  ASTHMA 10/12/2007   Airway hyperreactivity 10/12/2007   GASTRITIS, CHRONIC 08/01/2006   AG (atrophic gastritis) 08/01/2006   Gwendolyn Grant, PT, DPT, ATC 02/01/21 2:23 PM  [facility] [address] [place], [place], [address] Phone: [phone]   Fax:  [phone]  Name: Albert Barr MRN: [id number] Date of Birth: [birth year]

## 2021-02-08 ENCOUNTER — Ambulatory Visit: Payer: Medicare PPO

## 2021-02-08 ENCOUNTER — Other Ambulatory Visit: Payer: Self-pay

## 2021-02-08 DIAGNOSIS — R262 Difficulty in walking, not elsewhere classified: Secondary | ICD-10-CM

## 2021-02-08 DIAGNOSIS — R2689 Other abnormalities of gait and mobility: Secondary | ICD-10-CM

## 2021-02-08 DIAGNOSIS — M6281 Muscle weakness (generalized): Secondary | ICD-10-CM

## 2021-02-08 DIAGNOSIS — M545 Low back pain, unspecified: Secondary | ICD-10-CM | POA: Diagnosis not present

## 2021-02-08 NOTE — Therapy (Signed)
[place], [place], [address] Phone: [phone]   Fax:  [phone]  Physical Therapy Treatment  Patient Details  Name: Albert Barr MRN: [id number] Date of Birth: Dec 15, 1940 Referring Provider (PT): Clayborne Dana   Encounter Date: 02/08/2021   PT End of Session - 02/08/21 1423     Visit Number 9    Number of Visits 17    Date for PT Re-Evaluation 03/05/21    Authorization Type [employer] MCR    Authorization Time Period 7/6-9/10/22    Authorization - Visit Number 8    Authorization - Number of Visits 16    Progress Note Due on Visit 10    PT Start Time [id number]   patient late   PT Stop Time 1501    PT Time Calculation (min) 38 min    Activity Tolerance Patient tolerated treatment well    Behavior During Therapy [facility] for tasks assessed/performed             Past Medical History:  Diagnosis Date   Allergy    Anal fissure    Arthritis    shoulder, knees   Asthma    Atrophic gastritis without mention of hemorrhage    Cataract    bilateraly   Esophageal reflux    Family history of gastric cancer    Fibula fracture    hair line fracture   Gastric polyps    history   Hiatal hernia    Hx of adenomatous colonic polyps    Hyperlipidemia    Hypertension    Metaplasia of esophagus 2011   "gastric metaplasia"   Morbid obesity ([place])    Obstructive sleep apnea on CPAP    Other voice and resonance disorders    Peripheral neuropathy    Polyposis syndrome gastric and colonic - attenuated 12/09/2015   Sleep apnea    uses CPAP   Squamous cell skin cancer 04/2018   lesion left forearm   Unspecified asthma(493.90)     Past Surgical History:  Procedure Laterality Date   APPENDECTOMY     CHOLECYSTECTOMY     COLONOSCOPY  08/20/2009 (multiple)   Tubular adenoma   ESOPHAGOGASTRODUODENOSCOPY  08/20/09 (multiple)   [place]  2007   KNEE SURGERY     arthroscopy- both  knees   POLYPECTOMY     UPPER GASTROINTESTINAL ENDOSCOPY      There were no vitals filed for this visit.   Subjective Assessment - 02/08/21 1424     Subjective Patient reports he is doing ok today. He reports compliance with [facility]    Currently in Pain? Yes    Pain Score 3     Pain Location Shoulder    Pain Orientation Left;Right    Pain Descriptors / Indicators Aching    Pain Type Chronic pain    Pain Onset More than a month ago    Pain Frequency Intermittent                               [facility] Adult PT Treatment/Exercise - 02/08/21 0001       Neuro Re-ed    Neuro Re-ed Details  romberg 30 sec, romberg eyes closed 3 trials (10-15 seconds each), romberg on foam 3 x 30 sec      Lumbar Exercises: Seated   Long Arc Quad on Chair 10 reps  LAQ on Chair Weights (lbs) 1    LAQ on Chair Limitations x2; bilateral    Sit to Stand 5 reps    Sit to Stand Limitations mat table      Knee/Hip Exercises: Standing   Other Standing Knee Exercises step taps 2 inch step; 2 x 10 occasional UE support      Manual Therapy   Passive ROM SKTC, hip IR/ER bilateral                      PT Short Term Goals - 01/04/21 1503       PT SHORT TERM GOAL #1   Title Patient will be independent with initial HEP for symptom management.    Time 3    Period Weeks    Status On-going    Target Date 01/29/21      PT SHORT TERM GOAL #2   Title Patient to be instructed on proper body mechanics to reduce pain during functional tasks.    Baseline Limited knowledge    Time 2    Period Weeks    Status On-going    Target Date 01/22/21      PT SHORT TERM GOAL #3   Title BERG balance performed for baseline and LTG updated to indicate balance progress.    Baseline 25/56    Status Achieved               PT Long Term Goals - 01/04/21 1506       PT LONG TERM GOAL #1   Title Patient will be independent with final HEP and progression to  continue to reduce pain and symptoms after discharge.    Baseline Limited exercise at home    Time 8    Period Weeks    Status On-going    Target Date 03/05/21      PT LONG TERM GOAL #2   Title Hamstring length from 90/90 will improve to >50deg to reduce difficulty bending.    Baseline R=40  L=33    Time 8    Period Weeks    Status On-going    Target Date 03/05/21      PT LONG TERM GOAL #3   Title FOTO score will increase to 58% as predicted for improved perception of functional abilities.    Baseline 47%    Time 8    Period Weeks    Status On-going    Target Date 03/05/21      PT LONG TERM GOAL #4   Title Berg Balance improved to >45/56 to indicate lower fall risk.    Baseline 25/56    Time 8    Period Weeks    Status On-going    Target Date 03/05/21      PT LONG TERM GOAL #5   Title Patient will report sleeping through the night awakening no more than 1x secondary pain.    Baseline Multiple times awakening during the night with radicular pain in R LE.    Time 8    Period Weeks    Status On-going    Target Date 03/05/21                   Plan - 02/08/21 1552     Clinical Impression Statement Patient arrives without reports of LE pain. He did well with static balance activity on unstable and stable surface having the most difficulty maintaining his balance when eyes were closed. He has significant difficulty maintaining  balance/control with forward step taps on 2 inch step, more so when the LLE is the stance leg. With continued practice, his control improves and he is able to maintain the step positioning for brief duration without UE support. Discussed potential need for AD with community ambulation, especially for curb/stair negotation with patient reporting [place] or when walking on uneven terrain. Recommended that patient bring his walking sticks to next session to utilize during gait/stair negotation. He as able  to complete sit to stand from normal mat height, though quickly fatigues. No reports of LE pain throughout session, though reported LE fatigue at conclusion.    Comorbidities Asthma, morbid obesity, periferal neuropathy, sleep apnia on CPAP    Examination-Activity Limitations Bathing;Bed Mobility;Bend;Lift;Locomotion Level;Reach Overhead;Sleep;Squat;Stairs;Transfers    PT Treatment/Interventions ADLs/Self Care Home Management;Aquatic Therapy;Cryotherapy;Moist Heat;Ultrasound;Gait training;Stair training;Functional mobility training;Therapeutic activities;Therapeutic exercise;Balance training;Neuromuscular re-education;Patient/family education;Manual techniques;Passive range of motion;Energy conservation;Taping    PT Next Visit Plan progress note, continue with LE strength and balance training. did patient bring walking sticks to continue with stepping/stair activity.    PT Home Exercise Plan Access Code: [email or web address]    Consulted and Agree with Plan of Care Patient             Patient will benefit from skilled therapeutic intervention in order to improve the following deficits and impairments:  Abnormal gait, Decreased balance, Decreased endurance, Decreased mobility, Difficulty walking, Impaired sensation, Decreased range of motion, Impaired perceived functional ability, Improper body mechanics, Decreased strength, Decreased activity tolerance, Pain, Obesity  Visit Diagnosis: Low back pain radiating to right lower extremity  Muscle weakness (generalized)  Other abnormalities of gait and mobility  Difficulty in walking, not elsewhere classified     Problem List Patient Active Problem List   Diagnosis Date Noted   Leg edema, left 03/03/2020   Bilateral swelling of feet and ankles 12/18/2018   Acute right ankle pain 09/18/2018   Foot drop, bilateral 01/25/2018   Morbid obesity with body mass index of 40.0-44.9 in adult [facility]) 12/04/2017   Genetic testing 03/03/2016   Polyposis syndrome  gastric and colonic - attenuated 12/09/2015   Pre-operative clearance 11/13/2015   Essential hypertension 11/13/2015   NS (nuclear sclerosis) 03/11/2015   Degenerative arthritis of lumbar spine 09/03/2014   Lumbar and sacral osteoarthritis 09/03/2014   Ocular rosacea 08/19/2014   Incomplete rotator cuff tear 07/22/2014   Conjunctival chalasis 02/12/2014   Dry eye syndrome 02/12/2014   Dry eye 02/12/2014   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   Disease of eyelid 02/12/2014   SPL (spondylolisthesis) 11/27/2013   Family history of gastric cancer 05/16/2013   Family history of cancer of digestive organ 05/16/2013   Imbalance 06/14/2012   Peripheral neuropathic pain 06/14/2012   Neuropathy 06/14/2012   Glaucoma suspect 11/03/2011   Cataract, nuclear 11/03/2011   Posterior vitreous detachment 11/03/2011   Hole, retinal 11/03/2011   Ache in joint 05/26/2011   Gastro-esophageal reflux disease without esophagitis 05/26/2011   Acid reflux 05/26/2011   Hypercholesterolemia 05/26/2011   Hyperlipidemia 05/27/2009   Morbid obesity ([place]) 05/27/2009   GERD 09/11/2008   History of colonic polyps 09/11/2008   Multiple gastric polyps 09/11/2008   H/O disease 09/11/2008   HOARSENESS 09/10/2008   SLEEP APNEA 11/05/2007   ASTHMA 10/12/2007   Airway hyperreactivity 10/12/2007   GASTRITIS, CHRONIC 08/01/2006   AG (atrophic gastritis) 08/01/2006   Gwendolyn Grant, PT, DPT, ATC 02/08/21 4:02 PM   [facility]  [place], [place], [address] Phone: [phone]   Fax:  [phone]  Name: Albert Barr MRN: [id number] Date of Birth: Aug 09, 1940

## 2021-02-17 ENCOUNTER — Ambulatory Visit: Payer: Medicare PPO

## 2021-02-17 ENCOUNTER — Other Ambulatory Visit: Payer: Self-pay

## 2021-02-17 DIAGNOSIS — R2689 Other abnormalities of gait and mobility: Secondary | ICD-10-CM

## 2021-02-17 DIAGNOSIS — M545 Low back pain, unspecified: Secondary | ICD-10-CM | POA: Diagnosis not present

## 2021-02-17 DIAGNOSIS — M6281 Muscle weakness (generalized): Secondary | ICD-10-CM

## 2021-02-17 DIAGNOSIS — M79604 Pain in right leg: Secondary | ICD-10-CM

## 2021-02-17 DIAGNOSIS — R262 Difficulty in walking, not elsewhere classified: Secondary | ICD-10-CM

## 2021-02-17 NOTE — Patient Instructions (Signed)

## 2021-02-17 NOTE — Therapy (Signed)
[place], [place], [address] Phone: [phone]   Fax:  [phone]  Physical Therapy Treatment/Progress Note   Progress Note Reporting Period 01/04/21 to 02/17/21  See note below for Objective Data and Assessment of Progress/Goals.      Patient Details  Name: Albert Barr MRN: [id number] Date of Birth: 07-07-1940 Referring Provider (PT): Clayborne Dana   Encounter Date: 02/17/2021   PT End of Session - 02/17/21 1235     Visit Number 10    Number of Visits 17    Date for PT Re-Evaluation 03/05/21    Authorization Type [employer] MCR    Authorization Time Period 7/6-9/10/22    Authorization - Visit Number 9    Authorization - Number of Visits 16    Progress Note Due on Visit 20    PT Start Time 5329    PT Stop Time 1320    PT Time Calculation (min) 45 min    Activity Tolerance Patient tolerated treatment well    Behavior During Therapy WFL for tasks assessed/performed             Past Medical History:  Diagnosis Date   Allergy    Anal fissure    Arthritis    shoulder, knees   Asthma    Atrophic gastritis without mention of hemorrhage    Cataract    bilateraly   Esophageal reflux    Family history of gastric cancer    Fibula fracture    hair line fracture   Gastric polyps    history   Hiatal hernia    Hx of adenomatous colonic polyps    Hyperlipidemia    Hypertension    Metaplasia of esophagus 2011   "gastric metaplasia"   Morbid obesity ([place])    Obstructive sleep apnea on CPAP    Other voice and resonance disorders    Peripheral neuropathy    Polyposis syndrome gastric and colonic - attenuated 12/09/2015   Sleep apnea    uses CPAP   Squamous cell skin cancer 04/2018   lesion left forearm   Unspecified asthma(493.90)     Past Surgical History:  Procedure Laterality Date   APPENDECTOMY     CHOLECYSTECTOMY     COLONOSCOPY  08/20/2009 (multiple)   Tubular adenoma    ESOPHAGOGASTRODUODENOSCOPY  08/20/09 (multiple)   [place]  2007   KNEE SURGERY     arthroscopy- both knees   POLYPECTOMY     UPPER GASTROINTESTINAL ENDOSCOPY      There were no vitals filed for this visit.   Subjective Assessment - 02/17/21 1239     Subjective No particular pain today, just some in the shoulders. He reports compliance with HEP.    Pertinent History B neuropathy with B AFOs, [place], skin CA, sleep apnea with CPAP, morbid obesity, HLD, OA, R fib fx (2020) after fall on concrete.    Currently in Pain? Yes    Pain Score 3     Pain Location Shoulder    Pain Orientation Left;Right    Pain Descriptors / Indicators Aching    Pain Type Chronic pain    Pain Onset More than a month ago    Pain Frequency Intermittent                [facility] PT Assessment - 02/17/21 0001       Observation/Other Assessments   Focus on Therapeutic Outcomes (FOTO)  50%  Flexibility   Hamstrings Rt 40, Lt 30      Berg Balance Test   Sit to Stand Able to stand without using hands and stabilize independently    Standing Unsupported Able to stand safely 2 minutes    Sitting with Back Unsupported but Feet Supported on Floor or Stool Able to sit safely and securely 2 minutes    Stand to Sit Sits safely with minimal use of hands    Transfers Able to transfer safely, minor use of hands    Standing Unsupported with Eyes Closed Able to stand 10 seconds safely    Standing Unsupported with Feet Together Able to place feet together independently and stand 1 minute safely    From Standing, Reach Forward with Outstretched Arm Can reach forward >5 cm safely (2")    From Standing Position, Pick up Object from Floor Unable to pick up shoe, but reaches 2-5 cm (1-2") from shoe and balances independently    From Standing Position, Turn to Look Behind Over each Shoulder Turn sideways only but maintains balance    Turn 360 Degrees Able to turn 360 degrees safely but slowly     Standing Unsupported, Alternately Place Feet on Step/Stool Needs assistance to keep from falling or unable to try    Standing Unsupported, One Foot in Front Able to take small step independently and hold 30 seconds    Standing on One Leg Tries to lift leg/unable to hold 3 seconds but remains standing independently    Total Score 39                           [facility] Adult PT Treatment/Exercise - 02/17/21 0001       Self-Care   Self-Care Other Self-Care Comments    Other Self-Care Comments  see patient education      Neuro Re-ed    Neuro Re-ed Details  semi tandem 2 x 30 sec; see BERG balance for further NMR      Lumbar Exercises: Seated   Sit to Stand 5 reps    Sit to Stand Limitations mat table      Knee/Hip Exercises: Standing   Other Standing Knee Exercises marching 2 x 20                    PT Education - 02/17/21 1243     Education Details Education on progress towards goals, Body mechanics with lifting/bending activity, reviewed FOTO score, recommendation on reacher to assist in reaching objects, recommendation on stretch strap for stretching as part of HEP.    Person(s) Educated Patient    Methods Explanation;Demonstration;Handout    Comprehension Verbalized understanding              PT Short Term Goals - 02/17/21 1241       PT SHORT TERM GOAL #1   Title Patient will be independent with initial HEP for symptom management.    Time 3    Period Weeks    Status Achieved    Target Date 01/29/21      PT SHORT TERM GOAL #2   Title Patient to be instructed on proper body mechanics to reduce pain during functional tasks.    Baseline educated on proper bending/lifting mechanics    Time 2    Period Weeks    Status Achieved    Target Date 01/22/21      PT SHORT TERM GOAL #3   Title BERG  balance performed for baseline and LTG updated to indicate balance progress.    Baseline 25/56    Status Achieved               PT Long Term  Goals - 02/17/21 1241       PT LONG TERM GOAL #1   Title Patient will be independent with final HEP and progression to continue to reduce pain and symptoms after discharge.    Baseline progressing HEP as appropriate    Time 8    Period Weeks    Status On-going      PT LONG TERM GOAL #2   Title Hamstring length from 90/90 will improve to >50deg to reduce difficulty bending.    Baseline see flowsheet    Time 8    Period Weeks    Status On-going      PT LONG TERM GOAL #3   Title FOTO score will increase to 58% as predicted for improved perception of functional abilities.    Baseline see flowsheet    Time 8    Period Weeks    Status On-going      PT LONG TERM GOAL #4   Title Berg Balance improved to >45/56 to indicate lower fall risk.    Baseline 39/56    Time 8    Period Weeks    Status On-going      PT LONG TERM GOAL #5   Title Patient will report sleeping through the night awakening no more than 1x secondary pain.    Baseline leg pain when he first wakes up    Time 8    Period Weeks    Status Achieved                   Plan - 02/17/21 1313     Clinical Impression Statement Patient is making steady progress towards his established functional goals. All short term goals have been met at this tim and he has met 1/5 long term functional goals. His hamstring flexibility remains relatively unchanged compared to baseline. His BERG balance score has significantly improved compared to initial evaluation, though still scores at increased fall risk scoring 39/56 today. His FOTO functional outcome score has modestly improved compared to baseline. He wil benefit from continuing with current POC to address ongoing balance impairments and train in advanced home program in order to optimize his function and reduce his risk of falls.    Comorbidities Asthma, morbid obesity, periferal neuropathy, sleep apnia on CPAP    Examination-Activity Limitations Bathing;Bed  Mobility;Bend;Lift;Locomotion Level;Reach Overhead;Sleep;Squat;Stairs;Transfers    PT Treatment/Interventions ADLs/Self Care Home Management;Aquatic Therapy;Cryotherapy;Moist Heat;Ultrasound;Gait training;Stair training;Functional mobility training;Therapeutic activities;Therapeutic exercise;Balance training;Neuromuscular re-education;Patient/family education;Manual techniques;Passive range of motion;Energy conservation;Taping    PT Next Visit Plan continue with LE strength and balance training. did patient bring walking sticks to continue with stepping/stair activity.    PT Home Exercise Plan Access Code: [email or web address]    Consulted and Agree with Plan of Care Patient             Patient will benefit from skilled therapeutic intervention in order to improve the following deficits and impairments:  Abnormal gait, Decreased balance, Decreased endurance, Decreased mobility, Difficulty walking, Impaired sensation, Decreased range of motion, Impaired perceived functional ability, Improper body mechanics, Decreased strength, Decreased activity tolerance, Pain, Obesity  Visit Diagnosis: Low back pain radiating to right lower extremity  Muscle weakness (generalized)  Other abnormalities of gait and mobility  Difficulty in walking, not elsewhere classified  Problem List Patient Active Problem List   Diagnosis Date Noted   Leg edema, left 03/03/2020   Bilateral swelling of feet and ankles 12/18/2018   Acute right ankle pain 09/18/2018   Foot drop, bilateral 01/25/2018   Morbid obesity with body mass index of 40.0-44.9 in adult [facility]) 12/04/2017   Genetic testing 03/03/2016   Polyposis syndrome gastric and colonic - attenuated 12/09/2015   Pre-operative clearance 11/13/2015   Essential hypertension 11/13/2015   NS (nuclear sclerosis) 03/11/2015   Degenerative arthritis of lumbar spine 09/03/2014   Lumbar and sacral osteoarthritis 09/03/2014   Ocular rosacea 08/19/2014   Incomplete rotator  cuff tear 07/22/2014   Conjunctival chalasis 02/12/2014   Dry eye syndrome 02/12/2014   Dry eye 02/12/2014   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   Disease of eyelid 02/12/2014   SPL (spondylolisthesis) 11/27/2013   Family history of gastric cancer 05/16/2013   Family history of cancer of digestive organ 05/16/2013   Imbalance 06/14/2012   Peripheral neuropathic pain 06/14/2012   Neuropathy 06/14/2012   Glaucoma suspect 11/03/2011   Cataract, nuclear 11/03/2011   Posterior vitreous detachment 11/03/2011   Hole, retinal 11/03/2011   Ache in joint 05/26/2011   Gastro-esophageal reflux disease without esophagitis 05/26/2011   Acid reflux 05/26/2011   Hypercholesterolemia 05/26/2011   Hyperlipidemia 05/27/2009   Morbid obesity ([place]) 05/27/2009   GERD 09/11/2008   History of colonic polyps 09/11/2008   Multiple gastric polyps 09/11/2008   H/O disease 09/11/2008   HOARSENESS 09/10/2008   SLEEP APNEA 11/05/2007   ASTHMA 10/12/2007   Airway hyperreactivity 10/12/2007   GASTRITIS, CHRONIC 08/01/2006   AG (atrophic gastritis) 08/01/2006   Gwendolyn Grant, PT, DPT, ATC 02/17/21 1:34 PM   [facility] [address] [place], [place], [address] Phone: [phone]   Fax:  [phone]  Name: Albert Barr MRN: [id number] Date of Birth: 05-01-1941

## 2021-02-24 ENCOUNTER — Other Ambulatory Visit: Payer: Self-pay

## 2021-02-24 ENCOUNTER — Ambulatory Visit: Payer: Medicare PPO

## 2021-02-24 DIAGNOSIS — M545 Low back pain, unspecified: Secondary | ICD-10-CM

## 2021-02-24 DIAGNOSIS — M6281 Muscle weakness (generalized): Secondary | ICD-10-CM

## 2021-02-24 DIAGNOSIS — R262 Difficulty in walking, not elsewhere classified: Secondary | ICD-10-CM

## 2021-02-24 DIAGNOSIS — R2689 Other abnormalities of gait and mobility: Secondary | ICD-10-CM

## 2021-02-24 NOTE — Therapy (Signed)
[place]bert Barr, [place], [address] Phone: [phone]   Fax:  [phone]  Physical Therapy Treatment  Patient Details  Name: Albert Barr MRN: [id number] Date of Birth: 1940/10/31 Referring Provider (PT): Clayborne Dana   Encounter Date: 02/24/2021   PT End of Session - 02/24/21 1320     Visit Number 11    Number of Visits 17    Date for PT Re-Evaluation 03/05/21    Authorization Type [employer] MCR    Authorization Time Period 7/6-9/10/22    Authorization - Visit Number 10    Authorization - Number of Visits 16    Progress Note Due on Visit 20    PT Start Time 1320    PT Stop Time 1402    PT Time Calculation (min) 42 min    Equipment Utilized During Treatment Other (comment)   SPC   Activity Tolerance Patient tolerated treatment well    Behavior During Therapy WFL for tasks assessed/performed             Past Medical History:  Diagnosis Date   Allergy    Anal fissure    Arthritis    shoulder, knees   Asthma    Atrophic gastritis without mention of hemorrhage    Cataract    bilateraly   Esophageal reflux    Family history of gastric cancer    Fibula fracture    hair line fracture   Gastric polyps    history   Hiatal hernia    Hx of adenomatous colonic polyps    Hyperlipidemia    Hypertension    Metaplasia of esophagus 2011   "gastric metaplasia"   Morbid obesity ([place])    Obstructive sleep apnea on CPAP    Other voice and resonance disorders    Peripheral neuropathy    Polyposis syndrome gastric and colonic - attenuated 12/09/2015   Sleep apnea    uses CPAP   Squamous cell skin cancer 04/2018   lesion left forearm   Unspecified asthma(493.90)     Past Surgical History:  Procedure Laterality Date   APPENDECTOMY     CHOLECYSTECTOMY     COLONOSCOPY  08/20/2009 (multiple)   Tubular adenoma   ESOPHAGOGASTRODUODENOSCOPY  08/20/09 (multiple)   [place]  2007   KNEE SURGERY     arthroscopy- both knees   POLYPECTOMY     UPPER GASTROINTESTINAL ENDOSCOPY      There were no vitals filed for this visit.   Subjective Assessment - 02/24/21 1323     Subjective Patient reports he is doing well with just some shoulder pain.    Pertinent History B neuropathy with B AFOs, Ashma, skin CA, sleep apnea with CPAP, morbid obesity, HLD, OA, R fib fx (2020) after fall on concrete.    Currently in Pain? Yes    Pain Score 3     Pain Location Shoulder    Pain Orientation Right;Left    Pain Descriptors / Indicators Aching    Pain Type Chronic pain    Pain Onset More than a month ago    Pain Frequency Intermittent                               [facility] Adult PT Treatment/Exercise - 02/24/21 0001       Ambulation/Gait   Gait Comments gait training with SPC x 40 ft; stair training with cane  trialing step up and down on 4 inch step      Self-Care   Other Self-Care Comments  see patient education      Neuro Re-ed    Neuro Re-ed Details  romberg eyes closed 3 x 30 sec, romberg on foam 3 x 30 sec; romberg on foam eyes closed attempted 3 trials      Knee/Hip Exercises: Standing   Hip Abduction 10 reps    Abduction Limitations x2; bilateral    Other Standing Knee Exercises marching 2 x 20    Other Standing Knee Exercises hip extension 2 x 10 bilateral                    PT Education - 02/24/21 1407     Education Details Education on type of cane to purchase. Education on gait pattern with stair/curb negotation. Updated HEP.    Person(s) Educated Patient    Methods Explanation;Demonstration;Handout;Tactile cues;Verbal cues    Comprehension Verbalized understanding;Returned demonstration;Verbal cues required;Tactile cues required;Need further instruction              PT Short Term Goals - 02/17/21 1241       PT SHORT TERM GOAL #1   Title Patient will be independent with initial HEP for symptom  management.    Time 3    Period Weeks    Status Achieved    Target Date 01/29/21      PT SHORT TERM GOAL #2   Title Patient to be instructed on proper body mechanics to reduce pain during functional tasks.    Baseline educated on proper bending/lifting mechanics    Time 2    Period Weeks    Status Achieved    Target Date 01/22/21      PT SHORT TERM GOAL #3   Title BERG balance performed for baseline and LTG updated to indicate balance progress.    Baseline 25/56    Status Achieved               PT Long Term Goals - 02/17/21 1241       PT LONG TERM GOAL #1   Title Patient will be independent with final HEP and progression to continue to reduce pain and symptoms after discharge.    Baseline progressing HEP as appropriate    Time 8    Period Weeks    Status On-going      PT LONG TERM GOAL #2   Title Hamstring length from 90/90 will improve to >50deg to reduce difficulty bending.    Baseline see flowsheet    Time 8    Period Weeks    Status On-going      PT LONG TERM GOAL #3   Title FOTO score will increase to 58% as predicted for improved perception of functional abilities.    Baseline see flowsheet    Time 8    Period Weeks    Status On-going      PT LONG TERM GOAL #4   Title Berg Balance improved to >45/56 to indicate lower fall risk.    Baseline 39/56    Time 8    Period Weeks    Status On-going      PT LONG TERM GOAL #5   Title Patient will report sleeping through the night awakening no more than 1x secondary pain.    Baseline leg pain when he first wakes up    Time 8    Period Weeks    Status  Achieved                   Plan - 02/24/21 1351     Clinical Impression Statement [facility] today as patient has been unable to complete step ups without UE support at previous sessions and reports that in the community he is unable to navigate curbs unless there is a car to hold onto. He was able to perform step up on 4 inch step well with SPC,  though he is hesistant with step down with use of the cane requiring Min A to perform. He likely requires continued practice with SPC to improve his confidence and stability. Provided patient information on appropriate cane to purchase and provided him handout to reinforce appropriate gait pattern when ascending/descending a step. He demonstrates good balance during narrow based activity on level surface and foam, though is unable to maintain romberg on foam with eyes closed. Able to progress standing strengthening with patient requiring occasional seated rest break.    Comorbidities Asthma, morbid obesity, periferal neuropathy, sleep apnia on CPAP    Examination-Activity Limitations Bathing;Bed Mobility;Bend;Lift;Locomotion Level;Reach Overhead;Sleep;Squat;Stairs;Transfers    PT Treatment/Interventions ADLs/Self Care Home Management;Aquatic Therapy;Cryotherapy;Moist Heat;Ultrasound;Gait training;Stair training;Functional mobility training;Therapeutic activities;Therapeutic exercise;Balance training;Neuromuscular re-education;Patient/family education;Manual techniques;Passive range of motion;Energy conservation;Taping    PT Next Visit Plan continue with LE strength and balance training. did patient bring walking sticks to continue with stepping/stair activity.    PT Home Exercise Plan Access Code: [email or web address]    Consulted and Agree with Plan of Care Patient             Patient will benefit from skilled therapeutic intervention in order to improve the following deficits and impairments:  Abnormal gait, Decreased balance, Decreased endurance, Decreased mobility, Difficulty walking, Impaired sensation, Decreased range of motion, Impaired perceived functional ability, Improper body mechanics, Decreased strength, Decreased activity tolerance, Pain, Obesity  Visit Diagnosis: Low back pain radiating to right lower extremity  Muscle weakness (generalized)  Other abnormalities of gait and  mobility  Difficulty in walking, not elsewhere classified     Problem List Patient Active Problem List   Diagnosis Date Noted   Leg edema, left 03/03/2020   Bilateral swelling of feet and ankles 12/18/2018   Acute right ankle pain 09/18/2018   Foot drop, bilateral 01/25/2018   Morbid obesity with body mass index of 40.0-44.9 in adult [facility]) 12/04/2017   Genetic testing 03/03/2016   Polyposis syndrome gastric and colonic - attenuated 12/09/2015   Pre-operative clearance 11/13/2015   Essential hypertension 11/13/2015   NS (nuclear sclerosis) 03/11/2015   Degenerative arthritis of lumbar spine 09/03/2014   Lumbar and sacral osteoarthritis 09/03/2014   Ocular rosacea 08/19/2014   Incomplete rotator cuff tear 07/22/2014   Conjunctival chalasis 02/12/2014   Dry eye syndrome 02/12/2014   Dry eye 02/12/2014   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   Disease of eyelid 02/12/2014   SPL (spondylolisthesis) 11/27/2013   Family history of gastric cancer 05/16/2013   Family history of cancer of digestive organ 05/16/2013   Imbalance 06/14/2012   Peripheral neuropathic pain 06/14/2012   Neuropathy 06/14/2012   Glaucoma suspect 11/03/2011   Cataract, nuclear 11/03/2011   Posterior vitreous detachment 11/03/2011   Hole, retinal 11/03/2011   Ache in joint 05/26/2011   Gastro-esophageal reflux disease without esophagitis 05/26/2011   Acid reflux 05/26/2011   Hypercholesterolemia 05/26/2011   Hyperlipidemia 05/27/2009   Morbid obesity ([place]) 05/27/2009   GERD 09/11/2008   History of colonic polyps 09/11/2008   Multiple gastric  polyps 09/11/2008   H/O disease 09/11/2008   HOARSENESS 09/10/2008   SLEEP APNEA 11/05/2007   ASTHMA 10/12/2007   Airway hyperreactivity 10/12/2007   GASTRITIS, CHRONIC 08/01/2006   AG (atrophic gastritis) 08/01/2006   Gwendolyn Grant, PT, DPT, ATC 02/24/21 2:13 PM   [facility] [address] [place],  [place], [address] Phone: [phone]   Fax:  [phone]  Name: KARO LEINONEN MRN: [id number] Date of Birth: [birth year]

## 2021-03-03 ENCOUNTER — Ambulatory Visit: Payer: Medicare PPO | Attending: Neurology

## 2021-03-03 ENCOUNTER — Other Ambulatory Visit: Payer: Self-pay

## 2021-03-03 DIAGNOSIS — R2689 Other abnormalities of gait and mobility: Secondary | ICD-10-CM | POA: Diagnosis present

## 2021-03-03 DIAGNOSIS — M79604 Pain in right leg: Secondary | ICD-10-CM | POA: Diagnosis present

## 2021-03-03 DIAGNOSIS — M6281 Muscle weakness (generalized): Secondary | ICD-10-CM | POA: Diagnosis present

## 2021-03-03 DIAGNOSIS — M545 Low back pain, unspecified: Secondary | ICD-10-CM | POA: Insufficient documentation

## 2021-03-03 DIAGNOSIS — R262 Difficulty in walking, not elsewhere classified: Secondary | ICD-10-CM | POA: Diagnosis present

## 2021-03-03 NOTE — Therapy (Signed)
[place], [place], [address] Phone: [phone]   Fax:  [phone]  Physical Therapy Treatment/Re-evaluation/Discharge  Patient Details  Name: Albert Barr MRN: [id number] Date of Birth: 08/04/1940 Referring Provider (PT): Clayborne Dana   Encounter Date: 03/03/2021   PT End of Session - 03/03/21 1556     Visit Number 12    Number of Visits 17    Date for PT Re-Evaluation 03/05/21    Authorization Type [employer] MCR    Authorization Time Period 7/6-9/10/22    Authorization - Visit Number 11    Authorization - Number of Visits 16    Progress Note Due on Visit 20    PT Start Time 1555   patient late   PT Stop Time 1614    PT Time Calculation (min) 19 min    Activity Tolerance Patient tolerated treatment well    Behavior During Therapy [facility] for tasks assessed/performed             Past Medical History:  Diagnosis Date   Allergy    Anal fissure    Arthritis    shoulder, knees   Asthma    Atrophic gastritis without mention of hemorrhage    Cataract    bilateraly   Esophageal reflux    Family history of gastric cancer    Fibula fracture    hair line fracture   Gastric polyps    history   Hiatal hernia    Hx of adenomatous colonic polyps    Hyperlipidemia    Hypertension    Metaplasia of esophagus 2011   "gastric metaplasia"   Morbid obesity ([place])    Obstructive sleep apnea on CPAP    Other voice and resonance disorders    Peripheral neuropathy    Polyposis syndrome gastric and colonic - attenuated 12/09/2015   Sleep apnea    uses CPAP   Squamous cell skin cancer 04/2018   lesion left forearm   Unspecified asthma(493.90)     Past Surgical History:  Procedure Laterality Date   APPENDECTOMY     CHOLECYSTECTOMY     COLONOSCOPY  08/20/2009 (multiple)   Tubular adenoma   ESOPHAGOGASTRODUODENOSCOPY  08/20/09 (multiple)   [place]  2007   KNEE SURGERY      arthroscopy- both knees   POLYPECTOMY     UPPER GASTROINTESTINAL ENDOSCOPY      There were no vitals filed for this visit.   Subjective Assessment - 03/03/21 1557     Subjective Continues to have shoulder pain, but back and legs are feeling ok.    Pertinent History B neuropathy with B AFOs, Ashma, skin CA, sleep apnea with CPAP, morbid obesity, HLD, OA, R fib fx (2020) after fall on concrete.    Currently in Pain? Yes    Pain Score 3     Pain Location Shoulder    Pain Orientation Left;Right    Pain Descriptors / Indicators Aching    Pain Type Chronic pain    Pain Onset More than a month ago    Pain Frequency Intermittent    Aggravating Factors  movement    Pain Relieving Factors rest                [facility] PT Assessment - 03/03/21 0001       Observation/Other Assessments   Focus on Therapeutic Outcomes (FOTO)  44%      Strength   Right Hip Flexion 4+/5  Right Hip ABduction 4/5    Left Hip Flexion 4+/5    Left Hip ABduction 4/5      Flexibility   Hamstrings Rt 38, Lt 30      Berg Balance Test   Sit to Stand Able to stand without using hands and stabilize independently    Standing Unsupported Able to stand safely 2 minutes    Sitting with Back Unsupported but Feet Supported on Floor or Stool Able to sit safely and securely 2 minutes    Stand to Sit Sits safely with minimal use of hands    Transfers Able to transfer safely, minor use of hands    Standing Unsupported with Eyes Closed Able to stand 10 seconds safely    Standing Unsupported with Feet Together Able to place feet together independently and stand 1 minute safely    From Standing, Reach Forward with Outstretched Arm Can reach forward >5 cm safely (2")    From Standing Position, Pick up Object from Floor Able to pick up shoe, needs supervision    From Standing Position, Turn to Look Behind Over each Shoulder Turn sideways only but maintains balance    Turn 360 Degrees Able to turn 360 degrees safely  but slowly    Standing Unsupported, Alternately Place Feet on Step/Stool Needs assistance to keep from falling or unable to try    Standing Unsupported, One Foot in Front Able to take small step independently and hold 30 seconds    Standing on One Leg Tries to lift leg/unable to hold 3 seconds but remains standing independently    Total Score 40                           [facility] Adult PT Treatment/Exercise - 03/03/21 0001       Self-Care   Other Self-Care Comments  see patient education                     PT Education - 03/03/21 1558     Education Details Education on re-assessment findings, FOTO score, discharge education, reviewed HEP, discussed use of stretch strap at home.    Person(s) Educated Patient    Methods Explanation    Comprehension Verbalized understanding              PT Short Term Goals - 02/17/21 1241       PT SHORT TERM GOAL #1   Title Patient will be independent with initial HEP for symptom management.    Time 3    Period Weeks    Status Achieved    Target Date 01/29/21      PT SHORT TERM GOAL #2   Title Patient to be instructed on proper body mechanics to reduce pain during functional tasks.    Baseline educated on proper bending/lifting mechanics    Time 2    Period Weeks    Status Achieved    Target Date 01/22/21      PT SHORT TERM GOAL #3   Title BERG balance performed for baseline and LTG updated to indicate balance progress.    Baseline 25/56    Status Achieved               PT Long Term Goals - 03/03/21 1557       PT LONG TERM GOAL #1   Title Patient will be independent with final HEP and progression to continue to reduce pain and symptoms after  discharge.    Time 8    Period Weeks    Status Achieved      PT LONG TERM GOAL #2   Title Hamstring length from 90/90 will improve to >50deg to reduce difficulty bending.    Baseline see flowsheet    Time 8    Period Weeks    Status On-going      PT  LONG TERM GOAL #3   Title FOTO score will increase to 58% as predicted for improved perception of functional abilities.    Baseline see flowsheet    Time 8    Period Weeks    Status On-going      PT LONG TERM GOAL #4   Title Berg Balance improved to >45/56 to indicate lower fall risk.    Baseline 40/56 9/7    Time 8    Period Weeks    Status On-going      PT LONG TERM GOAL #5   Title Patient will report sleeping through the night awakening no more than 1x secondary pain.    Baseline leg pain when he first wakes up    Time 8    Period Weeks    Status Achieved                   Plan - 03/03/21 1616     Clinical Impression Statement Albert Barr has made good functional progress since start of care with improvements noted in hip strength and significant improvements in balance based upon his BERG assessment. He continues to have hamstring tightness bilaterally and has difficulty with narrow based and single leg balance activity. He has met his maximal rehab potential and demonstrates independence with advanced home program to further progress his strength and balance. He is therefore appropriate for D/C at this time.    Comorbidities Asthma, morbid obesity, periferal neuropathy, sleep apnia on CPAP    Examination-Activity Limitations Bathing;Bed Mobility;Bend;Lift;Locomotion Level;Reach Overhead;Sleep;Squat;Stairs;Transfers    PT Treatment/Interventions ADLs/Self Care Home Management;Aquatic Therapy;Cryotherapy;Moist Heat;Ultrasound;Gait training;Stair training;Functional mobility training;Therapeutic activities;Therapeutic exercise;Balance training;Neuromuscular re-education;Patient/family education;Manual techniques;Passive range of motion;Energy conservation;Taping    PT Next Visit Plan --    PT Home Exercise Plan Access Code: [email or web address]    Consulted and Agree with Plan of Care Patient             Patient will benefit from skilled therapeutic intervention in order to improve  the following deficits and impairments:  Abnormal gait, Decreased balance, Decreased endurance, Decreased mobility, Difficulty walking, Impaired sensation, Decreased range of motion, Impaired perceived functional ability, Improper body mechanics, Decreased strength, Decreased activity tolerance, Pain, Obesity  Visit Diagnosis: Low back pain radiating to right lower extremity  Muscle weakness (generalized)  Other abnormalities of gait and mobility  Difficulty in walking, not elsewhere classified     Problem List Patient Active Problem List   Diagnosis Date Noted   Leg edema, left 03/03/2020   Bilateral swelling of feet and ankles 12/18/2018   Acute right ankle pain 09/18/2018   Foot drop, bilateral 01/25/2018   Morbid obesity with body mass index of 40.0-44.9 in adult [facility]) 12/04/2017   Genetic testing 03/03/2016   Polyposis syndrome gastric and colonic - attenuated 12/09/2015   Pre-operative clearance 11/13/2015   Essential hypertension 11/13/2015   NS (nuclear sclerosis) 03/11/2015   Degenerative arthritis of lumbar spine 09/03/2014   Lumbar and sacral osteoarthritis 09/03/2014   Ocular rosacea 08/19/2014   Incomplete rotator cuff tear 07/22/2014   Conjunctival chalasis 02/12/2014   Dry eye  syndrome 02/12/2014   Dry eye 02/12/2014   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   Disease of eyelid 02/12/2014   SPL (spondylolisthesis) 11/27/2013   Family history of gastric cancer 05/16/2013   Family history of cancer of digestive organ 05/16/2013   Imbalance 06/14/2012   Peripheral neuropathic pain 06/14/2012   Neuropathy 06/14/2012   Glaucoma suspect 11/03/2011   Cataract, nuclear 11/03/2011   Posterior vitreous detachment 11/03/2011   Hole, retinal 11/03/2011   Ache in joint 05/26/2011   Gastro-esophageal reflux disease without esophagitis 05/26/2011   Acid reflux 05/26/2011   Hypercholesterolemia 05/26/2011   Hyperlipidemia 05/27/2009   Morbid obesity ([place]) 05/27/2009    GERD 09/11/2008   History of colonic polyps 09/11/2008   Multiple gastric polyps 09/11/2008   H/O disease 09/11/2008   HOARSENESS 09/10/2008   SLEEP APNEA 11/05/2007   ASTHMA 10/12/2007   Airway hyperreactivity 10/12/2007   GASTRITIS, CHRONIC 08/01/2006   AG (atrophic gastritis) 08/01/2006   PHYSICAL THERAPY DISCHARGE SUMMARY  Visits from Start of Care: 12  Current functional level related to goals / functional outcomes: See goals above   Remaining deficits: See impression above   Education / Equipment: See education above   Patient agrees to discharge. Patient goals were partially met. Patient is being discharged due to maximized rehab potential.   Gwendolyn Grant, PT, DPT, ATC 03/03/21 5:33 PM   [place], [place], [address] Phone: [phone]   Fax:  [phone]  Name: Albert Barr MRN: [id number] Date of Birth: [birth year]

## 2021-04-08 ENCOUNTER — Other Ambulatory Visit: Payer: Self-pay | Admitting: Sports Medicine

## 2021-06-08 ENCOUNTER — Ambulatory Visit (INDEPENDENT_AMBULATORY_CARE_PROVIDER_SITE_OTHER): Payer: Medicare PPO | Admitting: Sports Medicine

## 2021-06-08 ENCOUNTER — Ambulatory Visit
Admission: RE | Admit: 2021-06-08 | Discharge: 2021-06-08 | Disposition: A | Discharge status: Home / Self Care | Payer: Medicare PPO | Source: Ambulatory Visit | Attending: Sports Medicine | Admitting: Sports Medicine

## 2021-06-08 VITALS — BP 128/81 | Ht 69.0 in | Wt 254.0 lb

## 2021-06-08 DIAGNOSIS — M1712 Unilateral primary osteoarthritis, left knee: Secondary | ICD-10-CM | POA: Diagnosis not present

## 2021-06-08 DIAGNOSIS — G8929 Other chronic pain: Secondary | ICD-10-CM | POA: Diagnosis not present

## 2021-06-08 DIAGNOSIS — M25562 Pain in left knee: Secondary | ICD-10-CM

## 2021-06-08 DIAGNOSIS — M1711 Unilateral primary osteoarthritis, right knee: Secondary | ICD-10-CM | POA: Diagnosis not present

## 2021-06-08 DIAGNOSIS — M25712 Osteophyte, left shoulder: Secondary | ICD-10-CM | POA: Diagnosis not present

## 2021-06-08 DIAGNOSIS — M25561 Pain in right knee: Secondary | ICD-10-CM

## 2021-06-08 DIAGNOSIS — M25511 Pain in right shoulder: Secondary | ICD-10-CM | POA: Diagnosis not present

## 2021-06-08 DIAGNOSIS — M25512 Pain in left shoulder: Secondary | ICD-10-CM

## 2021-06-08 DIAGNOSIS — M19011 Primary osteoarthritis, right shoulder: Secondary | ICD-10-CM | POA: Diagnosis not present

## 2021-06-08 DIAGNOSIS — M19012 Primary osteoarthritis, left shoulder: Secondary | ICD-10-CM | POA: Diagnosis not present

## 2021-06-08 NOTE — Progress Notes (Signed)
PCP: Reynold Bowen, MD  Subjective:   HPI: Patient is a 80 y.o. male here for evaluation of bilateral knee and bilateral shoulder pain.  Patient states he has dealt with these issues on and off for the last few years.  He has had issues with his lower extremities, with bilateral AFO braces for foot drop and lower leg edema that is managed by Dr. Oneida Alar and his primary care physician.  In terms of his knees, he will get occasional sharp pain in the inner portion of the knees, worse with certain activities and at nighttime.  He will sleep with a pillow between the legs and underneath the knees that does help his comfort.  He does get neuropathy as well.  He is taking gabapentin 600 mg in the a.m. 900 mg nightly.  He does follow with a neurologist for the neuropathy. Reports stiffness of the knees as well. He does report history of menisectomy of bilateral knees many years ago.  In terms of the shoulders, there is pain on the anterior lateral aspect of both shoulders.  He does report that he has diminished range of motion.  He will have some stiffness about the shoulders.  He has been applying an over-the-counter gel of the shoulders that has a burning sensation which does provide some relief.  Of note, he has lost just over 50 lbs with his primary doing Wegovy and intermittent fasting.  Past Medical History:  Diagnosis Date   Allergy    Anal fissure    Arthritis    shoulder, knees   Asthma    Atrophic gastritis without mention of hemorrhage    Cataract    bilateraly   Esophageal reflux    Family history of gastric cancer    Fibula fracture    hair line fracture   Gastric polyps    history   Hiatal hernia    Hx of adenomatous colonic polyps    Hyperlipidemia    Hypertension    Metaplasia of esophagus 2011   "gastric metaplasia"   Morbid obesity ([place])    Obstructive sleep apnea on CPAP    Other voice and resonance disorders    Peripheral neuropathy    Polyposis syndrome gastric  and colonic - attenuated 12/09/2015   Sleep apnea    uses CPAP   Squamous cell skin cancer 04/2018   lesion left forearm   Unspecified asthma(493.90)      BP 128/81    Ht 5\' 9"  (1.753 m)    Wt 254 lb (115.2 kg)    BMI 37.51 kg/m   No flowsheet data found.  No flowsheet data found.      Objective:  Physical Exam:  Gen: Well-appearing, in no acute distress; non-toxic CV: Regular Rate. Well-perfused. Warm.  Resp: Breathing unlabored on room air; no wheezing. Psych: Fluid speech in conversation; appropriate affect; normal thought process Neuro: Sensation intact throughout. No gross coordination deficits.  MSK:  - Bilateral knees: Inspection yields no gross effusion, erythema or ecchymosis.  There is mild TTP bilateral medial joint lines, and over the pes anserine area.  Range of motion is limited in extension of about 12 degrees of the right knee, 8-10 degrees of the left knee.  Flexion to approximately 120 degrees bilaterally.  There is no varus or valgus instability.  Strength testing is equivocal bilaterally.  He does have bilateral AFO braces on today. -Bilateral shoulders: Inspection knee has no effusion, erythema or ecchymosis.  No significant TTP throughout.  There is  restriction of range of motion in all directions both passive and actively.  External rotation to only about 25 degrees on the right 30 degrees on the left.  He can forward flex approximate 160 degrees, although with some scapular compensation.  Strength 5/5 throughout.  Neurovascular tact distally.   Assessment & Plan:  1. Bilateral knee pain  2. Bilateral shoulder pain 3. Bilateral foot drop 4. Neuropathy 5. Obesity  In terms of his knee pain, there likely is a degree of arthritis in the knees, although we do not have any previous x-ray imaging.  He does have an extension block of about 10 degrees of both knees.  Given this we will obtain bilateral standing x-rays of each knee AP, lateral, sunrise to determine  the degree of arthritic change and joint space loss.  He has severely diminished passive and active range of motion about the shoulders, we will also obtain x-ray imaging of each shoulder to determine if this is a bony block from arthritic change versus an adhesive capsulitis picture.  We will call the patient once these images return to discuss neck steps of treatment/management.  He likely would benefit from Pilates physical therapy or some personal PT to work on range of motion about the shoulders specifically.  He may continue topical medications in the meantime.  Elba Barman, DO PGY-4, Sports Medicine Fellow [place]  I observed and examined the patient with the [facility] resident and agree with assessment and plan.  Note reviewed and modified by me. Ila Mcgill, MD

## 2021-06-11 ENCOUNTER — Telehealth: Payer: Self-pay | Admitting: Sports Medicine

## 2021-06-11 NOTE — Telephone Encounter (Signed)
I called Albert Barr via telephone, he confirmed identity.  We discussed the results of his x-rays.  This shows severe arthritis of the glenohumeral joints of both shoulders.  Moderate-severe arthritis of bilateral knees.  We discussed management options for him.  We will send to his email a set of range of motion exercises for the shoulders to begin try doing once daily.  He granted permission to send these via email.  For the knees, I discussed that a stationary bike or aquatic pool therapy would be a better option in terms of exercise and movement for him, to which he agrees.  He will try some of these exercises at home, and check back in in about 6-8 weeks.  We did discuss the option of cortisone injections if the pain got bad enough, although he is aware that this is not a fix for his arthritis.  Answered all questions, he was encouraged to call if any other questions arise.  Elba Barman, DO PGY-4, Sports Medicine Fellow [place]

## 2021-06-27 HISTORY — PX: EYE SURGERY: SHX253

## 2021-07-06 DIAGNOSIS — G609 Hereditary and idiopathic neuropathy, unspecified: Secondary | ICD-10-CM | POA: Diagnosis not present

## 2021-07-06 DIAGNOSIS — I872 Venous insufficiency (chronic) (peripheral): Secondary | ICD-10-CM | POA: Diagnosis not present

## 2021-07-06 DIAGNOSIS — I10 Essential (primary) hypertension: Secondary | ICD-10-CM | POA: Diagnosis not present

## 2021-07-06 DIAGNOSIS — D126 Benign neoplasm of colon, unspecified: Secondary | ICD-10-CM | POA: Diagnosis not present

## 2021-07-06 DIAGNOSIS — E785 Hyperlipidemia, unspecified: Secondary | ICD-10-CM | POA: Diagnosis not present

## 2021-07-06 DIAGNOSIS — J45909 Unspecified asthma, uncomplicated: Secondary | ICD-10-CM | POA: Diagnosis not present

## 2021-07-06 DIAGNOSIS — N401 Enlarged prostate with lower urinary tract symptoms: Secondary | ICD-10-CM | POA: Diagnosis not present

## 2021-07-06 DIAGNOSIS — I739 Peripheral vascular disease, unspecified: Secondary | ICD-10-CM | POA: Diagnosis not present

## 2021-07-27 DIAGNOSIS — H2513 Age-related nuclear cataract, bilateral: Secondary | ICD-10-CM | POA: Diagnosis not present

## 2021-07-27 DIAGNOSIS — Z01818 Encounter for other preprocedural examination: Secondary | ICD-10-CM | POA: Diagnosis not present

## 2021-07-27 DIAGNOSIS — H2512 Age-related nuclear cataract, left eye: Secondary | ICD-10-CM | POA: Diagnosis not present

## 2021-08-05 DIAGNOSIS — H2512 Age-related nuclear cataract, left eye: Secondary | ICD-10-CM | POA: Diagnosis not present

## 2021-08-05 DIAGNOSIS — H2513 Age-related nuclear cataract, bilateral: Secondary | ICD-10-CM | POA: Diagnosis not present

## 2021-08-05 DIAGNOSIS — H25812 Combined forms of age-related cataract, left eye: Secondary | ICD-10-CM | POA: Diagnosis not present

## 2021-08-05 DIAGNOSIS — H52202 Unspecified astigmatism, left eye: Secondary | ICD-10-CM | POA: Diagnosis not present

## 2021-08-05 DIAGNOSIS — H52201 Unspecified astigmatism, right eye: Secondary | ICD-10-CM | POA: Diagnosis not present

## 2021-08-19 DIAGNOSIS — H52201 Unspecified astigmatism, right eye: Secondary | ICD-10-CM | POA: Diagnosis not present

## 2021-08-19 DIAGNOSIS — H25811 Combined forms of age-related cataract, right eye: Secondary | ICD-10-CM | POA: Diagnosis not present

## 2021-09-05 ENCOUNTER — Other Ambulatory Visit: Payer: Self-pay | Admitting: Sports Medicine

## 2021-09-14 ENCOUNTER — Encounter: Payer: Self-pay | Admitting: Adult Health

## 2021-09-14 ENCOUNTER — Ambulatory Visit (INDEPENDENT_AMBULATORY_CARE_PROVIDER_SITE_OTHER): Payer: Medicare PPO | Admitting: Adult Health

## 2021-09-14 ENCOUNTER — Other Ambulatory Visit: Payer: Self-pay

## 2021-09-14 VITALS — BP 139/73 | HR 74 | Ht 65.0 in | Wt 263.0 lb

## 2021-09-14 DIAGNOSIS — Z9989 Dependence on other enabling machines and devices: Secondary | ICD-10-CM | POA: Diagnosis not present

## 2021-09-14 DIAGNOSIS — G4733 Obstructive sleep apnea (adult) (pediatric): Secondary | ICD-10-CM

## 2021-09-14 NOTE — Progress Notes (Signed)
? ? ?PATIENT: Albert Barr ?DOB: [birth year] ? ?REASON FOR VISIT: follow up ?HISTORY FROM: patient ?PRIMARY NEUROLOGIST: Dr. Brett Barr ? ?HISTORY OF PRESENT ILLNESS: ?Today 09/14/21: ? ?Albert Barr is an 81 year old male with a history of obstructive sleep apnea on CPAP.  He returns today for follow-up.  He reports that CPAP is working well.  He does feel that the pressure could be slightly increased.  Denies any new issues ? ? ?REVIEW OF SYSTEMS: Out of a complete 14 system review of symptoms, the patient complains only of the following symptoms, and all other reviewed systems are negative. ? ? ?ESS 3 ? ?ALLERGIES: ?Allergies  ?Allergen Reactions  ? Beclomethasone Rash  ? Moxifloxacin   ?  REACTION: Diarrhea  ? ? ?HOME MEDICATIONS: ?Outpatient Medications Prior to Visit  ?Medication Sig Dispense Refill  ? ADVAIR DISKUS 250-50 MCG/DOSE AEPB Inhale 1 puff into the lungs 2 (two) times daily.    ? albuterol (PROVENTIL HFA;VENTOLIN HFA) 108 (90 BASE) MCG/ACT inhaler Inhale 2 puffs into the lungs every 6 (six) hours as needed.    ? AMBULATORY NON FORMULARY MEDICATION Wylene Men 2 1/2  ? ?Foot drop, bilateral  ?Codes: M21.371, M21.372 1 Device 1  ? ANDROGEL PUMP 20.25 MG/ACT (1.62%) GEL 2 Squirts daily.    ? ascorbic acid (VITAMIN C) 250 MG tablet Take 1 tablet by mouth 2 (two) times daily.    ? aspirin 81 MG tablet Take 81 mg by mouth daily.    ? Cyanocobalamin (B-12) 2000 MCG TABS take one a day    ? cycloSPORINE (RESTASIS) 0.05 % ophthalmic emulsion Place 1 drop into both eyes 2 (two) times daily.    ? ezetimibe (ZETIA) 10 MG tablet Take 1 tablet by mouth daily.    ? fish oil-omega-3 fatty acids 1000 MG capsule Take 1 g by mouth 2 (two) times daily.    ? folic acid (FOLVITE) 1 MG tablet Take 1 tablet by mouth as needed.    ? gabapentin (NEURONTIN) 300 MG capsule TAKE 2 CAPSULES BY MOUTH THREE TIMES DAILY 180 capsule 2  ? hydrochlorothiazide (HYDRODIURIL) 25 MG tablet TAKE 1/2 TABLET BY MOUTH EVERY DAY 60 tablet 0   ? Iron Polysacch Cmplx-B12-FA (IFEREX 150 FORTE PO) Take 1 tablet by mouth daily.    ? loratadine (CLARITIN) 10 MG tablet Take 10 mg by mouth as needed.    ? minocycline (MINOCIN,DYNACIN) 100 MG capsule Take 100 mg by mouth as needed. Reported on 09/07/2015    ? mirabegron ER (MYRBETRIQ) 25 MG TB24 tablet Take 1 tablet by mouth daily.    ? Multiple Vitamin (VITAMIN E/FOLIC JGGE/Z-6/O-29) CAPS Take by mouth every morning.    ? omeprazole (PRILOSEC) 40 MG capsule Take 1 capsule by mouth daily.    ? Polyethyl Glycol-Propyl Glycol (SYSTANE ULTRA) 0.4-0.3 % SOLN Place 1-2 drops into both eyes as needed.    ? rosuvastatin (CRESTOR) 20 MG tablet Take 20 mg by mouth daily.    ? Semaglutide-Weight Management (WEGOVY) 2.4 MG/0.75ML SOAJ Inject 2.4 mg into the skin once a week.    ? VITAMIN B COMPLEX-C PO Take 1 tablet by mouth daily.    ? Vitamin D, Ergocalciferol, (DRISDOL) 1.25 MG (50000 UNIT) CAPS capsule Take 1 capsule by mouth once a week.    ? zafirlukast (ACCOLATE) 20 MG tablet Take 20 mg by mouth 2 (two) times daily.    ? vitamin E 180 MG (400 UNITS) capsule Take by mouth.    ? Vitamin E  400 UNITS TABS Take 1 tablet by mouth 2 (two) times daily.    ? ?No facility-administered medications prior to visit.  ? ? ?PAST MEDICAL HISTORY: ?Past Medical History:  ?Diagnosis Date  ? Allergy   ? Anal fissure   ? Arthritis   ? shoulder, knees  ? Asthma   ? Atrophic gastritis without mention of hemorrhage   ? Cataract   ? bilateraly  ? Esophageal reflux   ? Family history of gastric cancer   ? Fibula fracture   ? hair line fracture  ? Gastric polyps   ? history  ? Hiatal hernia   ? Hx of adenomatous colonic polyps   ? Hyperlipidemia   ? Hypertension   ? Metaplasia of esophagus 2011  ? "gastric metaplasia"  ? Morbid obesity ([place])   ? Obstructive sleep apnea on CPAP   ? Other voice and resonance disorders   ? Peripheral neuropathy   ? Polyposis syndrome gastric and colonic - attenuated 12/09/2015  ? Sleep apnea   ? uses CPAP  ?  Squamous cell skin cancer 04/2018  ? lesion left forearm  ? Unspecified asthma(493.90)   ? ? ?PAST SURGICAL HISTORY: ?Past Surgical History:  ?Procedure Laterality Date  ? APPENDECTOMY    ? CHOLECYSTECTOMY    ? COLONOSCOPY  08/20/2009 (multiple)  ? Tubular adenoma  ? ESOPHAGOGASTRODUODENOSCOPY  08/20/09 (multiple)  ? INCISION AND DRAINAGE PERIRECTAL ABSCESS  2007  ? KNEE SURGERY    ? arthroscopy- both knees  ? POLYPECTOMY    ? UPPER GASTROINTESTINAL ENDOSCOPY    ? ? ?FAMILY HISTORY: ?Family History  ?Problem Relation Age of Onset  ? Stomach cancer Mother 76  ? Cancer Sister   ?     bladder  ? Sleep apnea Son   ? Colon cancer Neg Hx   ? Esophageal cancer Neg Hx   ? Rectal cancer Neg Hx   ? ? ?SOCIAL HISTORY: ?Social History  ? ?Socioeconomic History  ? Marital status: Married  ?  Spouse name: Not on file  ? Number of children: 3  ? Years of education: Not on file  ? Highest education level: Not on file  ?Occupational History  ? Occupation: Retired  ?  Employer: [employer]  ?  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 Smoking status: Former  ?  Packs/day: 2.00  ?  Years: 10.00  ?  Pack years: 20.00  ?  Types: Cigarettes  ?  Quit date: 02/16/1974  ?  Years since quitting: 47.6  ? Smokeless tobacco: Never  ?Vaping Use  ? Vaping Use: Never used  ?Substance and Sexual Activity  ? Alcohol use: Yes  ?  Comment: moderate  ? Drug use: No  ? Sexual activity: Not on file  ?Other Topics Concern  ? Not on file  ?Social History Narrative  ? Married, 3 children has grandchildren  ? 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 Previous [employer] administration  ? 1 term [place] [employer]  ? [place] [employer], retired, Actor of captain  ? Number smoker, moderate alcohol use described, no drug use  ? ?Social Determinants of Health  ? ?Financial Resource Strain: Not on file  ?Food Insecurity: Not on file  ?Transportation Needs: Not on file  ?Physical Activity: Not on file   ?Stress: Not on file  ?Social Connections: Not on file  ?Intimate Partner Violence: Not on file  ? ? ? ? ?PHYSICAL EXAM ? ?  Vitals:  ? 09/14/21 1406  ?BP: 139/73  ?Pulse: 74  ?Weight: 263 lb (119.3 kg)  ?Height: '5\' 5"'$  (1.651 m)  ? ?Body mass index is 43.77 kg/m?. ? ?Generalized: Well developed, in no acute distress  ?Chest: Lungs clear to auscultation bilaterally ? ?Neurological examination  ?Mentation: Alert oriented to time, place, history taking. Follows all commands speech and language fluent ?Cranial nerve II-XII: Extraocular movements were full, visual field were full on confrontational test Head turning and shoulder shrug  were normal and symmetric. ?Motor: The motor testing reveals 5 over 5 strength of all 4 extremities. Good symmetric motor tone is noted throughout.  ?Sensory: Sensory testing is intact to soft touch on all 4 extremities. No evidence of extinction is noted.  ?Gait and station: Gait is normal.  ? ? ?DIAGNOSTIC DATA (LABS, IMAGING, TESTING) ?- I reviewed patient records, labs, notes, testing and imaging myself where available. ? ?No results found for: WBC, HGB, HCT, MCV, PLT ?   ?Component Value Date/Time  ? NA 145 04/23/2008 1012  ? K 4.1 04/23/2008 1012  ? CL 108 04/23/2008 1012  ? CO2 31 04/23/2008 1012  ? GLUCOSE 96 04/23/2008 1012  ? BUN 13 04/23/2008 1012  ? CREATININE 0.8 04/23/2008 1012  ? CALCIUM 9.2 04/23/2008 1012  ? PROT 6.9 04/23/2008 1012  ? ALBUMIN 4.0 04/23/2008 1012  ? AST 23 04/23/2008 1012  ? ALT 25 04/23/2008 1012  ? ALKPHOS 38 (L) 04/23/2008 1012  ? BILITOT 0.6 04/23/2008 1012  ? GFRNONAA 102 04/23/2008 1012  ? GFRAA 124 04/23/2008 1012  ? ?Lab Results  ?Component Value Date  ? CHOL 156 04/23/2008  ? HDL 42.3 04/23/2008  ? Isle 89 04/23/2008  ? TRIG 122 04/23/2008  ? CHOLHDL 3.7 CALC 04/23/2008  ? ?Lab Results  ?Component Value Date  ? HGBA1C 5.6 05/18/2007  ? ?No results found for: VITAMINB12 ?No results found for: TSH ? ? ? ?ASSESSMENT AND PLAN ?81 y.o. year old male   has a past medical history of Allergy, Anal fissure, Arthritis, Asthma, Atrophic gastritis without mention of hemorrhage, Cataract, Esophageal reflux, Family history of gastric cancer, Fibula fracture, Gast

## 2021-09-14 NOTE — Patient Instructions (Addendum)
Continue using CPAP nightly and greater than 4 hours each night °If your symptoms worsen or you develop new symptoms please let us know.  ° °

## 2021-09-15 NOTE — Progress Notes (Signed)
Message ?Received: Today ?Denyse Amass, RN; Vanessa Ralphs ?got it!   ? ?  ?Previous Messages ?  ?----- Message -----  ?From: Brandon Melnick, RN  ?Sent: 09/14/2021   2:38 PM EDT  ?To: Ocie Bob  ? ?New orders in Epic for pt  ?Otila Back. Cassaday "[name]"  ?Male, 81 y.o., 11/13/1940  ?Pronouns:  ?he/him/his  ?MRN:  ?[id number]  ?Thanks Acupuncturist  ? ?

## 2021-11-24 ENCOUNTER — Other Ambulatory Visit: Payer: Self-pay | Admitting: Sports Medicine

## 2022-01-10 DIAGNOSIS — Z125 Encounter for screening for malignant neoplasm of prostate: Secondary | ICD-10-CM | POA: Diagnosis not present

## 2022-01-10 DIAGNOSIS — R7989 Other specified abnormal findings of blood chemistry: Secondary | ICD-10-CM | POA: Diagnosis not present

## 2022-01-10 DIAGNOSIS — E291 Testicular hypofunction: Secondary | ICD-10-CM | POA: Diagnosis not present

## 2022-01-10 DIAGNOSIS — D519 Vitamin B12 deficiency anemia, unspecified: Secondary | ICD-10-CM | POA: Diagnosis not present

## 2022-01-10 DIAGNOSIS — I10 Essential (primary) hypertension: Secondary | ICD-10-CM | POA: Diagnosis not present

## 2022-01-10 DIAGNOSIS — E785 Hyperlipidemia, unspecified: Secondary | ICD-10-CM | POA: Diagnosis not present

## 2022-01-18 ENCOUNTER — Ambulatory Visit (INDEPENDENT_AMBULATORY_CARE_PROVIDER_SITE_OTHER): Payer: Medicare PPO | Admitting: Sports Medicine

## 2022-01-18 VITALS — BP 139/74 | Ht 68.5 in | Wt 250.0 lb

## 2022-01-18 DIAGNOSIS — M792 Neuralgia and neuritis, unspecified: Secondary | ICD-10-CM

## 2022-01-18 DIAGNOSIS — M19011 Primary osteoarthritis, right shoulder: Secondary | ICD-10-CM

## 2022-01-18 DIAGNOSIS — M19012 Primary osteoarthritis, left shoulder: Secondary | ICD-10-CM

## 2022-01-18 MED ORDER — MELOXICAM 15 MG PO TABS: 15.0000 mg | ORAL_TABLET | Freq: Every day | ORAL | 2 refills | Status: DC | PRN

## 2022-01-18 NOTE — Patient Instructions (Signed)
For shoulder discomfort would recommend trying meloxicam 15 mg daily instead of Aleve at night.  These medications are not to be taken together. Can try Voltaren and Arnica topicals on differing days to see if 1 gives more relief than the other, to patient's discretion.  Due to patient's advanced shoulder arthritis biggest improvement in range of motion would likely be from surgical intervention.  We will trial pool exercises for range of motion.  Patient may return to clinic for steroid injection if pain becomes worsened. In the setting of balance, neuropathy and recent falls we will send an order for neuro rehabilitation eval.  Follow-up in 3 months

## 2022-01-18 NOTE — Assessment & Plan Note (Addendum)
-   He has had multiple mechanical falls in the past few months, likely secondary to poor balance and neuropathy.  Neuro rehab order sent today for evaluation and treatment to prevent falls.

## 2022-01-18 NOTE — Progress Notes (Signed)
Established Patient Office Visit  Subjective   Patient ID: Albert Barr, male    DOB: [birth year]  Age: 81 y.o. MRN: [id number]  Bilateral shoulder pain. Albert Barr presents today for bilateral shoulder pain that has been present for many months but becoming more bothersome as his knee pain has seemed to improve.  He reports he has most pain with activities repetitively, overuse.  His pain is usually a 4-5/10 but sometimes after a lot of use they may ache and keep him up at night.  He denies any radiation of his pain down his arms or weakness in his hands.  He uses Aleve at nighttime and Salonpas patches which seem to provide alleviation of pain.  Evaluation of this in Dec. 2022 showed advanced OA in both shoulders.  Of note he has recently had a couple of mechanical falls and due to his bilateral arm pain and decreased range of motion this has limited his ability to get himself off the ground.  He has known peripheral neuropathy and bilat foot drop for which he uses AFO braces.    Objective:     BP 139/74   Ht 5' 8.5" (1.74 m)   Wt 250 lb (113.4 kg)   BMI 37.46 kg/m   Physical Exam Vitals reviewed.  Constitutional:      General: He is not in acute distress.    Appearance: He is obese. He is not ill-appearing, toxic-appearing or diaphoretic.     Comments: Ambulates with cane, unsteady gait  Neurological:     Mental Status: He is alert.   Bilateral shoulders: No obvious deformity or asymmetry.  No tenderness to palpation over the acromioclavicular joint, upper trapezius muscles or rhomboids.  Greatly limited active range of motion forward flexion 95 degrees, abduction 85 degrees, internal and external rotation.  Slightly improved passive range of motion.  Negative empty can, negative Hawkins, negative Neer's.    Assessment & Plan:   Problem List Items Addressed This Visit       Musculoskeletal and Integument   Primary osteoarthritis of both shoulders - Primary    - On review  of x-rays performed at previous visit, obvious joint space narrowing, bone-on-bone formation and osteophytes present, which is likely the culprit of his decreased range of motion.  At this point range of motion likely best to be restored with orthopedic procedure.  Patient is not interested at this time.  Encouraged patient to work on range of motion activities especially in the pool, water aerobics.  We we will switch his Aleve nightly to meloxicam 15 mg daily to see if he has better relief with that medication.  We will also trial Voltaren gel versus Arnica oil.  Discussed with patient steroid injection may also provide pain relief.  Patient stated he will return to clinic if his pain becomes worse.      Relevant Medications   meloxicam (MOBIC) 15 MG tablet     Other   Peripheral neuropathic pain    - He has had multiple mechanical falls in the past few months, likely secondary to poor balance and neuropathy.  Neuro rehab order sent today for evaluation and treatment to prevent falls.      Relevant Orders   Ambulatory referral to Physical Therapy    Return in about 3 months (around 04/20/2022), or sooner if symptoms worsen or fail to improve.    Elmore Guise, DO  I observed and examined the patient with the [facility] resident and agree  with assessment and plan.  Note reviewed and modified by me. WE hope Neuro rehab can help reduce fall risk. Ila Mcgill, MD

## 2022-01-18 NOTE — Assessment & Plan Note (Signed)
-   On review of x-rays performed at previous visit, obvious joint space narrowing, bone-on-bone formation and osteophytes present, which is likely the culprit of his decreased range of motion.  At this point range of motion likely best to be restored with orthopedic procedure.  Patient is not interested at this time.  Encouraged patient to work on range of motion activities especially in the pool, water aerobics.  We we will switch his Aleve nightly to meloxicam 15 mg daily to see if he has better relief with that medication.  We will also trial Voltaren gel versus Arnica oil.  Discussed with patient steroid injection may also provide pain relief.  Patient stated he will return to clinic if his pain becomes worse.

## 2022-01-25 ENCOUNTER — Ambulatory Visit: Payer: Medicare PPO | Attending: Sports Medicine | Admitting: Physical Therapy

## 2022-01-25 DIAGNOSIS — M792 Neuralgia and neuritis, unspecified: Secondary | ICD-10-CM | POA: Insufficient documentation

## 2022-01-25 DIAGNOSIS — M545 Low back pain, unspecified: Secondary | ICD-10-CM | POA: Diagnosis not present

## 2022-01-25 DIAGNOSIS — R2689 Other abnormalities of gait and mobility: Secondary | ICD-10-CM | POA: Insufficient documentation

## 2022-01-25 DIAGNOSIS — M6281 Muscle weakness (generalized): Secondary | ICD-10-CM | POA: Insufficient documentation

## 2022-01-25 DIAGNOSIS — M79604 Pain in right leg: Secondary | ICD-10-CM | POA: Diagnosis not present

## 2022-01-25 DIAGNOSIS — R262 Difficulty in walking, not elsewhere classified: Secondary | ICD-10-CM | POA: Insufficient documentation

## 2022-01-25 NOTE — Therapy (Signed)
OUTPATIENT PHYSICAL THERAPY NEURO EVALUATION   Patient Name: Albert Barr MRN: [id number] DOB:Dec 29, 1940, 81 y.o., male Today's Date: 01/25/2022   PCP: Reynold Bowen, MD  REFERRING PROVIDER: Stefanie Libel, MD    PT End of Session - 01/25/22 1325     Visit Number 1    Number of Visits 13   Plus eval   Date for PT Re-Evaluation 03/15/22    Authorization Type [employer]    Progress Note Due on Visit 10    PT Start Time 1320    PT Stop Time 1402    PT Time Calculation (min) 42 min    Equipment Utilized During Treatment Other (comment)   Bilateral Blue Rocker AFOs   Activity Tolerance Patient tolerated treatment well    Behavior During Therapy WFL for tasks assessed/performed             Past Medical History:  Diagnosis Date   Allergy    Anal fissure    Arthritis    shoulder, knees   Asthma    Atrophic gastritis without mention of hemorrhage    Cataract    bilateraly   Esophageal reflux    Family history of gastric cancer    Fibula fracture    hair line fracture   Gastric polyps    history   Hiatal hernia    Hx of adenomatous colonic polyps    Hyperlipidemia    Hypertension    Metaplasia of esophagus 2011   "gastric metaplasia"   Morbid obesity ([place])    Obstructive sleep apnea on CPAP    Other voice and resonance disorders    Peripheral neuropathy    Polyposis syndrome gastric and colonic - attenuated 12/09/2015   Sleep apnea    uses CPAP   Squamous cell skin cancer 04/2018   lesion left forearm   Unspecified asthma(493.90)    Past Surgical History:  Procedure Laterality Date   APPENDECTOMY     CHOLECYSTECTOMY     COLONOSCOPY  08/20/2009 (multiple)   Tubular adenoma   ESOPHAGOGASTRODUODENOSCOPY  08/20/09 (multiple)   [facility]  2007   KNEE SURGERY     arthroscopy- both knees   POLYPECTOMY     UPPER GASTROINTESTINAL ENDOSCOPY     Patient Active Problem List   Diagnosis Date Noted   Primary osteoarthritis of  both shoulders 01/18/2022   Leg edema, left 03/03/2020   Bilateral swelling of feet and ankles 12/18/2018   Acute right ankle pain 09/18/2018   Foot drop, bilateral 01/25/2018   Morbid obesity with body mass index of 40.0-44.9 in adult [facility]) 12/04/2017   Genetic testing 03/03/2016   Polyposis syndrome gastric and colonic - attenuated 12/09/2015   Pre-operative clearance 11/13/2015   Essential hypertension 11/13/2015   NS (nuclear sclerosis) 03/11/2015   Degenerative arthritis of lumbar spine 09/03/2014   Lumbar and sacral osteoarthritis 09/03/2014   Ocular rosacea 08/19/2014   Incomplete rotator cuff tear 07/22/2014   Conjunctival chalasis 02/12/2014   Dry eye syndrome 02/12/2014   Dry eye 02/12/2014   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   Disease of eyelid 02/12/2014   SPL (spondylolisthesis) 11/27/2013   Family history of gastric cancer 05/16/2013   Family history of cancer of digestive organ 05/16/2013   Imbalance 06/14/2012   Peripheral neuropathic pain 06/14/2012   Neuropathy 06/14/2012   Glaucoma suspect 11/03/2011   Cataract, nuclear 11/03/2011   Posterior vitreous detachment 11/03/2011   Hole, retinal 11/03/2011   Ache in joint 05/26/2011  Gastro-esophageal reflux disease without esophagitis 05/26/2011   Acid reflux 05/26/2011   Hypercholesterolemia 05/26/2011   Hyperlipidemia 05/27/2009   Morbid obesity ([place]) 05/27/2009   GERD 09/11/2008   History of colonic polyps 09/11/2008   Multiple gastric polyps 09/11/2008   H/O disease 09/11/2008   HOARSENESS 09/10/2008   SLEEP APNEA 11/05/2007   ASTHMA 10/12/2007   Airway hyperreactivity 10/12/2007   GASTRITIS, CHRONIC 08/01/2006   AG (atrophic gastritis) 08/01/2006    ONSET DATE: 01/18/2022 (referral)   REFERRING DIAG: M79.2 (ICD-10-CM) - Peripheral neuropathic pain   THERAPY DIAG:  Other abnormalities of gait and mobility  Difficulty in walking, not elsewhere classified  Rationale for Evaluation and Treatment  Rehabilitation  SUBJECTIVE:                                                                                                                                                                                              SUBJECTIVE STATEMENT: Pt reports he has bilateral neuropathy, is wearing bilateral Blue Rocker AFOs. Inquiring about obtaining a lift at home for fall recovery. Has had two falls in past 51mo once when he was holding a plate and second fall while rolling backwards off stool. Denies injuries with both. [facility] but feels like he could benefit from more.   Pt accompanied by: self  PERTINENT HISTORY: B neuropathy with B AFOs, Ashma, skin CA, sleep apnea with CPAP, morbid obesity, HLD, OA, R fib fx (2020) after fall on concrete. He was diagnosed with idiopathic neuropathy in 2013, originally seen by a spine doctor who didn't find anything wrong with his spine besides mild disc disease. He is having more pain in his spine recently going down his right leg that will get better with changing positions. Usually happens after sleeping in the morning. Has not tried PT for this problem. He says he first started having neuropathy symptoms in 2008. [place] [employer] had trouble walking down a hill after a long walk to the ceremony. At that time he figured it was related to his knees. He went to therapy for this but the therapist noticed he was having some neuropathy symptoms and he was sent for evaluation. He describes both numbness and weakness. He says he hasn't completely lost feeling in either foot. He did get AFOs about 5 years ago. His lower legs are the biggest problems, his quadriceps seem to be strong.   PAIN:  Are you having pain? No  PRECAUTIONS: Fall  WEIGHT BEARING RESTRICTIONS No  FALLS: Has patient fallen in last 6  months? Yes. Number of falls 2  LIVING ENVIRONMENT: Lives with: lives with  their spouse Lives in: House/apartment Stairs: Yes: External: 2 steps; can reach both Has following equipment at home: Gilford Rile - 2 wheeled, Wheelchair (power), Grab bars, and bilateral AFOs  PLOF: Independent with basic ADLs and needs assistance w/donning AFOs   PATIENT GOALS "Maintaining Balance"  OBJECTIVE:   COGNITION: Overall cognitive status: Within functional limits for tasks assessed   SENSATION: Bilateral peripheral neuropathy in feet   EDEMA: Pt reports occasional swelling in B ankles    POSTURE: rounded shoulders, forward head, and posterior pelvic tilt   LOWER EXTREMITY MMT:  Tested in seated position   MMT Right Eval Left Eval  Hip flexion 5 5  Hip extension    Hip abduction 5 5  Hip adduction 5 5  Hip internal rotation    Hip external rotation    Knee flexion 5 5  Knee extension 5 5  Ankle dorsiflexion AFO AFO  Ankle plantarflexion    Ankle inversion    Ankle eversion    (Blank rows = not tested)  BED MOBILITY:  Independent per pt   TRANSFERS: Assistive device utilized: None  Sit to stand: SBA Stand to sit: SBA Comments: Heavy reliance on BUEs and bracing against back of chair. Pt frequently gets L AFO caught onto seat of chair while trying to stand, resulting in retropulsion   GAIT: Gait pattern: step through pattern, decreased step length- Right, decreased step length- Left, decreased stride length, decreased hip/knee flexion- Right, decreased hip/knee flexion- Left, decreased ankle dorsiflexion- Right, decreased ankle dorsiflexion- Left, lateral hip instability, wide BOS, poor foot clearance- Right, and poor foot clearance- Left Distance walked: Various clinic distances  Assistive device utilized:  bilateral blue rocker AFOs Level of assistance: Modified independence Comments: Pt demonstrates wide BOS and slow cadence, no LOB noted   FUNCTIONAL TESTs:   [facility] PT Assessment - 01/25/22 1352       Transfers   Five time sit to stand comments   29.56s w/BUE support, heavy bracing against back of chair             TODAY'S TREATMENT:  Next Session    PATIENT EDUCATION: Education details: Eval Findings, POC; looked up chair lift that pt is considering to purchase for use at home and advised him to purchase if it provides him peace of mind at home. Discussed potential for aquatic therapy and provided pt w/address for [employer]  Person educated: Patient Education method: Customer service manager Education comprehension: verbalized understanding   HOME EXERCISE PROGRAM: To be established next session     GOALS: Goals reviewed with patient? Yes  SHORT TERM GOALS: Target date: 02/15/2022  Pt will be independent with initial HEP for improved strength, balance, transfers and gait.  Baseline: not established on eval  Goal status: INITIAL  2.  Berg to be performed and STG/LTG written Baseline:  Goal status: INITIAL  3.  10MWT to be performed and STG/LTG written  Baseline:  Goal status: INITIAL  4.  Pt will improve 5 x STS to less than or equal to 25 seconds w/BUE support and reduced bracing on chair to demonstrate improved functional strength and transfer efficiency.   Baseline: 29.56s w/BUE support and heavy bracing against chair  Goal status: INITIAL  5.  MCTSIB to be performed and STG/LTG written Baseline:  Goal status: INITIAL  LONG TERM GOALS: Target date: 03/08/2022  Pt will be independent with final HEP for improved strength, balance,  transfers and gait.  Baseline:  Goal status: INITIAL  2.  MCTSIB goal  Baseline:  Goal status: INITIAL  3.  Berg goal  Baseline:  Goal status: INITIAL  4.  10MWT goal Baseline:  Goal status: INITIAL  5.  Pt will improve 5 x STS to less than or equal to 21 seconds w/BUE support to demonstrate improved functional strength and transfer efficiency.   Baseline: 29.56s w/BUE support  Goal status: INITIAL  ASSESSMENT:  CLINICAL IMPRESSION: Patient is a 81  year old male referred to Neuro O[facility]T for peripheral neuropathy. Pt's PMH is significant for: B neuropathy with B AFOs, Ashma, skin CA, sleep apnea with CPAP, morbid obesity, HLD, OA, R fib fx (2020) after fall on concrete. The following deficits were present during the exam: impaired gait kinematics 2/2 peripheral neuropathy,impaired body mechanics, decreased sensation and decreased endurance. Based on 5x STS and fall history, pt is an incr risk for falls. Will further assess balance next session. Pt would benefit from skilled PT to address these impairments and functional limitations to maximize functional mobility independence   OBJECTIVE IMPAIRMENTS Abnormal gait, decreased activity tolerance, decreased balance, decreased endurance, decreased mobility, difficulty walking, impaired sensation, improper body mechanics, and pain.   ACTIVITY LIMITATIONS carrying, lifting, bending, squatting, stairs, transfers, reach over head, and locomotion level  PARTICIPATION LIMITATIONS: meal prep, cleaning, laundry, driving, shopping, community activity, occupation, and yard work  PERSONAL FACTORS Age, Fitness, Past/current experiences, and 1-2 comorbidities: severe OA in B shoulders and bilateral peripheral neuropathy  are also affecting patient's functional outcome.   REHAB POTENTIAL: Fair due to time since onset of neuropathy and sedentary lifestyle  CLINICAL DECISION MAKING: Stable/uncomplicated  EVALUATION COMPLEXITY: Low  PLAN: PT FREQUENCY: 2x/week  PT DURATION: 6 weeks  PLANNED INTERVENTIONS: Therapeutic exercises, Therapeutic activity, Neuromuscular re-education, Balance training, Gait training, Patient/Family education, Self Care, Stair training, Orthotic/Fit training, DME instructions, Aquatic Therapy, and Re-evaluation  PLAN FOR NEXT SESSION: Berg, 10MWT, MCTSIB, establish HEP for improved transfers and balance deficits. Aquatic therapy?   Cruzita Lederer Genice Kimberlin, PT, DPT 01/25/2022, 3:17  PM

## 2022-01-31 ENCOUNTER — Ambulatory Visit: Payer: Medicare PPO | Admitting: Physical Therapy

## 2022-01-31 DIAGNOSIS — M792 Neuralgia and neuritis, unspecified: Secondary | ICD-10-CM | POA: Diagnosis not present

## 2022-01-31 DIAGNOSIS — M79604 Pain in right leg: Secondary | ICD-10-CM | POA: Diagnosis not present

## 2022-01-31 DIAGNOSIS — M545 Low back pain, unspecified: Secondary | ICD-10-CM | POA: Diagnosis not present

## 2022-01-31 DIAGNOSIS — M6281 Muscle weakness (generalized): Secondary | ICD-10-CM | POA: Diagnosis not present

## 2022-01-31 DIAGNOSIS — R262 Difficulty in walking, not elsewhere classified: Secondary | ICD-10-CM

## 2022-01-31 DIAGNOSIS — R2689 Other abnormalities of gait and mobility: Secondary | ICD-10-CM | POA: Diagnosis not present

## 2022-01-31 NOTE — Therapy (Signed)
OUTPATIENT PHYSICAL THERAPY NEURO TREATMENT   Patient Name: Albert Barr MRN: [id number] DOB:May 28, 1941, 81 y.o., male Today's Date: 01/31/2022   PCP: Reynold Bowen, MD  REFERRING PROVIDER: Stefanie Libel, MD    PT End of Session - 01/31/22 1409     Visit Number 2    Number of Visits 13   Plus eval   Date for PT Re-Evaluation 03/15/22    Authorization Type [employer]    Authorization Time Period 01/25/22-03/15/22    Authorization - Visit Number 1    Authorization - Number of Visits 13    Progress Note Due on Visit 10    PT Start Time 1405    PT Stop Time 9211    PT Time Calculation (min) 42 min    Equipment Utilized During Treatment Other (comment)   Bilateral Blue Rocker AFOs   Activity Tolerance Patient tolerated treatment well    Behavior During Therapy WFL for tasks assessed/performed             Past Medical History:  Diagnosis Date   Allergy    Anal fissure    Arthritis    shoulder, knees   Asthma    Atrophic gastritis without mention of hemorrhage    Cataract    bilateraly   Esophageal reflux    Family history of gastric cancer    Fibula fracture    hair line fracture   Gastric polyps    history   Hiatal hernia    Hx of adenomatous colonic polyps    Hyperlipidemia    Hypertension    Metaplasia of esophagus 2011   "gastric metaplasia"   Morbid obesity ([place])    Obstructive sleep apnea on CPAP    Other voice and resonance disorders    Peripheral neuropathy    Polyposis syndrome gastric and colonic - attenuated 12/09/2015   Sleep apnea    uses CPAP   Squamous cell skin cancer 04/2018   lesion left forearm   Unspecified asthma(493.90)    Past Surgical History:  Procedure Laterality Date   APPENDECTOMY     CHOLECYSTECTOMY     COLONOSCOPY  08/20/2009 (multiple)   Tubular adenoma   ESOPHAGOGASTRODUODENOSCOPY  08/20/09 (multiple)   [place]  2007   KNEE SURGERY     arthroscopy- both knees   POLYPECTOMY      UPPER GASTROINTESTINAL ENDOSCOPY     Patient Active Problem List   Diagnosis Date Noted   Primary osteoarthritis of both shoulders 01/18/2022   Leg edema, left 03/03/2020   Bilateral swelling of feet and ankles 12/18/2018   Acute right ankle pain 09/18/2018   Foot drop, bilateral 01/25/2018   Morbid obesity with body mass index of 40.0-44.9 in adult [facility]) 12/04/2017   Genetic testing 03/03/2016   Polyposis syndrome gastric and colonic - attenuated 12/09/2015   Pre-operative clearance 11/13/2015   Essential hypertension 11/13/2015   NS (nuclear sclerosis) 03/11/2015   Degenerative arthritis of lumbar spine 09/03/2014   Lumbar and sacral osteoarthritis 09/03/2014   Ocular rosacea 08/19/2014   Incomplete rotator cuff tear 07/22/2014   Conjunctival chalasis 02/12/2014   Dry eye syndrome 02/12/2014   Dry eye 02/12/2014   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   Disease of eyelid 02/12/2014   SPL (spondylolisthesis) 11/27/2013   Family history of gastric cancer 05/16/2013   Family history of cancer of digestive organ 05/16/2013   Imbalance 06/14/2012   Peripheral neuropathic pain 06/14/2012   Neuropathy 06/14/2012   Glaucoma suspect  11/03/2011   Cataract, nuclear 11/03/2011   Posterior vitreous detachment 11/03/2011   Hole, retinal 11/03/2011   Ache in joint 05/26/2011   Gastro-esophageal reflux disease without esophagitis 05/26/2011   Acid reflux 05/26/2011   Hypercholesterolemia 05/26/2011   Hyperlipidemia 05/27/2009   Morbid obesity ([place]) 05/27/2009   GERD 09/11/2008   History of colonic polyps 09/11/2008   Multiple gastric polyps 09/11/2008   H/O disease 09/11/2008   HOARSENESS 09/10/2008   SLEEP APNEA 11/05/2007   ASTHMA 10/12/2007   Airway hyperreactivity 10/12/2007   GASTRITIS, CHRONIC 08/01/2006   AG (atrophic gastritis) 08/01/2006    ONSET DATE: 01/18/2022 (referral)   REFERRING DIAG: M79.2 (ICD-10-CM) - Peripheral neuropathic pain   THERAPY DIAG:  Other  abnormalities of gait and mobility  Difficulty in walking, not elsewhere classified  Rationale for Evaluation and Treatment Rehabilitation  SUBJECTIVE:                                                                                                                                                                                              SUBJECTIVE STATEMENT: Pt reports he is doing well, no new changes to report. Was afraid that his L knee would buckle on him today so brought his trekking pole to session   Pt accompanied by: self  PERTINENT HISTORY: B neuropathy with B AFOs, Ashma, skin CA, sleep apnea with CPAP, morbid obesity, HLD, OA, R fib fx (2020) after fall on concrete. He was diagnosed with idiopathic neuropathy in 2013, originally seen by a spine doctor who didn't find anything wrong with his spine besides mild disc disease. He is having more pain in his spine recently going down his right leg that will get better with changing positions. Usually happens after sleeping in the morning. Has not tried PT for this problem. He says he first started having neuropathy symptoms in 2008. He was at [employer] for [employer] and had trouble walking down a hill after a long walk to the ceremony. At that time he figured it was related to his knees. He went to therapy for this but the therapist noticed he was having some neuropathy symptoms and he was sent for evaluation. He describes both numbness and weakness. He says he hasn't completely lost feeling in either foot. He did get AFOs about 5 years ago. His lower legs are the biggest problems, his quadriceps seem to be strong.   PAIN:  Are you having pain? Yes: NPRS scale: 3/10 Pain location: Bilateral knees and shoulders Pain description: Achy/OA  PRECAUTIONS: Fall  FALLS: Has patient fallen in last 6 months? Yes. Number of falls 2  PLOF: Independent with basic ADLs and needs assistance w/donning AFOs   PATIENT GOALS "Maintaining  Balance"  OBJECTIVE:    SENSATION: Bilateral peripheral neuropathy in feet    TODAY'S SESSION   Ther Act   [facility] PT Assessment - 01/31/22 1417       Ambulation/Gait   Gait velocity 32.8' over 14.64s = 2.24 ft/s   Bilateral blue rocker AFOs and no AD. Limited Secretary/administrator Assessed Yes      Standardized Balance Assessment   Standardized Balance Assessment Berg Balance Test      Berg Balance Test   Sit to Stand Needs minimal aid to stand or to stabilize   Used armrest of chair for immediate standing balance   Standing Unsupported Able to stand 2 minutes with supervision    Sitting with Back Unsupported but Feet Supported on Floor or Stool Able to sit safely and securely 2 minutes    Stand to Sit Controls descent by using hands    Transfers Able to transfer safely, definite need of hands    Standing Unsupported with Eyes Closed Able to stand 10 seconds with supervision    Standing Unsupported with Feet Together Able to place feet together independently and stand for 1 minute with supervision    From Standing, Reach Forward with Outstretched Arm Loses balance while trying/requires external support    From Standing Position, Pick up Object from Floor Unable to try/needs assist to keep balance    From Standing Position, Turn to Look Behind Over each Shoulder Needs supervision when turning   Pt moved head only   Turn 360 Degrees Needs close supervision or verbal cueing    Standing Unsupported, Alternately Place Feet on Step/Stool Needs assistance to keep from falling or unable to try    Standing Unsupported, One Foot in [employer] balance while stepping or standing    Standing on One Leg Unable to try or needs assist to prevent fall    Total Score 22    Berg comment: High fall risk (<36)            Initiated initial HEP for static standing balance and vestibular input (see bolded below).     GAIT: Gait pattern: step through pattern, decreased step  length- Right, decreased step length- Left, decreased stride length, decreased hip/knee flexion- Right, decreased hip/knee flexion- Left, decreased ankle dorsiflexion- Right, decreased ankle dorsiflexion- Left, lateral hip instability, wide BOS, poor foot clearance- Right, and poor foot clearance- Left Distance walked: Various clinic distances  Assistive device utilized:  bilateral blue rocker AFOs and single trekking pole Level of assistance: Modified independence Comments: Pt demonstrates wide BOS and slow cadence, no LOB noted    PATIENT EDUCATION: Education details: Berg outcome results, initial HEP, plan to talk to [place] on Wednesday to set up aquatic therapy  Person educated: Patient Education method: Explanation and Demonstration Education comprehension: verbalized understanding   HOME EXERCISE PROGRAM: Access Code: [email or web address] URL: [email or web address] Date: 01/31/2022 Prepared by: Mickie Bail Tiffny Gemmer  Exercises - Standing in corner with eyes closed   - 1 x daily - 7 x weekly - 3 sets - 15-30 seconds hold - Standing on old pillows in corner with eyes open and feet close together  - 1 x daily - 7 x weekly - 3 sets - 15-30 second hold     GOALS: Goals reviewed with patient? Yes  SHORT TERM GOALS: Target date: 02/15/2022  Pt will be independent with initial  HEP for improved strength, balance, transfers and gait.  Baseline: not established on eval  Goal status: INITIAL  2. Pt will improve Berg score to 26/56 for decreased fall risk  Baseline: 22/56 Goal status: INITIAL  3.  Pt will improve gait velocity to at least 2.29f/s w/LRAD for improved gait efficiency   Baseline: 2.284fs  Goal status: INITIAL  4.  Pt will improve 5 x STS to less than or equal to 25 seconds w/BUE support and reduced bracing on chair to demonstrate improved functional strength and transfer efficiency.   Baseline: 29.56s w/BUE support and heavy bracing against chair  Goal status:  INITIAL  5.  MCTSIB to be performed and STG/LTG written Baseline:  Goal status: INITIAL  LONG TERM GOALS: Target date: 03/08/2022  Pt will be independent with final HEP for improved strength, balance, transfers and gait.  Baseline:  Goal status: INITIAL  2.  MCTSIB goal  Baseline:  Goal status: INITIAL  3. Pt will improve Berg score to 31/56 for decreased fall risk  Baseline: 22/56 Goal status: INITIAL  4.  Pt will improve gait velocity to at least 2.59f60f w/LRAD for improved gait efficiency   Baseline: 2.24 ft/s  Goal status: INITIAL  5.  Pt will improve 5 x STS to less than or equal to 21 seconds w/BUE support to demonstrate improved functional strength and transfer efficiency.   Baseline: 29.56s w/BUE support  Goal status: INITIAL  ASSESSMENT:  CLINICAL IMPRESSION: Emphasis of skilled PT session on balance assessment and establishing initial HEP for balance. Pt scored a 22/56 on Berg, indicative of high fall risk. Noted the greatest difficulty w/rotation and reaching out of BOS. Established initial HEP to target static standing balance and will progress to dynamic balance in next sessions. Continue POC.    OBJECTIVE IMPAIRMENTS Abnormal gait, decreased activity tolerance, decreased balance, decreased endurance, decreased mobility, difficulty walking, impaired sensation, improper body mechanics, and pain.   ACTIVITY LIMITATIONS carrying, lifting, bending, squatting, stairs, transfers, reach over head, and locomotion level  PARTICIPATION LIMITATIONS: meal prep, cleaning, laundry, driving, shopping, community activity, occupation, and yard work  PERSONAL FACTORS Age, Fitness, Past/current experiences, and 1-2 comorbidities: severe OA in B shoulders and bilateral peripheral neuropathy  are also affecting patient's functional outcome.   REHAB POTENTIAL: Fair due to time since onset of neuropathy and sedentary lifestyle  CLINICAL DECISION MAKING:  Stable/uncomplicated  EVALUATION COMPLEXITY: Low  PLAN: PT FREQUENCY: 2x/week  PT DURATION: 6 weeks  PLANNED INTERVENTIONS: Therapeutic exercises, Therapeutic activity, Neuromuscular re-education, Balance training, Gait training, Patient/Family education, Self Care, Stair training, Orthotic/Fit training, DME instructions, Aquatic Therapy, and Re-evaluation  PLAN FOR NEXT SESSION: MCTSIB, add to HEP for improved transfers and balance deficits. Aquatic therapy? Sit <>stands, any dynamic balance/endurance, compensation strategies for proprioception deficits, hip strategy techniques    JanCruzita Ledereraster, PT, DPT 01/31/2022, 2:49 PM

## 2022-02-02 ENCOUNTER — Encounter: Payer: Self-pay | Admitting: Physical Therapy

## 2022-02-02 ENCOUNTER — Ambulatory Visit: Payer: Medicare PPO | Admitting: Physical Therapy

## 2022-02-02 DIAGNOSIS — R262 Difficulty in walking, not elsewhere classified: Secondary | ICD-10-CM | POA: Diagnosis not present

## 2022-02-02 DIAGNOSIS — M79604 Pain in right leg: Secondary | ICD-10-CM | POA: Diagnosis not present

## 2022-02-02 DIAGNOSIS — R2689 Other abnormalities of gait and mobility: Secondary | ICD-10-CM | POA: Diagnosis not present

## 2022-02-02 DIAGNOSIS — M545 Low back pain, unspecified: Secondary | ICD-10-CM | POA: Diagnosis not present

## 2022-02-02 DIAGNOSIS — M6281 Muscle weakness (generalized): Secondary | ICD-10-CM | POA: Diagnosis not present

## 2022-02-02 DIAGNOSIS — M792 Neuralgia and neuritis, unspecified: Secondary | ICD-10-CM | POA: Diagnosis not present

## 2022-02-02 NOTE — Therapy (Signed)
OUTPATIENT PHYSICAL THERAPY NEURO TREATMENT   Patient Name: Albert Barr MRN: [id number] DOB:Sep 01, 1940, 81 y.o., male Today's Date: 02/02/2022   PCP: Reynold Bowen, MD  REFERRING PROVIDER: Stefanie Libel, MD    02/02/22 1424  PT Visits / Re-Eval  Visit Number 3  Number of Visits 13 (Plus eval)  Date for PT Re-Evaluation 03/15/22  Authorization  Authorization Type [employer]  Authorization Time Period 01/25/22-03/15/22  Authorization - Visit Number 2  Authorization - Number of Visits 13  Progress Note Due on Visit 10  PT Time Calculation  PT Start Time 1409 (pt running late for appt)  PT Stop Time 1445  PT Time Calculation (min) 36 min  PT - End of Session  Equipment Utilized During Treatment Other (comment) (Bilateral Blue [place]er AFOs)  Activity Tolerance Patient tolerated treatment well  Behavior During Therapy WFL for tasks assessed/performed      Past Medical History:  Diagnosis Date   Allergy    Anal fissure    Arthritis    shoulder, knees   Asthma    Atrophic gastritis without mention of hemorrhage    Cataract    bilateraly   Esophageal reflux    Family history of gastric cancer    Fibula fracture    hair line fracture   Gastric polyps    history   Hiatal hernia    Hx of adenomatous colonic polyps    Hyperlipidemia    Hypertension    Metaplasia of esophagus 2011   "gastric metaplasia"   Morbid obesity ([place])    Obstructive sleep apnea on CPAP    Other voice and resonance disorders    Peripheral neuropathy    Polyposis syndrome gastric and colonic - attenuated 12/09/2015   Sleep apnea    uses CPAP   Squamous cell skin cancer 04/2018   lesion left forearm   Unspecified asthma(493.90)    Past Surgical History:  Procedure Laterality Date   APPENDECTOMY     CHOLECYSTECTOMY     COLONOSCOPY  08/20/2009 (multiple)   Tubular adenoma   ESOPHAGOGASTRODUODENOSCOPY  08/20/09 (multiple)   [place]  2007   KNEE  SURGERY     arthroscopy- both knees   POLYPECTOMY     UPPER GASTROINTESTINAL ENDOSCOPY     Patient Active Problem List   Diagnosis Date Noted   Primary osteoarthritis of both shoulders 01/18/2022   Leg edema, left 03/03/2020   Bilateral swelling of feet and ankles 12/18/2018   Acute right ankle pain 09/18/2018   Foot drop, bilateral 01/25/2018   Morbid obesity with body mass index of 40.0-44.9 in adult [facility]) 12/04/2017   Genetic testing 03/03/2016   Polyposis syndrome gastric and colonic - attenuated 12/09/2015   Pre-operative clearance 11/13/2015   Essential hypertension 11/13/2015   NS (nuclear sclerosis) 03/11/2015   Degenerative arthritis of lumbar spine 09/03/2014   Lumbar and sacral osteoarthritis 09/03/2014   Ocular rosacea 08/19/2014   Incomplete rotator cuff tear 07/22/2014   Conjunctival chalasis 02/12/2014   Dry eye syndrome 02/12/2014   Dry eye 02/12/2014   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   Disease of eyelid 02/12/2014   SPL (spondylolisthesis) 11/27/2013   Family history of gastric cancer 05/16/2013   Family history of cancer of digestive organ 05/16/2013   Imbalance 06/14/2012   Peripheral neuropathic pain 06/14/2012   Neuropathy 06/14/2012   Glaucoma suspect 11/03/2011   Cataract, nuclear 11/03/2011   Posterior vitreous detachment 11/03/2011   Hole, retinal 11/03/2011   Ache in  joint 05/26/2011   Gastro-esophageal reflux disease without esophagitis 05/26/2011   Acid reflux 05/26/2011   Hypercholesterolemia 05/26/2011   Hyperlipidemia 05/27/2009   Morbid obesity ([place]) 05/27/2009   GERD 09/11/2008   History of colonic polyps 09/11/2008   Multiple gastric polyps 09/11/2008   H/O disease 09/11/2008   HOARSENESS 09/10/2008   SLEEP APNEA 11/05/2007   ASTHMA 10/12/2007   Airway hyperreactivity 10/12/2007   GASTRITIS, CHRONIC 08/01/2006   AG (atrophic gastritis) 08/01/2006    ONSET DATE: 01/18/2022 (referral)   REFERRING DIAG: M79.2 (ICD-10-CM) -  Peripheral neuropathic pain   THERAPY DIAG:  Other abnormalities of gait and mobility  Difficulty in walking, not elsewhere classified  Low back pain radiating to right lower extremity  Muscle weakness (generalized)  Rationale for Evaluation and Treatment Rehabilitation  PERTINENT HISTORY: B neuropathy with B AFOs, [place], skin CA, sleep apnea with CPAP, morbid obesity, HLD, OA, R fib fx (2020) after fall on concrete. He was diagnosed with idiopathic neuropathy in 2013, originally seen by a spine doctor who didn't find anything wrong with his spine besides mild disc disease. He is having more pain in his spine recently going down his right leg that will get better with changing positions. Usually happens after sleeping in the morning. Has not tried PT for this problem. He says he first started having neuropathy symptoms in 2008. He was at [employer] for [employer] and had trouble walking down a hill after a long walk to the ceremony. At that time he figured it was related to his knees. He went to therapy for this but the therapist noticed he was having some neuropathy symptoms and he was sent for evaluation. He describes both numbness and weakness. He says he hasn't completely lost feeling in either foot. He did get AFOs about 5 years ago. His lower legs are the biggest problems, his quadriceps seem to be strong.   PRECAUTIONS: Fall    PATIENT GOALS: "Maintaining Balance"   SUBJECTIVE: No new complaints. No falls. Ready to set aquatic schedule today.                                                                                                                                                                                              Pt accompanied by: self  PAIN:  Are you having pain? Yes: NPRS scale: 3-4/10 Pain location: Bilateral knees and shoulders Pain description: Achy/OA    OBJECTIVE:    SENSATION: Bilateral peripheral neuropathy in feet   TODAY'S SESSION   BALANCE:  MCTSIB Condition 1: Eyes open, Firm surface Trial 1 30 seconds Trial 2 - Trial  3 - 2Condition 2: Eyes closed, Firm surface Trial 1 10.78 seconds Trial 2 28.47 seconds Trial 3 30 seconds 3Condition 3: Eyes open, Foam surface Trial 1 30 seconds with incr sway Trial 2 - Trial 3 - 4Condition 4: Eyes closed, Foam surface Trial 1 3.60 seconds Trial 2 9.57 seconds Trial 3 10.35 seconds   GAIT: Gait pattern: step through pattern, decreased step length- Right, decreased step length- Left, decreased stride length, decreased hip/knee flexion- Right, decreased hip/knee flexion- Left, decreased ankle dorsiflexion- Right, decreased ankle dorsiflexion- Left, lateral hip instability, wide BOS, poor foot clearance- Right, and poor foot clearance- Left Distance walked: Various clinic distances  Assistive device utilized:  bilateral blue rocker AFOs and Hurry cane with leaving only Level of assistance: SBA and CGA Comments: Pt demonstrates wide BOS and slow cadence, no LOB noted. Pt using no device to enter clinic from lobby with unsteady gait. Did have Hurry cane folded up with him. Used cane to leave session.    SELF CARE: Worked with pt to set up aquatic therapy sessions around his schedule. Provided information sheet for aquatic therapy and addressed all questions/concerns.     PATIENT EDUCATION: Education details: aquatic therapy info/appts, results of balance test Person educated: Patient Education method: Explanation and Demonstration Education comprehension: verbalized understanding   HOME EXERCISE PROGRAM: Access Code: [email or web address] URL: [email or web address] Date: 01/31/2022 Prepared by: Mickie Bail Plaster  Exercises - Standing in corner with eyes closed   - 1 x daily - 7 x weekly - 3 sets - 15-30 seconds hold - Standing on old pillows in corner with eyes open and feet close together  - 1 x daily - 7 x weekly - 3 sets - 15-30 second hold     GOALS: Goals  reviewed with patient? Yes  SHORT TERM GOALS: Target date: 02/15/2022  Pt will be independent with initial HEP for improved strength, balance, transfers and gait.  Baseline: not established on eval  Goal status: INITIAL  2. Pt will improve Berg score to 26/56 for decreased fall risk  Baseline: 22/56 Goal status: INITIAL  3.  Pt will improve gait velocity to at least 2.30f/s w/LRAD for improved gait efficiency  Baseline: 2.26fs  Goal status: INITIAL  4.  Pt will improve 5 x STS to less than or equal to 25 seconds w/BUE support and reduced bracing on chair to demonstrate improved functional strength and transfer efficiency.  Baseline: 29.56s w/BUE support and heavy bracing against chair  Goal status: INITIAL  5.  MCTSIB to be performed and STG/LTG written Baseline:  Goal status: INITIAL  LONG TERM GOALS: Target date: 03/08/2022  Pt will be independent with final HEP for improved strength, balance, transfers and gait. Baseline:  Goal status: INITIAL  2.  MCTSIB goal  Baseline:  Goal status: INITIAL  3. Pt will improve Berg score to 31/56 for decreased fall risk Baseline: 22/56 Goal status: INITIAL  4.  Pt will improve gait velocity to at least 2.49f52f w/LRAD for improved gait efficiency   Baseline: 2.24 ft/s  Goal status: INITIAL  5.  Pt will improve 5 x STS to less than or equal to 21 seconds w/BUE support to demonstrate improved functional strength and transfer efficiency.  Baseline: 29.56s w/BUE support  Goal status: INITIAL    ASSESSMENT:  CLINICAL IMPRESSION: Today's skilled session continued to focus on balance with MCTSIB performed with PT to set goals. Also addressed getting pt set up for aquatic therapy sessions with all questions/concerns addressed. The pt is progressing  slowly with need for rest breaks during MCTSIB. The pt should benefit from continued PT to progress toward unmet goals.   OBJECTIVE IMPAIRMENTS Abnormal gait, decreased activity  tolerance, decreased balance, decreased endurance, decreased mobility, difficulty walking, impaired sensation, improper body mechanics, and pain.   ACTIVITY LIMITATIONS carrying, lifting, bending, squatting, stairs, transfers, reach over head, and locomotion level  PARTICIPATION LIMITATIONS: meal prep, cleaning, laundry, driving, shopping, community activity, occupation, and yard work  PERSONAL FACTORS Age, Fitness, Past/current experiences, and 1-2 comorbidities: severe OA in B shoulders and bilateral peripheral neuropathy  are also affecting patient's functional outcome.   REHAB POTENTIAL: Fair due to time since onset of neuropathy and sedentary lifestyle  CLINICAL DECISION MAKING: Stable/uncomplicated  EVALUATION COMPLEXITY: Low  PLAN: PT FREQUENCY: 2x/week  PT DURATION: 6 weeks  PLANNED INTERVENTIONS: Therapeutic exercises, Therapeutic activity, Neuromuscular re-education, Balance training, Gait training, Patient/Family education, Self Care, Stair training, Orthotic/Fit training, DME instructions, Aquatic Therapy, and Re-evaluation  PLAN FOR NEXT SESSION:  add to HEP for improved transfers and balance deficits. Sit <>stands, any dynamic balance/endurance, compensation strategies for proprioception deficits, hip strategy techniques     Willow Ora, PTA, [facility] [address], [place], [place] [address] [phone] 02/02/22, 2:25 PM

## 2022-02-02 NOTE — Patient Instructions (Signed)
  Aquatic Therapy: What to Expect!  Where:  [facility] [address] [place], [place]  [address] [phone]  NOTE:  You will receive an automated phone message reminding you of your appointment and it will say the appointment is at the Rehab Center on [address]  We are working to fix this- just know that you will meet us at the pool!  How to Prepare: Please make sure you drink 8 ounces of water about one hour prior to your pool session A caregiver MUST attend the entire session with the patient.  The caregiver will be responsible for assisting with dressing as well as any toileting needs.  If the patient will be doing a home program this should likely be the person who will assist as well.  Patients must wear either their street shoes or pool shoes until they are ready to enter the pool with the therapist.  Patients must also wear either street shoes or pool shoes once exiting the pool to walk to the locker room.  This will helps us prevent slips and falls.  Please arrive 15 minutes early to prepare for your pool therapy session Sign in at the front desk on the clipboard marked for [place] You may use the locker rooms on your right and then enter directly into the recreation pool (NOT the competition pool) Please make sure to attend to any toileting needs prior to entering the pool Please be dressed in your swim suit and on the pool deck at least 5 minutes before your appointment Once on the pool deck your therapist will ask you to sign the Patient  Consent and Assignment of Benefits form Your therapist may take your blood pressure prior to, during and after your session if indicated  About the pool  and parking: Entering the pool Your therapist will assist you; there are 2 ways to enter:  stairs with railings or with a chair lift.   Your therapist will determine the most appropriate way for you. Water temperature is usually between 86-87 degrees There  may be other swimmers in the pool at the same time Parking is free.   Contact Info:     Appointments: [facility]  All sessions are 45 minutes   [address]  [address]     Please call the [facility] if   [place], [place]   [address]    you need to cancel or reschedule an appointment.  [phone]       

## 2022-02-07 ENCOUNTER — Ambulatory Visit: Payer: Medicare PPO | Admitting: Physical Therapy

## 2022-02-09 ENCOUNTER — Ambulatory Visit: Payer: Medicare PPO | Admitting: Physical Therapy

## 2022-02-11 ENCOUNTER — Ambulatory Visit: Payer: Medicare PPO | Admitting: Physical Therapy

## 2022-02-11 VITALS — BP 161/74 | HR 97

## 2022-02-11 DIAGNOSIS — R2689 Other abnormalities of gait and mobility: Secondary | ICD-10-CM

## 2022-02-11 DIAGNOSIS — E785 Hyperlipidemia, unspecified: Secondary | ICD-10-CM | POA: Diagnosis not present

## 2022-02-11 DIAGNOSIS — G609 Hereditary and idiopathic neuropathy, unspecified: Secondary | ICD-10-CM | POA: Diagnosis not present

## 2022-02-11 DIAGNOSIS — R262 Difficulty in walking, not elsewhere classified: Secondary | ICD-10-CM

## 2022-02-11 DIAGNOSIS — J45909 Unspecified asthma, uncomplicated: Secondary | ICD-10-CM | POA: Diagnosis not present

## 2022-02-11 DIAGNOSIS — M792 Neuralgia and neuritis, unspecified: Secondary | ICD-10-CM | POA: Diagnosis not present

## 2022-02-11 DIAGNOSIS — G4730 Sleep apnea, unspecified: Secondary | ICD-10-CM | POA: Diagnosis not present

## 2022-02-11 DIAGNOSIS — M545 Low back pain, unspecified: Secondary | ICD-10-CM | POA: Diagnosis not present

## 2022-02-11 DIAGNOSIS — R82998 Other abnormal findings in urine: Secondary | ICD-10-CM | POA: Diagnosis not present

## 2022-02-11 DIAGNOSIS — M79604 Pain in right leg: Secondary | ICD-10-CM | POA: Diagnosis not present

## 2022-02-11 DIAGNOSIS — M6281 Muscle weakness (generalized): Secondary | ICD-10-CM

## 2022-02-11 DIAGNOSIS — E291 Testicular hypofunction: Secondary | ICD-10-CM | POA: Diagnosis not present

## 2022-02-11 DIAGNOSIS — Z0000 Encounter for general adult medical examination without abnormal findings: Secondary | ICD-10-CM | POA: Diagnosis not present

## 2022-02-11 DIAGNOSIS — I10 Essential (primary) hypertension: Secondary | ICD-10-CM | POA: Diagnosis not present

## 2022-02-11 DIAGNOSIS — D519 Vitamin B12 deficiency anemia, unspecified: Secondary | ICD-10-CM | POA: Diagnosis not present

## 2022-02-11 NOTE — Therapy (Signed)
OUTPATIENT PHYSICAL THERAPY NEURO TREATMENT   Patient Name: Albert Barr MRN: [id number] DOB:11-04-1940, 81 y.o., male Today's Date: 02/11/2022   PCP: Reynold Bowen, MD  REFERRING PROVIDER: Stefanie Libel, MD    PT End of Session - 02/11/22 [phone]     Visit Number 4    Number of Visits 13   Plus eval   Date for PT Re-Evaluation 03/15/22    Authorization Type [employer]    Authorization Time Period 01/25/22-03/15/22    Authorization - Visit Number 2    Authorization - Number of Visits 13    Progress Note Due on Visit 10    PT Start Time 0935   Pt arrived late   PT Stop Time 1013    PT Time Calculation (min) 38 min    Equipment Utilized During Treatment Other (comment)   Bilateral Blue Rocker AFOs   Activity Tolerance Patient tolerated treatment well    Behavior During Therapy WFL for tasks assessed/performed              Past Medical History:  Diagnosis Date   Allergy    Anal fissure    Arthritis    shoulder, knees   Asthma    Atrophic gastritis without mention of hemorrhage    Cataract    bilateraly   Esophageal reflux    Family history of gastric cancer    Fibula fracture    hair line fracture   Gastric polyps    history   Hiatal hernia    Hx of adenomatous colonic polyps    Hyperlipidemia    Hypertension    Metaplasia of esophagus 2011   "gastric metaplasia"   Morbid obesity ([place])    Obstructive sleep apnea on CPAP    Other voice and resonance disorders    Peripheral neuropathy    Polyposis syndrome gastric and colonic - attenuated 12/09/2015   Sleep apnea    uses CPAP   Squamous cell skin cancer 04/2018   lesion left forearm   Unspecified asthma(493.90)    Past Surgical History:  Procedure Laterality Date   APPENDECTOMY     CHOLECYSTECTOMY     COLONOSCOPY  08/20/2009 (multiple)   Tubular adenoma   ESOPHAGOGASTRODUODENOSCOPY  08/20/09 (multiple)   [place]  2007   KNEE SURGERY     arthroscopy- both knees    POLYPECTOMY     UPPER GASTROINTESTINAL ENDOSCOPY     Patient Active Problem List   Diagnosis Date Noted   Primary osteoarthritis of both shoulders 01/18/2022   Leg edema, left 03/03/2020   Bilateral swelling of feet and ankles 12/18/2018   Acute right ankle pain 09/18/2018   Foot drop, bilateral 01/25/2018   Morbid obesity with body mass index of 40.0-44.9 in adult [facility]) 12/04/2017   Genetic testing 03/03/2016   Polyposis syndrome gastric and colonic - attenuated 12/09/2015   Pre-operative clearance 11/13/2015   Essential hypertension 11/13/2015   NS (nuclear sclerosis) 03/11/2015   Degenerative arthritis of lumbar spine 09/03/2014   Lumbar and sacral osteoarthritis 09/03/2014   Ocular rosacea 08/19/2014   Incomplete rotator cuff tear 07/22/2014   Conjunctival chalasis 02/12/2014   Dry eye syndrome 02/12/2014   Dry eye 02/12/2014   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   Disease of eyelid 02/12/2014   SPL (spondylolisthesis) 11/27/2013   Family history of gastric cancer 05/16/2013   Family history of cancer of digestive organ 05/16/2013   Imbalance 06/14/2012   Peripheral neuropathic pain 06/14/2012   Neuropathy  06/14/2012   Glaucoma suspect 11/03/2011   Cataract, nuclear 11/03/2011   Posterior vitreous detachment 11/03/2011   Hole, retinal 11/03/2011   Ache in joint 05/26/2011   Gastro-esophageal reflux disease without esophagitis 05/26/2011   Acid reflux 05/26/2011   Hypercholesterolemia 05/26/2011   Hyperlipidemia 05/27/2009   Morbid obesity ([place]) 05/27/2009   GERD 09/11/2008   History of colonic polyps 09/11/2008   Multiple gastric polyps 09/11/2008   H/O disease 09/11/2008   HOARSENESS 09/10/2008   SLEEP APNEA 11/05/2007   ASTHMA 10/12/2007   Airway hyperreactivity 10/12/2007   GASTRITIS, CHRONIC 08/01/2006   AG (atrophic gastritis) 08/01/2006    ONSET DATE: 01/18/2022 (referral)   REFERRING DIAG: M79.2 (ICD-10-CM) - Peripheral neuropathic pain   THERAPY  DIAG:  Other abnormalities of gait and mobility  Difficulty in walking, not elsewhere classified  Muscle weakness (generalized)  Rationale for Evaluation and Treatment Rehabilitation  SUBJECTIVE:                                                                                                                                                                                              SUBJECTIVE STATEMENT: Pt reports he is doing well, was able to hold standing on foam at home for >30s. Cleaned his attic this week and had no issues.   Pt accompanied by: self  PERTINENT HISTORY: B neuropathy with B AFOs, Ashma, skin CA, sleep apnea with CPAP, morbid obesity, HLD, OA, R fib fx (2020) after fall on concrete. He was diagnosed with idiopathic neuropathy in 2013, originally seen by a spine doctor who didn't find anything wrong with his spine besides mild disc disease. He is having more pain in his spine recently going down his right leg that will get better with changing positions. Usually happens after sleeping in the morning. Has not tried PT for this problem. He says he first started having neuropathy symptoms in 2008. He was at [employer] for [employer] and had trouble walking down a hill after a long walk to the ceremony. At that time he figured it was related to his knees. He went to therapy for this but the therapist noticed he was having some neuropathy symptoms and he was sent for evaluation. He describes both numbness and weakness. He says he hasn't completely lost feeling in either foot. He did get AFOs about 5 years ago. His lower legs are the biggest problems, his quadriceps seem to be strong.   PAIN:  Are you having pain? Yes: NPRS scale: 3/10 Pain location: Bilateral knees and shoulders Pain description: Achy/OA  PRECAUTIONS: Fall  VITALS Vitals:   02/11/22 0945 02/11/22  0949  BP: 136/73 (!) 161/74  Pulse: 88 97     FALLS: Has patient fallen in last 6 months? Yes.  Number of falls 2  PLOF: Independent with basic ADLs and needs assistance w/donning AFOs   PATIENT GOALS "Maintaining Balance"  OBJECTIVE:    SENSATION: Bilateral peripheral neuropathy in feet    TODAY'S SESSION   Self-care/home management  Assessed pt's vitals as he reports he is dizzy when first standing in the morning (see above). Encouraged pt to take his time, perform seated marches/ankle pumps and drink a glass of water before attempting to standing in the morning   NMR  -In corner on AirEx w/chair placed to L side, alt cone taps for improved BLE coordination, dynamic balance and single leg stability. Progressed to double cone taps for added SLS time. Pt required unilateral UE support on chair throughout and frequently lost balance posteriorly into wall.  -Per pt request, standing on Airex w/normal BOS and EO, x30s and x60s. Progressed to EC, but pt only able to stand for 2-3s at a time. Added to HEP (see bolded below).    Therapeutic Activity  Provided handout for aquatic therapy, as pt lost his from last week. All pt's questions regarding aquatic therapy addressed.      GAIT: Gait pattern: step through pattern, decreased step length- Right, decreased step length- Left, decreased stride length, decreased hip/knee flexion- Right, decreased hip/knee flexion- Left, decreased ankle dorsiflexion- Right, decreased ankle dorsiflexion- Left, lateral hip instability, wide BOS, poor foot clearance- Right, and poor foot clearance- Left Distance walked: Various clinic distances  Assistive device utilized:  bilateral blue rocker AFOs Level of assistance: Modified independence Comments: Pt demonstrates wide BOS and slow cadence, no LOB noted. Pt ambulated into session without trekking pole and left session using foldable trekking pole    PATIENT EDUCATION: Education details: Updates to HEP, info on aquatic therapy Person educated: Patient Education method: Holiday representative Education comprehension: verbalized understanding   HOME EXERCISE PROGRAM: Access Code: [email or web address] URL: [email or web address] Date: 02/11/2022 Prepared by: Mickie Bail Wladyslaw Henrichs  Exercises - Standing on foam in corner with eyes closed   - 1 x daily - 7 x weekly - 3 sets - 15-30 seconds hold - Standing on old pillows in corner with eyes open and feet close together  - 1 x daily - 7 x weekly - 3 sets - 15-30 second hold - Standing in corner with eyes closed and head turns  - 1 x daily - 7 x weekly - 3 sets - 10 reps    GOALS: Goals reviewed with patient? Yes  SHORT TERM GOALS: Target date: 02/15/2022  Pt will be independent with initial HEP for improved strength, balance, transfers and gait.  Baseline: not established on eval  Goal status: INITIAL  2. Pt will improve Berg score to 26/56 for decreased fall risk  Baseline: 22/56 Goal status: INITIAL  3.  Pt will improve gait velocity to at least 2.25f/s w/LRAD for improved gait efficiency   Baseline: 2.227fs  Goal status: INITIAL  4.  Pt will improve 5 x STS to less than or equal to 25 seconds w/BUE support and reduced bracing on chair to demonstrate improved functional strength and transfer efficiency.   Baseline: 29.56s w/BUE support and heavy bracing against chair  Goal status: INITIAL  5.  Pt will improve condition 4 of MCTSIB to >/= 15 seconds for improved dynamic balance  Baseline: 10s  Goal status: REVISED  LONG TERM GOALS: Target date:  03/08/2022  Pt will be independent with final HEP for improved strength, balance, transfers and gait.  Baseline:  Goal status: INITIAL  2.  Pt will improve condition 4 of MCTSIB to >/= 20s for improved dynamic stability w/EC Baseline: 10s  Goal status: REVISED  3. Pt will improve Berg score to 31/56 for decreased fall risk  Baseline: 22/56 Goal status: INITIAL  4.  Pt will improve gait velocity to at least 2.73f/s w/LRAD for improved gait efficiency    Baseline: 2.24 ft/s  Goal status: INITIAL  5.  Pt will improve 5 x STS to less than or equal to 21 seconds w/BUE support to demonstrate improved functional strength and transfer efficiency.   Baseline: 29.56s w/BUE support  Goal status: INITIAL  ASSESSMENT:  CLINICAL IMPRESSION: Emphasis of skilled PT session on dynamic standing balance and BLE coordination. Pt continues to demonstrate difficulty w/stabilizing on unlevel surfaces and relies heavily on UE support and vision for balance. Updated HEP to incorporate balance w/EC. Continue POC.    OBJECTIVE IMPAIRMENTS Abnormal gait, decreased activity tolerance, decreased balance, decreased endurance, decreased mobility, difficulty walking, impaired sensation, improper body mechanics, and pain.   ACTIVITY LIMITATIONS carrying, lifting, bending, squatting, stairs, transfers, reach over head, and locomotion level  PARTICIPATION LIMITATIONS: meal prep, cleaning, laundry, driving, shopping, community activity, occupation, and yard work  PERSONAL FACTORS Age, Fitness, Past/current experiences, and 1-2 comorbidities: severe OA in B shoulders and bilateral peripheral neuropathy  are also affecting patient's functional outcome.   REHAB POTENTIAL: Fair due to time since onset of neuropathy and sedentary lifestyle  CLINICAL DECISION MAKING: Stable/uncomplicated  EVALUATION COMPLEXITY: Low  PLAN: PT FREQUENCY: 2x/week  PT DURATION: 6 weeks  PLANNED INTERVENTIONS: Therapeutic exercises, Therapeutic activity, Neuromuscular re-education, Balance training, Gait training, Patient/Family education, Self Care, Stair training, Orthotic/Fit training, DME instructions, Aquatic Therapy, and Re-evaluation  PLAN FOR NEXT SESSION: add to HEP for improved transfers and balance deficits. Sit <>stands, any dynamic balance/endurance, compensation strategies for proprioception deficits, hip strategy techniques    Lulu Hirschmann E Lisa Milian, PT, DPT 02/11/2022, 10:14  AM

## 2022-02-14 ENCOUNTER — Ambulatory Visit: Payer: Medicare PPO | Admitting: Physical Therapy

## 2022-02-15 ENCOUNTER — Encounter: Payer: Self-pay | Admitting: Physical Therapy

## 2022-02-15 ENCOUNTER — Ambulatory Visit: Payer: Medicare PPO | Admitting: Physical Therapy

## 2022-02-15 DIAGNOSIS — D225 Melanocytic nevi of trunk: Secondary | ICD-10-CM | POA: Diagnosis not present

## 2022-02-15 DIAGNOSIS — M545 Low back pain, unspecified: Secondary | ICD-10-CM | POA: Diagnosis not present

## 2022-02-15 DIAGNOSIS — M6281 Muscle weakness (generalized): Secondary | ICD-10-CM | POA: Diagnosis not present

## 2022-02-15 DIAGNOSIS — R262 Difficulty in walking, not elsewhere classified: Secondary | ICD-10-CM | POA: Diagnosis not present

## 2022-02-15 DIAGNOSIS — M79604 Pain in right leg: Secondary | ICD-10-CM | POA: Diagnosis not present

## 2022-02-15 DIAGNOSIS — R2689 Other abnormalities of gait and mobility: Secondary | ICD-10-CM

## 2022-02-15 DIAGNOSIS — L720 Epidermal cyst: Secondary | ICD-10-CM | POA: Diagnosis not present

## 2022-02-15 DIAGNOSIS — M792 Neuralgia and neuritis, unspecified: Secondary | ICD-10-CM | POA: Diagnosis not present

## 2022-02-15 DIAGNOSIS — L821 Other seborrheic keratosis: Secondary | ICD-10-CM | POA: Diagnosis not present

## 2022-02-15 DIAGNOSIS — D485 Neoplasm of uncertain behavior of skin: Secondary | ICD-10-CM | POA: Diagnosis not present

## 2022-02-15 NOTE — Therapy (Signed)
OUTPATIENT PHYSICAL THERAPY NEURO TREATMENT   Patient Name: MATEJ SAPPENFIELD MRN: [id number] DOB:06-06-1941, 81 y.o., male Today's Date: 02/15/2022   PCP: Reynold Bowen, MD  REFERRING PROVIDER: Stefanie Libel, MD     02/15/22 0919  PT Visits / Re-Eval  Visit Number 5  Number of Visits 13 (Plus eval)  Date for PT Re-Evaluation 03/15/22  Authorization  Authorization Type [employer]  Authorization Time Period 01/25/22-03/15/22 13 visits approved  Authorization - Visit Number 5 (8/22- updated number)  Authorization - Number of Visits 13  Progress Note Due on Visit 10  PT Time Calculation  PT Start Time 0920 (started pt early)  PT Stop Time 1010  PT Time Calculation (min) 50 min  PT - End of Session  Equipment Utilized During Treatment Other (comment) (single bar bells, aquatic cuffs)  Activity Tolerance Patient tolerated treatment well  Behavior During Therapy WFL for tasks assessed/performed      Past Medical History:  Diagnosis Date   Allergy    Anal fissure    Arthritis    shoulder, knees   Asthma    Atrophic gastritis without mention of hemorrhage    Cataract    bilateraly   Esophageal reflux    Family history of gastric cancer    Fibula fracture    hair line fracture   Gastric polyps    history   Hiatal hernia    Hx of adenomatous colonic polyps    Hyperlipidemia    Hypertension    Metaplasia of esophagus 2011   "gastric metaplasia"   Morbid obesity ([place])    Obstructive sleep apnea on CPAP    Other voice and resonance disorders    Peripheral neuropathy    Polyposis syndrome gastric and colonic - attenuated 12/09/2015   Sleep apnea    uses CPAP   Squamous cell skin cancer 04/2018   lesion left forearm   Unspecified asthma(493.90)    Past Surgical History:  Procedure Laterality Date   APPENDECTOMY     CHOLECYSTECTOMY     COLONOSCOPY  08/20/2009 (multiple)   Tubular adenoma   ESOPHAGOGASTRODUODENOSCOPY  08/20/09 (multiple)   [place]  2007   KNEE SURGERY     arthroscopy- both knees   POLYPECTOMY     UPPER GASTROINTESTINAL ENDOSCOPY     Patient Active Problem List   Diagnosis Date Noted   Primary osteoarthritis of both shoulders 01/18/2022   Leg edema, left 03/03/2020   Bilateral swelling of feet and ankles 12/18/2018   Acute right ankle pain 09/18/2018   Foot drop, bilateral 01/25/2018   Morbid obesity with body mass index of 40.0-44.9 in adult [facility]) 12/04/2017   Genetic testing 03/03/2016   Polyposis syndrome gastric and colonic - attenuated 12/09/2015   Pre-operative clearance 11/13/2015   Essential hypertension 11/13/2015   NS (nuclear sclerosis) 03/11/2015   Degenerative arthritis of lumbar spine 09/03/2014   Lumbar and sacral osteoarthritis 09/03/2014   Ocular rosacea 08/19/2014   Incomplete rotator cuff tear 07/22/2014   Conjunctival chalasis 02/12/2014   Dry eye syndrome 02/12/2014   Dry eye 02/12/2014   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   Disease of eyelid 02/12/2014   SPL (spondylolisthesis) 11/27/2013   Family history of gastric cancer 05/16/2013   Family history of cancer of digestive organ 05/16/2013   Imbalance 06/14/2012   Peripheral neuropathic pain 06/14/2012   Neuropathy 06/14/2012   Glaucoma suspect 11/03/2011   Cataract, nuclear 11/03/2011   Posterior vitreous detachment 11/03/2011   Hole,  retinal 11/03/2011   Ache in joint 05/26/2011   Gastro-esophageal reflux disease without esophagitis 05/26/2011   Acid reflux 05/26/2011   Hypercholesterolemia 05/26/2011   Hyperlipidemia 05/27/2009   Morbid obesity ([place]) 05/27/2009   GERD 09/11/2008   History of colonic polyps 09/11/2008   Multiple gastric polyps 09/11/2008   H/O disease 09/11/2008   HOARSENESS 09/10/2008   SLEEP APNEA 11/05/2007   ASTHMA 10/12/2007   Airway hyperreactivity 10/12/2007   GASTRITIS, CHRONIC 08/01/2006   AG (atrophic gastritis) 08/01/2006    ONSET DATE: 01/18/2022 (referral)    REFERRING DIAG: M79.2 (ICD-10-CM) - Peripheral neuropathic pain   THERAPY DIAG:  Other abnormalities of gait and mobility  Difficulty in walking, not elsewhere classified  Muscle weakness (generalized)  Rationale for Evaluation and Treatment Rehabilitation   PERTINENT HISTORY: B neuropathy with B AFOs, Ashma, skin CA, sleep apnea with CPAP, morbid obesity, HLD, OA, R fib fx (2020) after fall on concrete. He was diagnosed with idiopathic neuropathy in 2013, originally seen by a spine doctor who didn't find anything wrong with his spine besides mild disc disease. He is having more pain in his spine recently going down his right leg that will get better with changing positions. Usually happens after sleeping in the morning. Has not tried PT for this problem. He says he first started having neuropathy symptoms in 2008. He was at [employer] for [employer] and had trouble walking down a hill after a long walk to the ceremony. At that time he figured it was related to his knees. He went to therapy for this but the therapist noticed he was having some neuropathy symptoms and he was sent for evaluation. He describes both numbness and weakness. He says he hasn't completely lost feeling in either foot. He did get AFOs about 5 years ago. His lower legs are the biggest problems, his quadriceps seem to be strong.   PRECAUTIONS: Fall   PATIENT GOALS: "Maintaining Balance"                                                                                                                                                                                              SUBJECTIVE STATEMENT: presents for 1st aquatic session.   Pt accompanied by: self  PAIN:  Are you having pain? Yes: NPRS scale: 3/10 Pain location: Bilateral knees and shoulders Pain description: Achy/OA   VITALS: There were no vitals filed for this visit.    OBJECTIVE:    SENSATION: Bilateral peripheral neuropathy in  feet   TODAY'S SESSION  Aquatic therapy at [facility] - pool temp 90 degrees  Patient seen for aquatic  therapy today.  Treatment took place in water 3.5-4.5 feet deep depending upon activity.  Pt entered/exited pool via stairs with bil rails.  Gait into pool to deeper water of ~4.0-4.3 foot depth with SBA, no AD. Forward gait across pool for 18 feet x 8 laps Backward gait across pool for 18 feet x 6 laps Side stepping left<>right for 18 feet across pool x 5 laps each way  At wall (~4.0 foot depth) with UE support at as needed, aquatic cuffs to bil LE's: 15-20 reps each side of the following Heel/toe raises Alternating slow marching Alternating hip ab/add Alternating hip extension Alternating HS curls  With single bar bells next to wall at ~4.0 foot water depth Shoulder horizontal abd/add x 10 reps Holding arms straight LID:CVUDTHYHOOI lowering one arm at at time down under water<>back to water level for 10 reps each side Holding arms straight out at sides: alternating lowering one arm down toward legs<>back to water level for 10 reps each side  With single bar bells; Forward mini lunges while moving arms out<>in for 18 feet across pool x 4 laps Marching with contralateral hand taps to knee each time for 18 feet across pool x 4 laps  At wall in ~4.0 foot water depth Runner stretch for 30 seconds 1-2 reps each side.     Pt requires buoyancy of water for support for reduced fall risk with gait training and balance exercises with minimal UE support; exercises able to be performed safely in water without the risk of fall compared to those same exercises performed on land;  viscosity of water needed for resistance for strengthening.  Current of water provides perturbations for challenging static & dynamic standing balance.      PATIENT EDUCATION: Education details: aquatic ex's to perform at home pool Person educated: Patient Education method: Holiday representative Education comprehension: verbalized understanding   HOME EXERCISE PROGRAM: Access Code: [email or web address] URL: [email or web address] Date: 02/11/2022 Prepared by: Mickie Bail Plaster  Exercises - Standing on foam in corner with eyes closed   - 1 x daily - 7 x weekly - 3 sets - 15-30 seconds hold - Standing on old pillows in corner with eyes open and feet close together  - 1 x daily - 7 x weekly - 3 sets - 15-30 second hold - Standing in corner with eyes closed and head turns  - 1 x daily - 7 x weekly - 3 sets - 10 reps    GOALS: Goals reviewed with patient? Yes  SHORT TERM GOALS: Target date: 02/15/2022  Pt will be independent with initial HEP for improved strength, balance, transfers and gait. Baseline: not established on eval  Goal status: INITIAL  2. Pt will improve Berg score to 26/56 for decreased fall risk Baseline: 22/56 Goal status: INITIAL  3.  Pt will improve gait velocity to at least 2.78f/s w/LRAD for improved gait efficiency  Baseline: 2.27fs  Goal status: INITIAL  4.  Pt will improve 5 x STS to less than or equal to 25 seconds w/BUE support and reduced bracing on chair to demonstrate improved functional strength and transfer efficiency.  Baseline: 29.56s w/BUE support and heavy bracing against chair  Goal status: INITIAL  5.  Pt will improve condition 4 of MCTSIB to >/= 15 seconds for improved dynamic balance  Baseline: 10s  Goal status: REVISED  LONG TERM GOALS: Target date: 03/08/2022  Pt will be independent with final HEP for improved strength, balance, transfers and gait. Baseline:  Goal status: INITIAL  2.  Pt will improve condition 4 of MCTSIB to >/= 20s for improved dynamic stability w/EC Baseline: 10s  Goal status: REVISED  3. Pt will improve Berg score to 31/56 for decreased fall risk Baseline: 22/56 Goal status: INITIAL  4.  Pt will improve gait velocity to at least 2.2f/s w/LRAD for improved gait efficiency  Baseline:  2.24 ft/s  Goal status: INITIAL  5.  Pt will improve 5 x STS to less than or equal to 21 seconds w/BUE support to demonstrate improved functional strength and transfer efficiency.  Baseline: 29.56s w/BUE support  Goal status: INITIAL  ASSESSMENT:  CLINICAL IMPRESSION: Today's skilled session focused [facility] No issues noted or reported with performance in session. The pt is progressing and should benefit from continued PT to progress toward unmet goals.   OBJECTIVE IMPAIRMENTS Abnormal gait, decreased activity tolerance, decreased balance, decreased endurance, decreased mobility, difficulty walking, impaired sensation, improper body mechanics, and pain.   ACTIVITY LIMITATIONS carrying, lifting, bending, squatting, stairs, transfers, reach over head, and locomotion level  PARTICIPATION LIMITATIONS: meal prep, cleaning, laundry, driving, shopping, community activity, occupation, and yard work  PERSONAL FACTORS Age, Fitness, Past/current experiences, and 1-2 comorbidities: severe OA in B shoulders and bilateral peripheral neuropathy  are also affecting patient's functional outcome.   REHAB POTENTIAL: Fair due to time since onset of neuropathy and sedentary lifestyle  CLINICAL DECISION MAKING: Stable/uncomplicated  EVALUATION COMPLEXITY: Low  PLAN: PT FREQUENCY: 2x/week  PT DURATION: 6 weeks  PLANNED INTERVENTIONS: Therapeutic exercises, Therapeutic activity, Neuromuscular re-education, Balance training, Gait training, Patient/Family education, Self Care, Stair training, Orthotic/Fit training, DME instructions, Aquatic Therapy, and Re-evaluation  PLAN FOR NEXT SESSION:  STGs due. How did pt feel after pool session? add to HEP for improved transfers and balance deficits. Sit <>stands, any dynamic balance/endurance, compensation strategies for proprioception deficits, hip strategy techniques     KWillow Ora PTA, CHardin Medical CenterOutpatient Neuro  RA[facility] SBarnwellGMaumelle Kirby [phone]/22/23, 9:20 AM

## 2022-02-16 ENCOUNTER — Ambulatory Visit: Payer: Medicare PPO | Admitting: Physical Therapy

## 2022-02-17 ENCOUNTER — Ambulatory Visit: Payer: Medicare PPO | Admitting: Physical Therapy

## 2022-02-18 ENCOUNTER — Encounter: Payer: Self-pay | Admitting: Physical Therapy

## 2022-02-18 ENCOUNTER — Ambulatory Visit: Payer: Medicare PPO | Admitting: Physical Therapy

## 2022-02-18 DIAGNOSIS — R262 Difficulty in walking, not elsewhere classified: Secondary | ICD-10-CM | POA: Diagnosis not present

## 2022-02-18 DIAGNOSIS — M6281 Muscle weakness (generalized): Secondary | ICD-10-CM | POA: Diagnosis not present

## 2022-02-18 DIAGNOSIS — M79604 Pain in right leg: Secondary | ICD-10-CM | POA: Diagnosis not present

## 2022-02-18 DIAGNOSIS — M792 Neuralgia and neuritis, unspecified: Secondary | ICD-10-CM | POA: Diagnosis not present

## 2022-02-18 DIAGNOSIS — R2689 Other abnormalities of gait and mobility: Secondary | ICD-10-CM

## 2022-02-18 DIAGNOSIS — M545 Low back pain, unspecified: Secondary | ICD-10-CM | POA: Diagnosis not present

## 2022-02-18 NOTE — Therapy (Signed)
OUTPATIENT PHYSICAL THERAPY NEURO TREATMENT   Patient Name: Albert Barr MRN: [id number] DOB:06-30-1940, 81 y.o., male Today's Date: 02/18/2022   PCP: Reynold Bowen, MD  REFERRING PROVIDER: Stefanie Libel, MD     PT End of Session - 02/18/22 1449     Visit Number 6    Number of Visits 13   Plus eval   Date for PT Re-Evaluation 03/15/22    Authorization Type [employer]    Authorization Time Period 01/25/22-03/15/22 13 visits approved    Authorization - Visit Number 6   8/22- updated number   Authorization - Number of Visits 13    Progress Note Due on Visit 10    PT Start Time 1448    PT Stop Time 1530    PT Time Calculation (min) 42 min    Equipment Utilized During Treatment --    Activity Tolerance Patient tolerated treatment well    Behavior During Therapy WFL for tasks assessed/performed                Past Medical History:  Diagnosis Date   Allergy    Anal fissure    Arthritis    shoulder, knees   Asthma    Atrophic gastritis without mention of hemorrhage    Cataract    bilateraly   Esophageal reflux    Family history of gastric cancer    Fibula fracture    hair line fracture   Gastric polyps    history   Hiatal hernia    Hx of adenomatous colonic polyps    Hyperlipidemia    Hypertension    Metaplasia of esophagus 2011   "gastric metaplasia"   Morbid obesity ([place])    Obstructive sleep apnea on CPAP    Other voice and resonance disorders    Peripheral neuropathy    Polyposis syndrome gastric and colonic - attenuated 12/09/2015   Sleep apnea    uses CPAP   Squamous cell skin cancer 04/2018   lesion left forearm   Unspecified asthma(493.90)    Past Surgical History:  Procedure Laterality Date   APPENDECTOMY     CHOLECYSTECTOMY     COLONOSCOPY  08/20/2009 (multiple)   Tubular adenoma   ESOPHAGOGASTRODUODENOSCOPY  08/20/09 (multiple)   [facility]  2007   KNEE SURGERY     arthroscopy- both knees   POLYPECTOMY      UPPER GASTROINTESTINAL ENDOSCOPY     Patient Active Problem List   Diagnosis Date Noted   Primary osteoarthritis of both shoulders 01/18/2022   Leg edema, left 03/03/2020   Bilateral swelling of feet and ankles 12/18/2018   Acute right ankle pain 09/18/2018   Foot drop, bilateral 01/25/2018   Morbid obesity with body mass index of 40.0-44.9 in adult [facility]) 12/04/2017   Genetic testing 03/03/2016   Polyposis syndrome gastric and colonic - attenuated 12/09/2015   Pre-operative clearance 11/13/2015   Essential hypertension 11/13/2015   NS (nuclear sclerosis) 03/11/2015   Degenerative arthritis of lumbar spine 09/03/2014   Lumbar and sacral osteoarthritis 09/03/2014   Ocular rosacea 08/19/2014   Incomplete rotator cuff tear 07/22/2014   Conjunctival chalasis 02/12/2014   Dry eye syndrome 02/12/2014   Dry eye 02/12/2014   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   Disease of eyelid 02/12/2014   SPL (spondylolisthesis) 11/27/2013   Family history of gastric cancer 05/16/2013   Family history of cancer of digestive organ 05/16/2013   Imbalance 06/14/2012   Peripheral neuropathic pain 06/14/2012   Neuropathy  06/14/2012   Glaucoma suspect 11/03/2011   Cataract, nuclear 11/03/2011   Posterior vitreous detachment 11/03/2011   Hole, retinal 11/03/2011   Ache in joint 05/26/2011   Gastro-esophageal reflux disease without esophagitis 05/26/2011   Acid reflux 05/26/2011   Hypercholesterolemia 05/26/2011   Hyperlipidemia 05/27/2009   Morbid obesity ([place]) 05/27/2009   GERD 09/11/2008   History of colonic polyps 09/11/2008   Multiple gastric polyps 09/11/2008   H/O disease 09/11/2008   HOARSENESS 09/10/2008   SLEEP APNEA 11/05/2007   ASTHMA 10/12/2007   Airway hyperreactivity 10/12/2007   GASTRITIS, CHRONIC 08/01/2006   AG (atrophic gastritis) 08/01/2006    ONSET DATE: 01/18/2022 (referral)   REFERRING DIAG: M79.2 (ICD-10-CM) - Peripheral neuropathic pain   THERAPY DIAG:  Other  abnormalities of gait and mobility  Difficulty in walking, not elsewhere classified  Muscle weakness (generalized)  Rationale for Evaluation and Treatment Rehabilitation   PERTINENT HISTORY: B neuropathy with B AFOs, Ashma, skin CA, sleep apnea with CPAP, morbid obesity, HLD, OA, R fib fx (2020) after fall on concrete. He was diagnosed with idiopathic neuropathy in 2013, originally seen by a spine doctor who didn't find anything wrong with his spine besides mild disc disease. He is having more pain in his spine recently going down his right leg that will get better with changing positions. Usually happens after sleeping in the morning. Has not tried PT for this problem. He says he first started having neuropathy symptoms in 2008. He was at [employer] for [employer] and had trouble walking down a hill after a long walk to the ceremony. At that time he figured it was related to his knees. He went to therapy for this but the therapist noticed he was having some neuropathy symptoms and he was sent for evaluation. He describes both numbness and weakness. He says he hasn't completely lost feeling in either foot. He did get AFOs about 5 years ago. His lower legs are the biggest problems, his quadriceps seem to be strong.   PRECAUTIONS: Fall   PATIENT GOALS: "Maintaining Balance"                                                                                                                                                                                              SUBJECTIVE STATEMENT: No new complaints. No falls. Felt tired, yet good, after pool session. Wants to get into the pool more.   Pt accompanied by: self  PAIN:  Are you having pain? Yes: NPRS scale: 2/10 Pain location: Bilateral knees and shoulders Pain description: Achy/OA   VITALS: There were no vitals filed for this  visit.    OBJECTIVE:    SENSATION: Bilateral peripheral neuropathy in feet   TODAY'S  SESSION:  STRENGTHENING  HEP- "not well" as he has had limited time due to cleaning out attic at [employer]. When he gets to them they are challenging.   5 time sit to stand: 12.13 sec's no UE support from standard height surface with retropulsion for 3 reps.  BALANCE/NMR:   [facility] PT Assessment - 02/18/22 1511       Berg Balance Test   Sit to Stand Able to stand using hands after several tries    Standing Unsupported Able to stand 2 minutes with supervision    Sitting with Back Unsupported but Feet Supported on Floor or Stool Able to sit safely and securely 2 minutes    Stand to Sit Controls descent by using hands    Transfers Able to transfer safely, definite need of hands    Standing Unsupported with Eyes Closed Able to stand 10 seconds with supervision    Standing Unsupported with Feet Together Able to place feet together independently and stand for 1 minute with supervision    From Standing, Reach Forward with Outstretched Arm Can reach forward >5 cm safely (2")   ~2-3 inches   From Standing Position, Pick up Object from Floor Unable to pick up shoe, but reaches 2-5 cm (1-2") from shoe and balances independently    From Standing Position, Turn to Look Behind Over each Shoulder Needs supervision when turning   did move hips this time, still with posterior lean with supervision needed   Turn 360 Degrees Needs close supervision or verbal cueing    Standing Unsupported, Alternately Place Feet on Step/Stool Able to complete >2 steps/needs minimal assist   min HHA   Standing Unsupported, One Foot in Front Able to take small step independently and hold 30 seconds    Standing on One Leg Tries to lift leg/unable to hold 3 seconds but remains standing independently    Total Score 31    Berg comment: High fall risk (<36)             PATIENT EDUCATION: Education details: progress toward STS and new schedule with 2 more pool appts Person educated: Patient Education method: Data processing manager Education comprehension: verbalized understanding   HOME EXERCISE PROGRAM: Access Code: [email or web address] URL: [email or web address] Date: 02/11/2022 Prepared by: Mickie Bail Plaster  Exercises - Standing on foam in corner with eyes closed   - 1 x daily - 7 x weekly - 3 sets - 15-30 seconds hold - Standing on old pillows in corner with eyes open and feet close together  - 1 x daily - 7 x weekly - 3 sets - 15-30 second hold - Standing in corner with eyes closed and head turns  - 1 x daily - 7 x weekly - 3 sets - 10 reps    GOALS: Goals reviewed with patient? Yes  SHORT TERM GOALS: Target date: 02/15/2022  Pt will be independent with initial HEP for improved strength, balance, transfers and gait. Baseline: 02/18/22: has program for home however has been inconsistent with performance Goal status: PARTIALLY MET  2. Pt will improve Berg score to 26/56 for decreased fall risk Baseline: 02/18/22: 31/56 scored today  Goal status: MET  3.  Pt will improve gait velocity to at least 2.82f/s w/LRAD for improved gait efficiency  Baseline: 2.267fs  Goal status: INITIAL  4.  Pt will improve 5 x STS to less than or equal  to 25 seconds w/BUE support and reduced bracing on chair to demonstrate improved functional strength and transfer efficiency.  Baseline: 02/18/22: 12.13 sec's no UE support with bracing on chair surface Goal status: MET  5.  Pt will improve condition 4 of MCTSIB to >/= 15 seconds for improved dynamic balance  Baseline: 10s  Goal status: REVISED  LONG TERM GOALS: Target date: 03/08/2022  Pt will be independent with final HEP for improved strength, balance, transfers and gait. Baseline:  Goal status: INITIAL  2.  Pt will improve condition 4 of MCTSIB to >/= 20s for improved dynamic stability w/EC Baseline: 10s  Goal status: REVISED  3. Pt will improve Berg score to 31/56 for decreased fall risk Baseline: 22/56 Goal status: INITIAL  4.  Pt will  improve gait velocity to at least 2.59f/s w/LRAD for improved gait efficiency  Baseline: 2.24 ft/s  Goal status: INITIAL  5.  Pt will improve 5 x STS to less than or equal to 21 seconds w/BUE support to demonstrate improved functional strength and transfer efficiency.  Baseline: 29.56s w/BUE support  Goal status: INITIAL  ASSESSMENT:  CLINICAL IMPRESSION: Today's skilled session focused on progress toward STGs after adjusting pt's schedule for more aquatic sessions.    OBJECTIVE IMPAIRMENTS Abnormal gait, decreased activity tolerance, decreased balance, decreased endurance, decreased mobility, difficulty walking, impaired sensation, improper body mechanics, and pain.   ACTIVITY LIMITATIONS carrying, lifting, bending, squatting, stairs, transfers, reach over head, and locomotion level  PARTICIPATION LIMITATIONS: meal prep, cleaning, laundry, driving, shopping, community activity, occupation, and yard work  PERSONAL FACTORS Age, Fitness, Past/current experiences, and 1-2 comorbidities: severe OA in B shoulders and bilateral peripheral neuropathy  are also affecting patient's functional outcome.   REHAB POTENTIAL: Fair due to time since onset of neuropathy and sedentary lifestyle  CLINICAL DECISION MAKING: Stable/uncomplicated  EVALUATION COMPLEXITY: Low  PLAN: PT FREQUENCY: 2x/week  PT DURATION: 6 weeks  PLANNED INTERVENTIONS: Therapeutic exercises, Therapeutic activity, Neuromuscular re-education, Balance training, Gait training, Patient/Family education, Self Care, Stair training, Orthotic/Fit training, DME instructions, Aquatic Therapy, and Re-evaluation  PLAN FOR NEXT SESSION:  STGs due. How did pt feel after pool session? add to HEP for improved transfers and balance deficits. Sit <>stands, any dynamic balance/endurance, compensation strategies for proprioception deficits, hip strategy techniques     KWillow Ora PTA, CHampstead HospitalOutpatient Neuro RPalestine Regional Medical Center[address]  SOxford JunctionGSan Geronimo [place] [phone]/25/23, 4:16 PM

## 2022-02-21 ENCOUNTER — Ambulatory Visit: Payer: Medicare PPO | Admitting: Physical Therapy

## 2022-02-22 ENCOUNTER — Ambulatory Visit: Payer: Medicare PPO | Admitting: Physical Therapy

## 2022-02-22 ENCOUNTER — Ambulatory Visit: Payer: Self-pay | Admitting: Physical Therapy

## 2022-02-22 DIAGNOSIS — M545 Low back pain, unspecified: Secondary | ICD-10-CM | POA: Diagnosis not present

## 2022-02-22 DIAGNOSIS — R2689 Other abnormalities of gait and mobility: Secondary | ICD-10-CM | POA: Diagnosis not present

## 2022-02-22 DIAGNOSIS — M6281 Muscle weakness (generalized): Secondary | ICD-10-CM | POA: Diagnosis not present

## 2022-02-22 DIAGNOSIS — R262 Difficulty in walking, not elsewhere classified: Secondary | ICD-10-CM

## 2022-02-22 DIAGNOSIS — M79604 Pain in right leg: Secondary | ICD-10-CM | POA: Diagnosis not present

## 2022-02-22 DIAGNOSIS — M792 Neuralgia and neuritis, unspecified: Secondary | ICD-10-CM | POA: Diagnosis not present

## 2022-02-23 ENCOUNTER — Encounter: Payer: Self-pay | Admitting: Physical Therapy

## 2022-02-23 ENCOUNTER — Ambulatory Visit: Payer: Medicare PPO | Admitting: Physical Therapy

## 2022-02-23 NOTE — Therapy (Signed)
OUTPATIENT PHYSICAL THERAPY NEURO TREATMENT   Patient Name: Albert Barr MRN: [id number] DOB:09/06/1940, 81 y.o., male Today's Date: 02/23/2022   PCP: Reynold Bowen, MD  REFERRING PROVIDER: Stefanie Libel, MD     PT End of Session - 02/22/22 2026     Visit Number 7    Number of Visits 13   Plus eval   Date for PT Re-Evaluation 03/15/22    Authorization Type [employer]    Authorization Time Period 01/25/22-03/15/22 13 visits approved    Authorization - Visit Number 7   8/22- updated number   Authorization - Number of Visits 13    Progress Note Due on Visit 10    PT Start Time 1018    PT Stop Time 1100    PT Time Calculation (min) 42 min    Equipment Utilized During Treatment Other (comment)   aquatic cuffs, single bar bells, aquatic step   Activity Tolerance Patient tolerated treatment well    Behavior During Therapy WFL for tasks assessed/performed                Past Medical History:  Diagnosis Date   Allergy    Anal fissure    Arthritis    shoulder, knees   Asthma    Atrophic gastritis without mention of hemorrhage    Cataract    bilateraly   Esophageal reflux    Family history of gastric cancer    Fibula fracture    hair line fracture   Gastric polyps    history   Hiatal hernia    Hx of adenomatous colonic polyps    Hyperlipidemia    Hypertension    Metaplasia of esophagus 2011   "gastric metaplasia"   Morbid obesity ([place])    Obstructive sleep apnea on CPAP    Other voice and resonance disorders    Peripheral neuropathy    Polyposis syndrome gastric and colonic - attenuated 12/09/2015   Sleep apnea    uses CPAP   Squamous cell skin cancer 04/2018   lesion left forearm   Unspecified asthma(493.90)    Past Surgical History:  Procedure Laterality Date   APPENDECTOMY     CHOLECYSTECTOMY     COLONOSCOPY  08/20/2009 (multiple)   Tubular adenoma   ESOPHAGOGASTRODUODENOSCOPY  08/20/09 (multiple)   [place]  2007    KNEE SURGERY     arthroscopy- both knees   POLYPECTOMY     UPPER GASTROINTESTINAL ENDOSCOPY     Patient Active Problem List   Diagnosis Date Noted   Primary osteoarthritis of both shoulders 01/18/2022   Leg edema, left 03/03/2020   Bilateral swelling of feet and ankles 12/18/2018   Acute right ankle pain 09/18/2018   Foot drop, bilateral 01/25/2018   Morbid obesity with body mass index of 40.0-44.9 in adult [facility]) 12/04/2017   Genetic testing 03/03/2016   Polyposis syndrome gastric and colonic - attenuated 12/09/2015   Pre-operative clearance 11/13/2015   Essential hypertension 11/13/2015   NS (nuclear sclerosis) 03/11/2015   Degenerative arthritis of lumbar spine 09/03/2014   Lumbar and sacral osteoarthritis 09/03/2014   Ocular rosacea 08/19/2014   Incomplete rotator cuff tear 07/22/2014   Conjunctival chalasis 02/12/2014   Dry eye syndrome 02/12/2014   Dry eye 02/12/2014   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   Disease of eyelid 02/12/2014   SPL (spondylolisthesis) 11/27/2013   Family history of gastric cancer 05/16/2013   Family history of cancer of digestive organ 05/16/2013   Imbalance 06/14/2012  Peripheral neuropathic pain 06/14/2012   Neuropathy 06/14/2012   Glaucoma suspect 11/03/2011   Cataract, nuclear 11/03/2011   Posterior vitreous detachment 11/03/2011   Hole, retinal 11/03/2011   Ache in joint 05/26/2011   Gastro-esophageal reflux disease without esophagitis 05/26/2011   Acid reflux 05/26/2011   Hypercholesterolemia 05/26/2011   Hyperlipidemia 05/27/2009   Morbid obesity ([place]) 05/27/2009   GERD 09/11/2008   History of colonic polyps 09/11/2008   Multiple gastric polyps 09/11/2008   H/O disease 09/11/2008   HOARSENESS 09/10/2008   SLEEP APNEA 11/05/2007   ASTHMA 10/12/2007   Airway hyperreactivity 10/12/2007   GASTRITIS, CHRONIC 08/01/2006   AG (atrophic gastritis) 08/01/2006    ONSET DATE: 01/18/2022 (referral)   REFERRING DIAG: M79.2  (ICD-10-CM) - Peripheral neuropathic pain   THERAPY DIAG:  Other abnormalities of gait and mobility  Difficulty in walking, not elsewhere classified  Muscle weakness (generalized)  Rationale for Evaluation and Treatment Rehabilitation   PERTINENT HISTORY: B neuropathy with B AFOs, Ashma, skin CA, sleep apnea with CPAP, morbid obesity, HLD, OA, R fib fx (2020) after fall on concrete. He was diagnosed with idiopathic neuropathy in 2013, originally seen by a spine doctor who didn't find anything wrong with his spine besides mild disc disease. He is having more pain in his spine recently going down his right leg that will get better with changing positions. Usually happens after sleeping in the morning. Has not tried PT for this problem. He says he first started having neuropathy symptoms in 2008. He was at [employer] for [employer] and had trouble walking down a hill after a long walk to the ceremony. At that time he figured it was related to his knees. He went to therapy for this but the therapist noticed he was having some neuropathy symptoms and he was sent for evaluation. He describes both numbness and weakness. He says he hasn't completely lost feeling in either foot. He did get AFOs about 5 years ago. His lower legs are the biggest problems, his quadriceps seem to be strong.   PRECAUTIONS: Fall   PATIENT GOALS: "Maintaining Balance"                                                                                                                                                                                              SUBJECTIVE STATEMENT: No new complaints. No falls. Felt tired, yet good, after pool session. Wants to get into the pool more.   Pt accompanied by: self  PAIN:  Are you having pain? Yes: NPRS scale: 2/10 Pain location: Bilateral knees and shoulders Pain description: Achy/OA   VITALS:  There were no vitals filed for this visit.    OBJECTIVE:    SENSATION:  Bilateral peripheral neuropathy in feet   TODAY'S SESSION: Aquatic therapy at [facility] - pool temp 90 degrees   Patient seen for aquatic therapy today.  Treatment took place in water 3.5-4.5 feet deep depending upon activity.  Pt entered/exited pool via stairs with bil rails.   Gait into pool to deeper water of ~4.0-4.3 foot depth with SBA, no AD. Forward gait across pool for 18 feet x 8 laps Backward gait across pool for 18 feet x 6 laps Side stepping left<>right for 18 feet across pool x 5 laps each way   At wall (~4.0 foot depth) with UE support at as needed, aquatic cuffs to bil LE's: 15-20 reps each side of the following Heel/toe raises Alternating slow marching Alternating hip ab/add Alternating hip extension Alternating HS curls     With single bar bells in ~4.0-4.3 foot water depth with SBA as needed for balance Forward mini lunges while moving arms out<>in for 18 feet across pool x 4 laps Marching with contralateral hand taps to knee each time for 18 feet across pool x 4 laps   At wall in ~4.0 to 4.3 foot water depth with aquatic step Forward step ups 10 reps each side Lateral step ups with contralateral side kicks x 10 reps each       Pt requires buoyancy of water for support for reduced fall risk with gait training and balance exercises with minimal UE support; exercises able to be performed safely in water without the risk of fall compared to those same exercises performed on land;  viscosity of water needed for resistance for strengthening.  Current of water provides perturbations for challenging static & dynamic standing balance.         PATIENT EDUCATION: Education details: continue with current HEP Person educated: Patient Education method: Customer service manager Education comprehension: verbalized understanding   HOME EXERCISE PROGRAM: Access Code: [email or web address] URL: [email or web address] Date: 02/11/2022 Prepared by: Mickie Bail  Plaster  Exercises - Standing on foam in corner with eyes closed   - 1 x daily - 7 x weekly - 3 sets - 15-30 seconds hold - Standing on old pillows in corner with eyes open and feet close together  - 1 x daily - 7 x weekly - 3 sets - 15-30 second hold - Standing in corner with eyes closed and head turns  - 1 x daily - 7 x weekly - 3 sets - 10 reps    GOALS: Goals reviewed with patient? Yes  SHORT TERM GOALS: Target date: 02/15/2022  Pt will be independent with initial HEP for improved strength, balance, transfers and gait. Baseline: 02/18/22: has program for home however has been inconsistent with performance Goal status: PARTIALLY MET  2. Pt will improve Berg score to 26/56 for decreased fall risk Baseline: 02/18/22: 31/56 scored today  Goal status: MET  3.  Pt will improve gait velocity to at least 2.61f/s w/LRAD for improved gait efficiency  Baseline: 2.255fs  Goal status: INITIAL  4.  Pt will improve 5 x STS to less than or equal to 25 seconds w/BUE support and reduced bracing on chair to demonstrate improved functional strength and transfer efficiency.  Baseline: 02/18/22: 12.13 sec's no UE support with bracing on chair surface Goal status: MET  5.  Pt will improve condition 4 of MCTSIB to >/= 15 seconds for improved dynamic balance  Baseline: 10s  Goal status: REVISED  LONG TERM GOALS: Target date: 03/08/2022  Pt will be independent with final HEP for improved strength, balance, transfers and gait. Baseline:  Goal status: INITIAL  2.  Pt will improve condition 4 of MCTSIB to >/= 20s for improved dynamic stability w/EC Baseline: 10s  Goal status: REVISED  3. Pt will improve Berg score to 31/56 for decreased fall risk Baseline: 22/56 Goal status: INITIAL  4.  Pt will improve gait velocity to at least 2.71f/s w/LRAD for improved gait efficiency  Baseline: 2.24 ft/s  Goal status: INITIAL  5.  Pt will improve 5 x STS to less than or equal to 21 seconds w/BUE support  to demonstrate improved functional strength and transfer efficiency.  Baseline: 29.56s w/BUE support  Goal status: INITIAL  ASSESSMENT:  CLINICAL IMPRESSION: Today's skilled session continued to focus on gait, strengthening, balance and coordination in the aquatic setting. No issues noted or reported in session. The pt is making steady progress toward goals and should benefit from continued PT to progress toward unmet goals.     OBJECTIVE IMPAIRMENTS Abnormal gait, decreased activity tolerance, decreased balance, decreased endurance, decreased mobility, difficulty walking, impaired sensation, improper body mechanics, and pain.   ACTIVITY LIMITATIONS carrying, lifting, bending, squatting, stairs, transfers, reach over head, and locomotion level  PARTICIPATION LIMITATIONS: meal prep, cleaning, laundry, driving, shopping, community activity, occupation, and yard work  PERSONAL FACTORS Age, Fitness, Past/current experiences, and 1-2 comorbidities: severe OA in B shoulders and bilateral peripheral neuropathy  are also affecting patient's functional outcome.   REHAB POTENTIAL: Fair due to time since onset of neuropathy and sedentary lifestyle  CLINICAL DECISION MAKING: Stable/uncomplicated  EVALUATION COMPLEXITY: Low  PLAN: PT FREQUENCY: 2x/week  PT DURATION: 6 weeks  PLANNED INTERVENTIONS: Therapeutic exercises, Therapeutic activity, Neuromuscular re-education, Balance training, Gait training, Patient/Family education, Self Care, Stair training, Orthotic/Fit training, DME instructions, Aquatic Therapy, and Re-evaluation  PLAN FOR NEXT SESSION:  STGs due. How did pt feel after pool session? add to HEP for improved transfers and balance deficits. Sit <>stands, any dynamic balance/endurance, compensation strategies for proprioception deficits, hip strategy techniques     KWillow Ora PTA, CKaiser Permanente Honolulu Clinic AscOutpatient Neuro RRiverside Surgery Center Inc[address] SUplands ParkGRichardson Sanborn  [phone]/30/23, 8:28 PM

## 2022-02-24 ENCOUNTER — Ambulatory Visit: Payer: Medicare PPO | Admitting: Physical Therapy

## 2022-02-25 ENCOUNTER — Ambulatory Visit: Payer: Medicare PPO | Attending: Sports Medicine | Admitting: Physical Therapy

## 2022-02-25 DIAGNOSIS — M6281 Muscle weakness (generalized): Secondary | ICD-10-CM | POA: Insufficient documentation

## 2022-02-25 DIAGNOSIS — R262 Difficulty in walking, not elsewhere classified: Secondary | ICD-10-CM | POA: Insufficient documentation

## 2022-02-25 DIAGNOSIS — M545 Low back pain, unspecified: Secondary | ICD-10-CM | POA: Diagnosis not present

## 2022-02-25 DIAGNOSIS — R2689 Other abnormalities of gait and mobility: Secondary | ICD-10-CM | POA: Insufficient documentation

## 2022-02-25 DIAGNOSIS — M79604 Pain in right leg: Secondary | ICD-10-CM | POA: Insufficient documentation

## 2022-02-25 DIAGNOSIS — R2681 Unsteadiness on feet: Secondary | ICD-10-CM | POA: Insufficient documentation

## 2022-02-25 NOTE — Therapy (Signed)
OUTPATIENT PHYSICAL THERAPY NEURO TREATMENT   Patient Name: Albert Barr MRN: [id number] DOB:09-17-1940, 81 y.o., male Today's Date: 02/25/2022   PCP: Reynold Bowen, MD  REFERRING PROVIDER: Stefanie Libel, MD     PT End of Session - 02/25/22 0800     Visit Number 8    Number of Visits 13   Plus eval   Date for PT Re-Evaluation 03/15/22    Authorization Type [employer]    Authorization Time Period 01/25/22-03/15/22 13 visits approved    Authorization - Visit Number 8   9/1- updated number   Authorization - Number of Visits 13    Progress Note Due on Visit 10    PT Start Time 1412   pt late   PT Stop Time 1454    PT Time Calculation (min) 42 min    Equipment Utilized During Treatment Gait belt    Activity Tolerance Patient tolerated treatment well    Behavior During Therapy WFL for tasks assessed/performed                 Past Medical History:  Diagnosis Date   Allergy    Anal fissure    Arthritis    shoulder, knees   Asthma    Atrophic gastritis without mention of hemorrhage    Cataract    bilateraly   Esophageal reflux    Family history of gastric cancer    Fibula fracture    hair line fracture   Gastric polyps    history   Hiatal hernia    Hx of adenomatous colonic polyps    Hyperlipidemia    Hypertension    Metaplasia of esophagus 2011   "gastric metaplasia"   Morbid obesity ([place])    Obstructive sleep apnea on CPAP    Other voice and resonance disorders    Peripheral neuropathy    Polyposis syndrome gastric and colonic - attenuated 12/09/2015   Sleep apnea    uses CPAP   Squamous cell skin cancer 04/2018   lesion left forearm   Unspecified asthma(493.90)    Past Surgical History:  Procedure Laterality Date   APPENDECTOMY     CHOLECYSTECTOMY     COLONOSCOPY  08/20/2009 (multiple)   Tubular adenoma   ESOPHAGOGASTRODUODENOSCOPY  08/20/09 (multiple)   [place]  2007   KNEE SURGERY     arthroscopy- both  knees   POLYPECTOMY     UPPER GASTROINTESTINAL ENDOSCOPY     Patient Active Problem List   Diagnosis Date Noted   Primary osteoarthritis of both shoulders 01/18/2022   Leg edema, left 03/03/2020   Bilateral swelling of feet and ankles 12/18/2018   Acute right ankle pain 09/18/2018   Foot drop, bilateral 01/25/2018   Morbid obesity with body mass index of 40.0-44.9 in adult [facility]) 12/04/2017   Genetic testing 03/03/2016   Polyposis syndrome gastric and colonic - attenuated 12/09/2015   Pre-operative clearance 11/13/2015   Essential hypertension 11/13/2015   NS (nuclear sclerosis) 03/11/2015   Degenerative arthritis of lumbar spine 09/03/2014   Lumbar and sacral osteoarthritis 09/03/2014   Ocular rosacea 08/19/2014   Incomplete rotator cuff tear 07/22/2014   Conjunctival chalasis 02/12/2014   Dry eye syndrome 02/12/2014   Dry eye 02/12/2014   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   Disease of eyelid 02/12/2014   SPL (spondylolisthesis) 11/27/2013   Family history of gastric cancer 05/16/2013   Family history of cancer of digestive organ 05/16/2013   Imbalance 06/14/2012   Peripheral neuropathic  pain 06/14/2012   Neuropathy 06/14/2012   Glaucoma suspect 11/03/2011   Cataract, nuclear 11/03/2011   Posterior vitreous detachment 11/03/2011   Hole, retinal 11/03/2011   Ache in joint 05/26/2011   Gastro-esophageal reflux disease without esophagitis 05/26/2011   Acid reflux 05/26/2011   Hypercholesterolemia 05/26/2011   Hyperlipidemia 05/27/2009   Morbid obesity ([place]) 05/27/2009   GERD 09/11/2008   History of colonic polyps 09/11/2008   Multiple gastric polyps 09/11/2008   H/O disease 09/11/2008   HOARSENESS 09/10/2008   SLEEP APNEA 11/05/2007   ASTHMA 10/12/2007   Airway hyperreactivity 10/12/2007   GASTRITIS, CHRONIC 08/01/2006   AG (atrophic gastritis) 08/01/2006    ONSET DATE: 01/18/2022 (referral)   REFERRING DIAG: M79.2 (ICD-10-CM) - Peripheral neuropathic pain    THERAPY DIAG:  Other abnormalities of gait and mobility  Difficulty in walking, not elsewhere classified  Muscle weakness (generalized)  Low back pain radiating to right lower extremity  Rationale for Evaluation and Treatment Rehabilitation   PERTINENT HISTORY: B neuropathy with B AFOs, Ashma, skin CA, sleep apnea with CPAP, morbid obesity, HLD, OA, R fib fx (2020) after fall on concrete. He was diagnosed with idiopathic neuropathy in 2013, originally seen by a spine doctor who didn't find anything wrong with his spine besides mild disc disease. He is having more pain in his spine recently going down his right leg that will get better with changing positions. Usually happens after sleeping in the morning. Has not tried PT for this problem. He says he first started having neuropathy symptoms in 2008. He was at [employer] for [employer] and had trouble walking down a hill after a long walk to the ceremony. At that time he figured it was related to his knees. He went to therapy for this but the therapist noticed he was having some neuropathy symptoms and he was sent for evaluation. He describes both numbness and weakness. He says he hasn't completely lost feeling in either foot. He did get AFOs about 5 years ago. His lower legs are the biggest problems, his quadriceps seem to be strong.   PRECAUTIONS: Fall   PATIENT GOALS: "Maintaining Balance"                                                                                                                                                                                              SUBJECTIVE STATEMENT: Pt reports pain currently in his shoulders from OA, 2/10. Pt reports his HEP has been going well. No other changes since last session.  Pt accompanied by: self  PAIN:  Are you having pain? Yes: NPRS scale: 2/10 Pain  location: Bilateral knees and shoulders Pain description: Achy/OA   OBJECTIVE:    SENSATION: Bilateral  peripheral neuropathy in feet   TODAY'S SESSION:  THER ACT: Sit to stand x 10 reps, focus on eccentric control and decreased bracing with LE against mat table, placed airex under bottom to increase surface height. Pt with frequent posterior LOB, needs UE support on sturdy chair to recover. Added to HEP (see bolded below), recommend Supervision for safety.   Alt L/R 6" step-taps with BUE support, progression to 12" step-taps 2 x 10 reps with BUE support  Alt L/R 6" step-taps with one UE support, x 10 reps   Alt 1" step-taps with LUE support, forwards and laterally x 10 reps each  Alt 4" step-taps with LUE support, forwards and laterally x 10 reps each   Alt cane step overs with one UE support and CGA, x 10 reps each  Alt 4" hurdles steps overs with one UE support and min A, x 7 reps each (significant difficulty with this task)    THER EX:  Mini-squats 2 x 10 reps, 2 pillows behind on mat table as target, requires UE support on chair for balance    PATIENT EDUCATION: Education details: continue with current HEP, added sit to stands to HEP Person educated: Patient Education method: Explanation, Demonstration, and Handouts Education comprehension: verbalized understanding   HOME EXERCISE PROGRAM: Access Code: [email or web address] URL: [email or web address] Date: 02/11/2022 Prepared by: Mickie Bail Plaster  Exercises - Standing on foam in corner with eyes closed   - 1 x daily - 7 x weekly - 3 sets - 15-30 seconds hold - Standing on old pillows in corner with eyes open and feet close together  - 1 x daily - 7 x weekly - 3 sets - 15-30 second hold - Standing in corner with eyes closed and head turns  - 1 x daily - 7 x weekly - 3 sets - 10 reps - Sit to Stand with Counter Support  - 1 x daily - 7 x weekly - 3 sets - 10 reps    GOALS: Goals reviewed with patient? Yes  SHORT TERM GOALS: Target date: 02/15/2022  Pt will be independent with initial HEP for improved strength, balance,  transfers and gait. Baseline: 02/18/22: has program for home however has been inconsistent with performance Goal status: PARTIALLY MET  2. Pt will improve Berg score to 26/56 for decreased fall risk Baseline: 02/18/22: 31/56 scored today  Goal status: MET  3.  Pt will improve gait velocity to at least 2.52f/s w/LRAD for improved gait efficiency  Baseline: 2.267fs  Goal status: INITIAL  4.  Pt will improve 5 x STS to less than or equal to 25 seconds w/BUE support and reduced bracing on chair to demonstrate improved functional strength and transfer efficiency.  Baseline: 02/18/22: 12.13 sec's no UE support with bracing on chair surface Goal status: MET  5.  Pt will improve condition 4 of MCTSIB to >/= 15 seconds for improved dynamic balance  Baseline: 10s  Goal status: REVISED  LONG TERM GOALS: Target date: 03/08/2022  Pt will be independent with final HEP for improved strength, balance, transfers and gait. Baseline:  Goal status: INITIAL  2.  Pt will improve condition 4 of MCTSIB to >/= 20s for improved dynamic stability w/EC Baseline: 10s  Goal status: REVISED  3. Pt will improve Berg score to 31/56 for decreased fall risk Baseline: 22/56 Goal status: INITIAL  4.  Pt will improve gait velocity to at least  2.70f/s w/LRAD for improved gait efficiency  Baseline: 2.24 ft/s  Goal status: INITIAL  5.  Pt will improve 5 x STS to less than or equal to 21 seconds w/BUE support to demonstrate improved functional strength and transfer efficiency.  Baseline: 29.56s w/BUE support  Goal status: INITIAL  ASSESSMENT:  CLINICAL IMPRESSION: Emphasis of skilled PT session on LE strengthening and coordination training. Pt exhibits significant difficulty performing sit to stands from lower surfaces without UE support with bracing of LE against surface when standing. Pt benefits from having an elevated surface and some UE support located anteriorly. Added sit to stands to HEP due to pt's  difficulty with this task and ongoing LE weakness. Pt continues to benefit from skilled therapy services to address ongoing strength, balance, and endurance impairments. Continue POC.      OBJECTIVE IMPAIRMENTS Abnormal gait, decreased activity tolerance, decreased balance, decreased endurance, decreased mobility, difficulty walking, impaired sensation, improper body mechanics, and pain.   ACTIVITY LIMITATIONS carrying, lifting, bending, squatting, stairs, transfers, reach over head, and locomotion level  PARTICIPATION LIMITATIONS: meal prep, cleaning, laundry, driving, shopping, community activity, occupation, and yard work  PERSONAL FACTORS Age, Fitness, Past/current experiences, and 1-2 comorbidities: severe OA in B shoulders and bilateral peripheral neuropathy  are also affecting patient's functional outcome.   REHAB POTENTIAL: Fair due to time since onset of neuropathy and sedentary lifestyle  CLINICAL DECISION MAKING: Stable/uncomplicated  EVALUATION COMPLEXITY: Low  PLAN: PT FREQUENCY: 2x/week  PT DURATION: 6 weeks  PLANNED INTERVENTIONS: Therapeutic exercises, Therapeutic activity, Neuromuscular re-education, Balance training, Gait training, Patient/Family education, Self Care, Stair training, Orthotic/Fit training, DME instructions, Aquatic Therapy, and Re-evaluation  PLAN FOR NEXT SESSION:  STGs due. How did pt feel after pool session? add to HEP for improved transfers and balance deficits. Sit <>stands, any dynamic balance/endurance, compensation strategies for proprioception deficits, hip strategy techniques     TExcell Seltzer PT, DPT, [address]Mon[address] [place]acia Villas[place]uck[place] [phone]/01/23, 3:01 PM

## 2022-03-01 ENCOUNTER — Ambulatory Visit: Payer: Self-pay | Admitting: Physical Therapy

## 2022-03-02 ENCOUNTER — Ambulatory Visit: Payer: Medicare PPO | Admitting: Physical Therapy

## 2022-03-02 DIAGNOSIS — M6281 Muscle weakness (generalized): Secondary | ICD-10-CM | POA: Diagnosis not present

## 2022-03-02 DIAGNOSIS — M79604 Pain in right leg: Secondary | ICD-10-CM | POA: Diagnosis not present

## 2022-03-02 DIAGNOSIS — R2681 Unsteadiness on feet: Secondary | ICD-10-CM | POA: Diagnosis not present

## 2022-03-02 DIAGNOSIS — R2689 Other abnormalities of gait and mobility: Secondary | ICD-10-CM | POA: Diagnosis not present

## 2022-03-02 DIAGNOSIS — R262 Difficulty in walking, not elsewhere classified: Secondary | ICD-10-CM

## 2022-03-02 DIAGNOSIS — M545 Low back pain, unspecified: Secondary | ICD-10-CM | POA: Diagnosis not present

## 2022-03-02 NOTE — Therapy (Signed)
OUTPATIENT PHYSICAL THERAPY NEURO TREATMENT   Patient Name: Albert Barr MRN: [id number] DOB:January 24, 1941, 81 y.o., male Today's Date: 03/02/2022   PCP: Reynold Bowen, MD  REFERRING PROVIDER: Stefanie Libel, MD     PT End of Session - 03/02/22 1453     Visit Number 9    Number of Visits 13   Plus eval   Date for PT Re-Evaluation 03/15/22    Authorization Type [employer]    Authorization Time Period 01/25/22-03/15/22 13 visits approved    Authorization - Visit Number 8   9/1- updated number   Authorization - Number of Visits 13    Progress Note Due on Visit 10    PT Start Time 1450   pt arrived late   PT Stop Time 1530    PT Time Calculation (min) 40 min    Equipment Utilized During Treatment Gait belt    Activity Tolerance Patient tolerated treatment well    Behavior During Therapy WFL for tasks assessed/performed                  Past Medical History:  Diagnosis Date   Allergy    Anal fissure    Arthritis    shoulder, knees   Asthma    Atrophic gastritis without mention of hemorrhage    Cataract    bilateraly   Esophageal reflux    Family history of gastric cancer    Fibula fracture    hair line fracture   Gastric polyps    history   Hiatal hernia    Hx of adenomatous colonic polyps    Hyperlipidemia    Hypertension    Metaplasia of esophagus 2011   "gastric metaplasia"   Morbid obesity ([place])    Obstructive sleep apnea on CPAP    Other voice and resonance disorders    Peripheral neuropathy    Polyposis syndrome gastric and colonic - attenuated 12/09/2015   Sleep apnea    uses CPAP   Squamous cell skin cancer 04/2018   lesion left forearm   Unspecified asthma(493.90)    Past Surgical History:  Procedure Laterality Date   APPENDECTOMY     CHOLECYSTECTOMY     COLONOSCOPY  08/20/2009 (multiple)   Tubular adenoma   ESOPHAGOGASTRODUODENOSCOPY  08/20/09 (multiple)   [place]  2007   KNEE SURGERY     arthroscopy-  both knees   POLYPECTOMY     UPPER GASTROINTESTINAL ENDOSCOPY     Patient Active Problem List   Diagnosis Date Noted   Primary osteoarthritis of both shoulders 01/18/2022   Leg edema, left 03/03/2020   Bilateral swelling of feet and ankles 12/18/2018   Acute right ankle pain 09/18/2018   Foot drop, bilateral 01/25/2018   Morbid obesity with body mass index of 40.0-44.9 in adult [facility]) 12/04/2017   Genetic testing 03/03/2016   Polyposis syndrome gastric and colonic - attenuated 12/09/2015   Pre-operative clearance 11/13/2015   Essential hypertension 11/13/2015   NS (nuclear sclerosis) 03/11/2015   Degenerative arthritis of lumbar spine 09/03/2014   Lumbar and sacral osteoarthritis 09/03/2014   Ocular rosacea 08/19/2014   Incomplete rotator cuff tear 07/22/2014   Conjunctival chalasis 02/12/2014   Dry eye syndrome 02/12/2014   Dry eye 02/12/2014   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   Disease of eyelid 02/12/2014   SPL (spondylolisthesis) 11/27/2013   Family history of gastric cancer 05/16/2013   Family history of cancer of digestive organ 05/16/2013   Imbalance 06/14/2012  Peripheral neuropathic pain 06/14/2012   Neuropathy 06/14/2012   Glaucoma suspect 11/03/2011   Cataract, nuclear 11/03/2011   Posterior vitreous detachment 11/03/2011   Hole, retinal 11/03/2011   Ache in joint 05/26/2011   Gastro-esophageal reflux disease without esophagitis 05/26/2011   Acid reflux 05/26/2011   Hypercholesterolemia 05/26/2011   Hyperlipidemia 05/27/2009   Morbid obesity ([place]) 05/27/2009   GERD 09/11/2008   History of colonic polyps 09/11/2008   Multiple gastric polyps 09/11/2008   H/O disease 09/11/2008   HOARSENESS 09/10/2008   SLEEP APNEA 11/05/2007   ASTHMA 10/12/2007   Airway hyperreactivity 10/12/2007   GASTRITIS, CHRONIC 08/01/2006   AG (atrophic gastritis) 08/01/2006    ONSET DATE: 01/18/2022 (referral)   REFERRING DIAG: M79.2 (ICD-10-CM) - Peripheral neuropathic pain    THERAPY DIAG:  Other abnormalities of gait and mobility  Difficulty in walking, not elsewhere classified  Muscle weakness (generalized)  Rationale for Evaluation and Treatment Rehabilitation   PERTINENT HISTORY: B neuropathy with B AFOs, Ashma, skin CA, sleep apnea with CPAP, morbid obesity, HLD, OA, R fib fx (2020) after fall on concrete. He was diagnosed with idiopathic neuropathy in 2013, originally seen by a spine doctor who didn't find anything wrong with his spine besides mild disc disease. He is having more pain in his spine recently going down his right leg that will get better with changing positions. Usually happens after sleeping in the morning. Has not tried PT for this problem. He says he first started having neuropathy symptoms in 2008. He was at [employer] for [employer] and had trouble walking down a hill after a long walk to the ceremony. At that time he figured it was related to his knees. He went to therapy for this but the therapist noticed he was having some neuropathy symptoms and he was sent for evaluation. He describes both numbness and weakness. He says he hasn't completely lost feeling in either foot. He did get AFOs about 5 years ago. His lower legs are the biggest problems, his quadriceps seem to be strong.   PRECAUTIONS: Fall   PATIENT GOALS: "Maintaining Balance"                                                                                                                                                                                              SUBJECTIVE STATEMENT: Pt reports he is doing well, sit <>stands continue to be very challenging but his standing balance is better. No falls.   Pt accompanied by: self  PAIN:  Are you having pain? Yes: NPRS scale: 3/10 Pain location: Bilateral knees and shoulders Pain description: Achy/OA  OBJECTIVE:    SENSATION: Bilateral peripheral neuropathy in feet   TODAY'S SESSION:  Ther Ex  -Mass  practice of sit <>stands from elevated mat and no UE support (x5 reps) and progressively lowered mat height until standard seat height (16") without UE support. Pt demonstrated difficulty with immediate standing balance and relies heavily on bracing against back of mat to stabilize, but is able to come to stand without bracing against mat. Pt performed x8 reps from lowest mat height and no UE support w/distant S*. Final progression to adding blue airex under feet and pt performed 2x6 reps without UE support and no LOB noted. Seated rest breaks provided between sets due to pt fatigue.   NMR  In // bars for improved reaching out of BOS and dynamic standing balance:  -Cone stack and cross-body reach, x9 cones per side with intermittent UE support for balance correction. Noted significant increased difficulty when rotating to R side compared to L. Min A throughout for steadying assist  -Standing on rockerboard in A/P direction w/intermittent UE support for improved hip/ankle strategy and reactive balance techniques. Pt demonstrates heavy reliance on hip strategy for balance.      PATIENT EDUCATION: Education details: continue with current HEP, use of BUEs for counterbalance during sit <>stands Person educated: Patient Education method: Explanation, Demonstration, and Handouts Education comprehension: verbalized understanding   HOME EXERCISE PROGRAM: Access Code: [email or web address] URL: [email or web address] Date: 02/11/2022 Prepared by: Mickie Bail Janett Kamath  Exercises - Standing on foam in corner with eyes closed   - 1 x daily - 7 x weekly - 3 sets - 15-30 seconds hold - Standing on old pillows in corner with eyes open and feet close together  - 1 x daily - 7 x weekly - 3 sets - 15-30 second hold - Standing in corner with eyes closed and head turns  - 1 x daily - 7 x weekly - 3 sets - 10 reps - Sit to Stand with Counter Support  - 1 x daily - 7 x weekly - 3 sets - 10 reps    GOALS: Goals  reviewed with patient? Yes  SHORT TERM GOALS: Target date: 02/15/2022  Pt will be independent with initial HEP for improved strength, balance, transfers and gait. Baseline: 02/18/22: has program for home however has been inconsistent with performance Goal status: PARTIALLY MET  2. Pt will improve Berg score to 26/56 for decreased fall risk Baseline: 02/18/22: 31/56 scored today  Goal status: MET  3.  Pt will improve gait velocity to at least 2.46f/s w/LRAD for improved gait efficiency  Baseline: 2.217fs  Goal status: INITIAL  4.  Pt will improve 5 x STS to less than or equal to 25 seconds w/BUE support and reduced bracing on chair to demonstrate improved functional strength and transfer efficiency.  Baseline: 02/18/22: 12.13 sec's no UE support with bracing on chair surface Goal status: MET  5.  Pt will improve condition 4 of MCTSIB to >/= 15 seconds for improved dynamic balance  Baseline: 10s  Goal status: REVISED  LONG TERM GOALS: Target date: 03/08/2022  Pt will be independent with final HEP for improved strength, balance, transfers and gait. Baseline:  Goal status: INITIAL  2.  Pt will improve condition 4 of MCTSIB to >/= 20s for improved dynamic stability w/EC Baseline: 10s  Goal status: REVISED  3. Pt will improve Berg score to 31/56 for decreased fall risk Baseline: 22/56 Goal status: INITIAL  4.  Pt will improve gait velocity to at least  2.74f/s w/LRAD for improved gait efficiency  Baseline: 2.24 ft/s  Goal status: INITIAL  5.  Pt will improve 5 x STS to less than or equal to 21 seconds w/BUE support to demonstrate improved functional strength and transfer efficiency.  Baseline: 29.56s w/BUE support  Goal status: INITIAL  ASSESSMENT:  CLINICAL IMPRESSION: Emphasis of skilled PT session on BLE strength, transfers and reactive balance strategies. Pt performed sit <>stands well during session w/min cues to use BUEs as counterbalance. Pt continues to have difficulty  w/immediate standing balance and relies on bracing against sitting surface for stability. However, w/use of arms as counterbalance, pt able to stabilize independently. Pt able to stand on rocker board without LOB today but had significant difficulty w/reaching out of BOS, especially to R side. Continue POC.       OBJECTIVE IMPAIRMENTS Abnormal gait, decreased activity tolerance, decreased balance, decreased endurance, decreased mobility, difficulty walking, impaired sensation, improper body mechanics, and pain.   ACTIVITY LIMITATIONS carrying, lifting, bending, squatting, stairs, transfers, reach over head, and locomotion level  PARTICIPATION LIMITATIONS: meal prep, cleaning, laundry, driving, shopping, community activity, occupation, and yard work  PERSONAL FACTORS Age, Fitness, Past/current experiences, and 1-2 comorbidities: severe OA in B shoulders and bilateral peripheral neuropathy  are also affecting patient's functional outcome.   REHAB POTENTIAL: Fair due to time since onset of neuropathy and sedentary lifestyle  CLINICAL DECISION MAKING: Stable/uncomplicated  EVALUATION COMPLEXITY: Low  PLAN: PT FREQUENCY: 2x/week  PT DURATION: 6 weeks  PLANNED INTERVENTIONS: Therapeutic exercises, Therapeutic activity, Neuromuscular re-education, Balance training, Gait training, Patient/Family education, Self Care, Stair training, Orthotic/Fit training, DME instructions, Aquatic Therapy, and Re-evaluation  PLAN FOR NEXT SESSION:  10th visit PN. add to HEP for improved transfers and balance deficits. Sit <>stands, any dynamic balance/endurance, compensation strategies for proprioception deficits, hip strategy techniques, rotation/reaching tasks     JCharlett Nose PT, DFairfax9569 St Paul DriveSLake VillaGBardwell [place]  [id number]:  [phone]:  [phone]/06/23, 3:35 PM

## 2022-03-03 ENCOUNTER — Ambulatory Visit: Payer: Medicare PPO | Admitting: Physical Therapy

## 2022-03-03 DIAGNOSIS — K610 Anal abscess: Secondary | ICD-10-CM | POA: Diagnosis not present

## 2022-03-04 ENCOUNTER — Ambulatory Visit: Payer: Medicare PPO | Admitting: Physical Therapy

## 2022-03-04 DIAGNOSIS — M6281 Muscle weakness (generalized): Secondary | ICD-10-CM

## 2022-03-04 DIAGNOSIS — R2689 Other abnormalities of gait and mobility: Secondary | ICD-10-CM

## 2022-03-04 DIAGNOSIS — M545 Low back pain, unspecified: Secondary | ICD-10-CM | POA: Diagnosis not present

## 2022-03-04 DIAGNOSIS — R262 Difficulty in walking, not elsewhere classified: Secondary | ICD-10-CM | POA: Diagnosis not present

## 2022-03-04 DIAGNOSIS — M79604 Pain in right leg: Secondary | ICD-10-CM | POA: Diagnosis not present

## 2022-03-04 DIAGNOSIS — R2681 Unsteadiness on feet: Secondary | ICD-10-CM | POA: Diagnosis not present

## 2022-03-04 NOTE — Therapy (Signed)
OUTPATIENT PHYSICAL THERAPY NEURO TREATMENT- 10TH VISIT PROGRESS NOTE   Patient Name: Albert Barr MRN: [id number] DOB:[birth year], 81 y.o., male Today's Date: 03/04/2022   PCP: Reynold Bowen, MD  REFERRING PROVIDER: Stefanie Libel, MD   Physical Therapy Progress Note   Dates of Reporting Period:01/25/22 - 03/04/22  See Note below for Objective Data and Assessment of Progress/Goals.  Thank you for the referral of this patient. Mickie Bail Haden Suder, PT, DPT     PT End of Session - 03/04/22 1113     Visit Number 10    Number of Visits 13   Plus eval   Date for PT Re-Evaluation 03/15/22    Authorization Type [employer]    Authorization Time Period 01/25/22-03/15/22 13 visits approved    Authorization - Visit Number 8   9/1- updated number   Authorization - Number of Visits 13    Progress Note Due on Visit 10    PT Start Time 1110   Pt arrived late   PT Stop Time 1147    PT Time Calculation (min) 37 min    Equipment Utilized During Treatment Gait belt;Other (comment)   Bilateral AFOs   Activity Tolerance Patient tolerated treatment well    Behavior During Therapy WFL for tasks assessed/performed                  Past Medical History:  Diagnosis Date   Allergy    Anal fissure    Arthritis    shoulder, knees   Asthma    Atrophic gastritis without mention of hemorrhage    Cataract    bilateraly   Esophageal reflux    Family history of gastric cancer    Fibula fracture    hair line fracture   Gastric polyps    history   Hiatal hernia    Hx of adenomatous colonic polyps    Hyperlipidemia    Hypertension    Metaplasia of esophagus 2011   "gastric metaplasia"   Morbid obesity ([place])    Obstructive sleep apnea on CPAP    Other voice and resonance disorders    Peripheral neuropathy    Polyposis syndrome gastric and colonic - attenuated 12/09/2015   Sleep apnea    uses CPAP   Squamous cell skin cancer 04/2018   lesion left forearm   Unspecified asthma(493.90)     Past Surgical History:  Procedure Laterality Date   APPENDECTOMY     CHOLECYSTECTOMY     COLONOSCOPY  08/20/2009 (multiple)   Tubular adenoma   ESOPHAGOGASTRODUODENOSCOPY  08/20/09 (multiple)   INCISION AND DRAINAGE PERIRECTAL ABSCESS  2007   KNEE SURGERY     arthroscopy- both knees   POLYPECTOMY     UPPER GASTROINTESTINAL ENDOSCOPY     Patient Active Problem List   Diagnosis Date Noted   Primary osteoarthritis of both shoulders 01/18/2022   Leg edema, left 03/03/2020   Bilateral swelling of feet and ankles 12/18/2018   Acute right ankle pain 09/18/2018   Foot drop, bilateral 01/25/2018   Morbid obesity with body mass index of 40.0-44.9 in adult [facility]) 12/04/2017   Genetic testing 03/03/2016   Polyposis syndrome gastric and colonic - attenuated 12/09/2015   Pre-operative clearance 11/13/2015   Essential hypertension 11/13/2015   NS (nuclear sclerosis) 03/11/2015   Degenerative arthritis of lumbar spine 09/03/2014   Lumbar and sacral osteoarthritis 09/03/2014   Ocular rosacea 08/19/2014   Incomplete rotator cuff tear 07/22/2014   Conjunctival chalasis 02/12/2014   Dry eye syndrome 02/12/2014  Dry eye 02/12/2014   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   Disease of eyelid 02/12/2014   SPL (spondylolisthesis) 11/27/2013   Family history of gastric cancer 05/16/2013   Family history of cancer of digestive organ 05/16/2013   Imbalance 06/14/2012   Peripheral neuropathic pain 06/14/2012   Neuropathy 06/14/2012   Glaucoma suspect 11/03/2011   Cataract, nuclear 11/03/2011   Posterior vitreous detachment 11/03/2011   Hole, retinal 11/03/2011   Ache in joint 05/26/2011   Gastro-esophageal reflux disease without esophagitis 05/26/2011   Acid reflux 05/26/2011   Hypercholesterolemia 05/26/2011   Hyperlipidemia 05/27/2009   Morbid obesity ([place]) 05/27/2009   GERD 09/11/2008   History of colonic polyps 09/11/2008   Multiple gastric polyps 09/11/2008   H/O disease 09/11/2008    HOARSENESS 09/10/2008   SLEEP APNEA 11/05/2007   ASTHMA 10/12/2007   Airway hyperreactivity 10/12/2007   GASTRITIS, CHRONIC 08/01/2006   AG (atrophic gastritis) 08/01/2006    ONSET DATE: 01/18/2022 (referral)   REFERRING DIAG: M79.2 (ICD-10-CM) - Peripheral neuropathic pain   THERAPY DIAG:  Other abnormalities of gait and mobility  Difficulty in walking, not elsewhere classified  Muscle weakness (generalized)  Rationale for Evaluation and Treatment Rehabilitation   PERTINENT HISTORY: B neuropathy with B AFOs, Ashma, skin CA, sleep apnea with CPAP, morbid obesity, HLD, OA, R fib fx (2020) after fall on concrete. He was diagnosed with idiopathic neuropathy in 2013, originally seen by a spine doctor who didn't find anything wrong with his spine besides mild disc disease. He is having more pain in his spine recently going down his right leg that will get better with changing positions. Usually happens after sleeping in the morning. Has not tried PT for this problem. He says he first started having neuropathy symptoms in 2008. He was at [employer] for [employer] and had trouble walking down a hill after a long walk to the ceremony. At that time he figured it was related to his knees. He went to therapy for this but the therapist noticed he was having some neuropathy symptoms and he was sent for evaluation. He describes both numbness and weakness. He says he hasn't completely lost feeling in either foot. He did get AFOs about 5 years ago. His lower legs are the biggest problems, his quadriceps seem to be strong.   PRECAUTIONS: Fall   PATIENT GOALS: "Maintaining Balance"                                                                                                                                                                                              SUBJECTIVE STATEMENT: Pt reports he is  doing well, no new changes   Pt accompanied by: self  PAIN:  Are you having pain?  Yes: NPRS scale: 3/10 Pain location: Bilateral knees and shoulders Pain description: Achy/OA   OBJECTIVE:    SENSATION: Bilateral peripheral neuropathy in feet   TODAY'S SESSION:  Ther Act   [facility] PT Assessment - 03/04/22 1121       Ambulation/Gait   Gait velocity 32.8' over 13.62s without AD = 2.4 ft/s             Ther Ex  Updated initial HEP (see bolded below) to incorporate single leg stability and postural control exercises:   - Standing Single Leg Stance in corner w/chair in front of patient for added stability. Pt unable to perform well without BUE support, challenged pt to perform w/unilateral UE support for added challenge to progress exercise at home   - Standing March in corner w/chair in front of pt for added stability. Pt performs very quick marches due to poor stability, challenged pt to hold march position for 1-2s for increased single leg stability and hip flexor strength w/BUE support   NMR  Standing on [employer] in front of corner w/chair in front of pt for stability, pt performed ball tosses using purple ball w/therapist for reactive balance strategies, postural control and lateral reaching stability. Pt unable to perform unless focused on stationary visual target and was able to perform 1-3 tosses prior to losing balance posteriorly into wall or holding onto chair despite visual target.     PATIENT EDUCATION: Education details: Updates to HEP Person educated: Patient Education method: Explanation, Media planner, and Handouts Education comprehension: verbalized understanding   HOME EXERCISE PROGRAM: Access Code: [email or web address] URL: [email or web address] Date: 02/11/2022 Prepared by: Mickie Bail Raistlin Gum  Exercises - Standing on foam in corner with eyes closed   - 1 x daily - 7 x weekly - 3 sets - 15-30 seconds hold - Standing on old pillows in corner with eyes open and feet close together  - 1 x daily - 7 x weekly - 3 sets - 15-30 second hold -  Standing in corner with eyes closed and head turns  - 1 x daily - 7 x weekly - 3 sets - 10 reps - Sit to Stand with Counter Support  - 1 x daily - 7 x weekly - 3 sets - 10 reps - Standing Single Leg Stance with Unilateral Counter Support  - 1 x daily - 7 x weekly - 1 sets - 5 reps - 20-30 second hold - Standing March with Counter Support  - 1 x daily - 7 x weekly - 3 sets - 10 reps    GOALS: Goals reviewed with patient? Yes  SHORT TERM GOALS: Target date: 02/15/2022  Pt will be independent with initial HEP for improved strength, balance, transfers and gait. Baseline: 02/18/22: has program for home however has been inconsistent with performance Goal status: PARTIALLY MET  2. Pt will improve Berg score to 26/56 for decreased fall risk Baseline: 02/18/22: 31/56 scored today  Goal status: MET  3.  Pt will improve gait velocity to at least 2.52f/s w/LRAD for improved gait efficiency  Baseline: 2.296fs; 2.4 ft/s  Goal status: IN PROGRESS  4.  Pt will improve 5 x STS to less than or equal to 25 seconds w/BUE support and reduced bracing on chair to demonstrate improved functional strength and transfer efficiency.  Baseline: 02/18/22: 12.13 sec's no UE support with bracing on chair surface Goal status: MET  5.  Pt will improve condition 4 of MCTSIB to >/= 15 seconds for improved dynamic balance  Baseline: 10s  Goal status: REVISED  LONG TERM GOALS: Target date: 03/08/2022  Pt will be independent with final HEP for improved strength, balance, transfers and gait. Baseline:  Goal status: INITIAL  2.  Pt will improve condition 4 of MCTSIB to >/= 20s for improved dynamic stability w/EC Baseline: 10s  Goal status: REVISED  3. Pt will improve Berg score to 31/56 for decreased fall risk Baseline: 22/56 Goal status: INITIAL  4.  Pt will improve gait velocity to at least 2.65f/s w/LRAD for improved gait efficiency  Baseline: 2.24 ft/s  Goal status: INITIAL  5.  Pt will improve 5 x STS to  less than or equal to 21 seconds w/BUE support to demonstrate improved functional strength and transfer efficiency.  Baseline: 29.56s w/BUE support  Goal status: INITIAL  ASSESSMENT:  CLINICAL IMPRESSION: Emphasis of skilled PT session on updating HEP for added single leg stability and reactive balance strategies. Pt has improved his walking speed from 2.2 ft/s to 2.4 ft/s, indicative of improved balance and confidence with gait. Pt continues to rely heavily on UE support for stability but can initiate hip strategy with perturbations. Continue POC.    OBJECTIVE IMPAIRMENTS Abnormal gait, decreased activity tolerance, decreased balance, decreased endurance, decreased mobility, difficulty walking, impaired sensation, improper body mechanics, and pain.   ACTIVITY LIMITATIONS carrying, lifting, bending, squatting, stairs, transfers, reach over head, and locomotion level  PARTICIPATION LIMITATIONS: meal prep, cleaning, laundry, driving, shopping, community activity, occupation, and yard work  PERSONAL FACTORS Age, Fitness, Past/current experiences, and 1-2 comorbidities: severe OA in B shoulders and bilateral peripheral neuropathy  are also affecting patient's functional outcome.   REHAB POTENTIAL: Fair due to time since onset of neuropathy and sedentary lifestyle  CLINICAL DECISION MAKING: Stable/uncomplicated  EVALUATION COMPLEXITY: Low  PLAN: PT FREQUENCY: 2x/week  PT DURATION: 6 weeks  PLANNED INTERVENTIONS: Therapeutic exercises, Therapeutic activity, Neuromuscular re-education, Balance training, Gait training, Patient/Family education, Self Care, Stair training, Orthotic/Fit training, DME instructions, Aquatic Therapy, and Re-evaluation  PLAN FOR NEXT SESSION:  Land: goal assessment and DWeeksvillePlaster, PT, [id number]SCrested ButteGGrand Junction [place]  [id number]:  [phone]:  [phone]/08/23, 11:58 AM

## 2022-03-07 ENCOUNTER — Ambulatory Visit: Payer: Medicare PPO | Admitting: Physical Therapy

## 2022-03-08 ENCOUNTER — Ambulatory Visit: Payer: Medicare PPO | Admitting: Physical Therapy

## 2022-03-08 ENCOUNTER — Ambulatory Visit: Payer: Self-pay | Admitting: Physical Therapy

## 2022-03-08 ENCOUNTER — Encounter: Payer: Self-pay | Admitting: Physical Therapy

## 2022-03-08 DIAGNOSIS — M6281 Muscle weakness (generalized): Secondary | ICD-10-CM | POA: Diagnosis not present

## 2022-03-08 DIAGNOSIS — M79604 Pain in right leg: Secondary | ICD-10-CM | POA: Diagnosis not present

## 2022-03-08 DIAGNOSIS — R2689 Other abnormalities of gait and mobility: Secondary | ICD-10-CM | POA: Diagnosis not present

## 2022-03-08 DIAGNOSIS — M545 Low back pain, unspecified: Secondary | ICD-10-CM | POA: Diagnosis not present

## 2022-03-08 DIAGNOSIS — R262 Difficulty in walking, not elsewhere classified: Secondary | ICD-10-CM

## 2022-03-08 DIAGNOSIS — R2681 Unsteadiness on feet: Secondary | ICD-10-CM | POA: Diagnosis not present

## 2022-03-08 NOTE — Therapy (Signed)
OUTPATIENT PHYSICAL THERAPY NEURO TREATMENT- 10TH VISIT PROGRESS NOTE   Patient Name: Albert Barr MRN: [id number] DOB:04-02-1941, 81 y.o., male Today's Date: 03/08/2022   PCP: Reynold Bowen, MD  REFERRING PROVIDER: Stefanie Libel, MD   Physical Therapy Progress Note   Dates of Reporting Period:01/25/22 - 03/04/22  See Note below for Objective Data and Assessment of Progress/Goals.  Thank you for the referral of this patient. Mickie Bail Plaster, PT, DPT     PT End of Session - 03/08/22 1602     Visit Number 11    Number of Visits 13   Plus eval   Date for PT Re-Evaluation 03/15/22    Authorization Type [employer]    Authorization Time Period 01/25/22-03/15/22 13 visits approved    Authorization - Visit Number 11   9/1- updated number   Authorization - Number of Visits 13    Progress Note Due on Visit 71    PT Start Time 0935    PT Stop Time 6301    PT Time Calculation (min) 40 min    Equipment Utilized During Treatment Other (comment)   single bar bells, aquatic cuffs   Activity Tolerance Patient tolerated treatment well    Behavior During Therapy WFL for tasks assessed/performed                  Past Medical History:  Diagnosis Date   Allergy    Anal fissure    Arthritis    shoulder, knees   Asthma    Atrophic gastritis without mention of hemorrhage    Cataract    bilateraly   Esophageal reflux    Family history of gastric cancer    Fibula fracture    hair line fracture   Gastric polyps    history   Hiatal hernia    Hx of adenomatous colonic polyps    Hyperlipidemia    Hypertension    Metaplasia of esophagus 2011   "gastric metaplasia"   Morbid obesity ([place])    Obstructive sleep apnea on CPAP    Other voice and resonance disorders    Peripheral neuropathy    Polyposis syndrome gastric and colonic - attenuated 12/09/2015   Sleep apnea    uses CPAP   Squamous cell skin cancer 04/2018   lesion left forearm   Unspecified asthma(493.90)     Past Surgical History:  Procedure Laterality Date   APPENDECTOMY     CHOLECYSTECTOMY     COLONOSCOPY  08/20/2009 (multiple)   Tubular adenoma   ESOPHAGOGASTRODUODENOSCOPY  08/20/09 (multiple)   INCISION AND DRAINAGE PERIRECTAL ABSCESS  2007   KNEE SURGERY     arthroscopy- both knees   POLYPECTOMY     UPPER GASTROINTESTINAL ENDOSCOPY     Patient Active Problem List   Diagnosis Date Noted   Primary osteoarthritis of both shoulders 01/18/2022   Leg edema, left 03/03/2020   Bilateral swelling of feet and ankles 12/18/2018   Acute right ankle pain 09/18/2018   Foot drop, bilateral 01/25/2018   Morbid obesity with body mass index of 40.0-44.9 in adult [facility]) 12/04/2017   Genetic testing 03/03/2016   Polyposis syndrome gastric and colonic - attenuated 12/09/2015   Pre-operative clearance 11/13/2015   Essential hypertension 11/13/2015   NS (nuclear sclerosis) 03/11/2015   Degenerative arthritis of lumbar spine 09/03/2014   Lumbar and sacral osteoarthritis 09/03/2014   Ocular rosacea 08/19/2014   Incomplete rotator cuff tear 07/22/2014   Conjunctival chalasis 02/12/2014   Dry eye syndrome 02/12/2014  Dry eye 02/12/2014   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   Disease of eyelid 02/12/2014   SPL (spondylolisthesis) 11/27/2013   Family history of gastric cancer 05/16/2013   Family history of cancer of digestive organ 05/16/2013   Imbalance 06/14/2012   Peripheral neuropathic pain 06/14/2012   Neuropathy 06/14/2012   Glaucoma suspect 11/03/2011   Cataract, nuclear 11/03/2011   Posterior vitreous detachment 11/03/2011   Hole, retinal 11/03/2011   Ache in joint 05/26/2011   Gastro-esophageal reflux disease without esophagitis 05/26/2011   Acid reflux 05/26/2011   Hypercholesterolemia 05/26/2011   Hyperlipidemia 05/27/2009   Morbid obesity ([place]) 05/27/2009   GERD 09/11/2008   History of colonic polyps 09/11/2008   Multiple gastric polyps 09/11/2008   H/O disease 09/11/2008    HOARSENESS 09/10/2008   SLEEP APNEA 11/05/2007   ASTHMA 10/12/2007   Airway hyperreactivity 10/12/2007   GASTRITIS, CHRONIC 08/01/2006   AG (atrophic gastritis) 08/01/2006    ONSET DATE: 01/18/2022 (referral)   REFERRING DIAG: M79.2 (ICD-10-CM) - Peripheral neuropathic pain   THERAPY DIAG:  Other abnormalities of gait and mobility  Difficulty in walking, not elsewhere classified  Muscle weakness (generalized)  Rationale for Evaluation and Treatment Rehabilitation   PERTINENT HISTORY: B neuropathy with B AFOs, Ashma, skin CA, sleep apnea with CPAP, morbid obesity, HLD, OA, R fib fx (2020) after fall on concrete. He was diagnosed with idiopathic neuropathy in 2013, originally seen by a spine doctor who didn't find anything wrong with his spine besides mild disc disease. He is having more pain in his spine recently going down his right leg that will get better with changing positions. Usually happens after sleeping in the morning. Has not tried PT for this problem. He says he first started having neuropathy symptoms in 2008. He was at [employer] for [employer] and had trouble walking down a hill after a long walk to the ceremony. At that time he figured it was related to his knees. He went to therapy for this but the therapist noticed he was having some neuropathy symptoms and he was sent for evaluation. He describes both numbness and weakness. He says he hasn't completely lost feeling in either foot. He did get AFOs about 5 years ago. His lower legs are the biggest problems, his quadriceps seem to be strong.   PRECAUTIONS: Fall   PATIENT GOALS: "Maintaining Balance"                                                                                                                                                                                              SUBJECTIVE STATEMENT: No new complaints. No  falls.   Pt accompanied by: self  PAIN:  Are you having pain? Yes: NPRS scale:  3/10 Pain location: Bilateral knees and shoulders Pain description: Achy/OA   OBJECTIVE:    SENSATION: Bilateral peripheral neuropathy in feet   TODAY'S SESSION:  Aquatic therapy at [facility] - pool temp 90 degrees   Patient seen for aquatic therapy today.  Treatment took place in water 3.5-4.5 feet deep depending upon activity.  Pt entered/exited pool via stairs with bil rails.   Gait into pool to deeper water of ~4.0-4.3 foot depth with SBA, no AD. Forward gait across pool for 18 feet x 8 laps Backward gait across pool for 18 feet x 6 laps Side stepping left<>right for 18 feet across pool x 5 laps each way   At wall (~4.0 foot depth) with UE support at as needed, aquatic cuffs to bil LE's: 20 reps each side of the following with cues on form/technique. Heel/toe raises Alternating slow marching Alternating hip ab/add Alternating hip extension Alternating HS curls     With single bar bells in ~4.0-4.3 foot water depth with SBA as needed for balance Forward mini lunges while moving arms out<>in for 18 feet across pool x 6 laps Marching with contralateral hand taps to knee each time for 18 feet across pool x 6 laps  At wall in ~4.0-4.3 foot water depth with single bar bells: wall behind pt for safety (not touching) With arms extended moving in horizontal abduction/adduction at water surface for 10 reps Holding arms straight out forward in extension: alternating lowering one hand down under water<>back to water surface for 10 reps each side Holding arms straight out at sides in extension: alternating lowering one hand down under water<>back to water surface for 10 reps each side.      Pt requires buoyancy of water for support for reduced fall risk with gait training and balance exercises with minimal UE support; exercises able to be performed safely in water without the risk of fall compared to those same exercises performed on land;  viscosity of water needed for resistance for  strengthening.  Current of water provides perturbations for challenging static & dynamic standing balance.         PATIENT EDUCATION: Education details:  aquatic HEP to be issued at final session (land) Person educated: Patient Education method: Consulting civil engineer, Media planner, and Handouts Education comprehension: verbalized understanding   HOME EXERCISE PROGRAM: Access Code: [email or web address] URL: [email or web address] Date: 02/11/2022 Prepared by: Mickie Bail Plaster  Exercises - Standing on foam in corner with eyes closed   - 1 x daily - 7 x weekly - 3 sets - 15-30 seconds hold - Standing on old pillows in corner with eyes open and feet close together  - 1 x daily - 7 x weekly - 3 sets - 15-30 second hold - Standing in corner with eyes closed and head turns  - 1 x daily - 7 x weekly - 3 sets - 10 reps - Sit to Stand with Counter Support  - 1 x daily - 7 x weekly - 3 sets - 10 reps - Standing Single Leg Stance with Unilateral Counter Support  - 1 x daily - 7 x weekly - 1 sets - 5 reps - 20-30 second hold - Standing March with Counter Support  - 1 x daily - 7 x weekly - 3 sets - 10 reps    GOALS: Goals reviewed with patient? Yes  SHORT TERM GOALS: Target date: 02/15/2022  Pt will be independent with  initial HEP for improved strength, balance, transfers and gait. Baseline: 02/18/22: has program for home however has been inconsistent with performance Goal status: PARTIALLY MET  2. Pt will improve Berg score to 26/56 for decreased fall risk Baseline: 02/18/22: 31/56 scored today  Goal status: MET  3.  Pt will improve gait velocity to at least 2.51f/s w/LRAD for improved gait efficiency  Baseline: 2.237fs; 2.4 ft/s  Goal status: IN PROGRESS  4.  Pt will improve 5 x STS to less than or equal to 25 seconds w/BUE support and reduced bracing on chair to demonstrate improved functional strength and transfer efficiency.  Baseline: 02/18/22: 12.13 sec's no UE support with bracing on  chair surface Goal status: MET  5.  Pt will improve condition 4 of MCTSIB to >/= 15 seconds for improved dynamic balance  Baseline: 10s  Goal status: REVISED  LONG TERM GOALS: Target date: 03/08/2022  Pt will be independent with final HEP for improved strength, balance, transfers and gait. Baseline:  Goal status: INITIAL  2.  Pt will improve condition 4 of MCTSIB to >/= 20s for improved dynamic stability w/EC Baseline: 10s  Goal status: REVISED  3. Pt will improve Berg score to 31/56 for decreased fall risk Baseline: 22/56 Goal status: INITIAL  4.  Pt will improve gait velocity to at least 2.26f53f w/LRAD for improved gait efficiency  Baseline: 2.24 ft/s  Goal status: INITIAL  5.  Pt will improve 5 x STS to less than or equal to 21 seconds w/BUE support to demonstrate improved functional strength and transfer efficiency.  Baseline: 29.56s w/BUE support  Goal status: INITIAL  ASSESSMENT:  CLINICAL IMPRESSION: Today's skilled session continued to focus on finalizing aquatic program with no issues noted or reported. Will have laminated written program for pt at final land visit.    OBJECTIVE IMPAIRMENTS Abnormal gait, decreased activity tolerance, decreased balance, decreased endurance, decreased mobility, difficulty walking, impaired sensation, improper body mechanics, and pain.   ACTIVITY LIMITATIONS carrying, lifting, bending, squatting, stairs, transfers, reach over head, and locomotion level  PARTICIPATION LIMITATIONS: meal prep, cleaning, laundry, driving, shopping, community activity, occupation, and yard work  PERSONAL FACTORS Age, Fitness, Past/current experiences, and 1-2 comorbidities: severe OA in B shoulders and bilateral peripheral neuropathy  are also affecting patient's functional outcome.   REHAB POTENTIAL: Fair due to time since onset of neuropathy and sedentary lifestyle  CLINICAL DECISION MAKING: Stable/uncomplicated  EVALUATION COMPLEXITY:  Low  PLAN: PT FREQUENCY: 2x/week  PT DURATION: 6 weeks  PLANNED INTERVENTIONS: Therapeutic exercises, Therapeutic activity, Neuromuscular re-education, Balance training, Gait training, Patient/Family education, Self Care, Stair training, Orthotic/Fit training, DME instructions, Aquatic Therapy, and Re-evaluation  PLAN FOR NEXT SESSION:  Land: goal assessment and DC. Give pt aquatic HEP    KatWillow OraTA, CLTCoffee County Center For Digestive Diseases LLCtpatient Neuro RehSouthwest Medical Associates Inc [facility] [id number]ville[place]aucier[place] [id number]/12/23, 4:03 PM

## 2022-03-09 ENCOUNTER — Ambulatory Visit: Payer: Medicare PPO | Admitting: Physical Therapy

## 2022-03-09 ENCOUNTER — Ambulatory Visit: Payer: Self-pay | Admitting: Physical Therapy

## 2022-03-10 ENCOUNTER — Ambulatory Visit: Payer: Medicare PPO | Admitting: Physical Therapy

## 2022-03-11 ENCOUNTER — Ambulatory Visit: Payer: Self-pay | Admitting: Physical Therapy

## 2022-03-11 ENCOUNTER — Ambulatory Visit: Payer: Medicare PPO | Admitting: Physical Therapy

## 2022-03-15 ENCOUNTER — Ambulatory Visit: Payer: Self-pay | Admitting: Physical Therapy

## 2022-03-15 ENCOUNTER — Ambulatory Visit: Payer: Medicare PPO | Admitting: Physical Therapy

## 2022-03-15 DIAGNOSIS — R2681 Unsteadiness on feet: Secondary | ICD-10-CM | POA: Diagnosis not present

## 2022-03-15 DIAGNOSIS — M545 Low back pain, unspecified: Secondary | ICD-10-CM | POA: Diagnosis not present

## 2022-03-15 DIAGNOSIS — R262 Difficulty in walking, not elsewhere classified: Secondary | ICD-10-CM | POA: Diagnosis not present

## 2022-03-15 DIAGNOSIS — R2689 Other abnormalities of gait and mobility: Secondary | ICD-10-CM

## 2022-03-15 DIAGNOSIS — M79604 Pain in right leg: Secondary | ICD-10-CM | POA: Diagnosis not present

## 2022-03-15 DIAGNOSIS — M6281 Muscle weakness (generalized): Secondary | ICD-10-CM | POA: Diagnosis not present

## 2022-03-15 NOTE — Therapy (Signed)
OUTPATIENT PHYSICAL THERAPY NEURO TREATMENT- DISCHARGE SUMMARY   Patient Name: Albert Barr MRN: [id number] DOB:02-17-1941, 81 y.o., male Today's Date: 03/15/2022   PCP: Reynold Bowen, MD  REFERRING PROVIDER: Stefanie Libel, MD   PHYSICAL THERAPY DISCHARGE SUMMARY  Visits from Start of Care: 12  Current functional level related to goals / functional outcomes: See below    Remaining deficits: Gait abnormalities 2/2 peripheral neuropathy, decreased activity tolerance, decreased balance    Education / Equipment: Land and aquatic HEP   Patient agrees to discharge. Patient goals were partially met. Patient is being discharged due to being pleased with the current functional level.     PT End of Session - 03/15/22 1232     Visit Number 12    Number of Visits 13   Plus eval   Date for PT Re-Evaluation 03/15/22    Authorization Type [employer]    Authorization Time Period 01/25/22-03/15/22 13 visits approved    Authorization - Visit Number 11   9/1- updated number   Authorization - Number of Visits 13    Progress Note Due on Visit 20    PT Start Time 3570    PT Stop Time 1255   DC   PT Time Calculation (min) 25 min    Equipment Utilized During Treatment Other (comment)   Bilateral blue rocker AFOs   Activity Tolerance Patient tolerated treatment well    Behavior During Therapy WFL for tasks assessed/performed                   Past Medical History:  Diagnosis Date   Allergy    Anal fissure    Arthritis    shoulder, knees   Asthma    Atrophic gastritis without mention of hemorrhage    Cataract    bilateraly   Esophageal reflux    Family history of gastric cancer    Fibula fracture    hair line fracture   Gastric polyps    history   Hiatal hernia    Hx of adenomatous colonic polyps    Hyperlipidemia    Hypertension    Metaplasia of esophagus 2011   "gastric metaplasia"   Morbid obesity ([place])    Obstructive sleep apnea on CPAP    Other voice  and resonance disorders    Peripheral neuropathy    Polyposis syndrome gastric and colonic - attenuated 12/09/2015   Sleep apnea    uses CPAP   Squamous cell skin cancer 04/2018   lesion left forearm   Unspecified asthma(493.90)    Past Surgical History:  Procedure Laterality Date   APPENDECTOMY     CHOLECYSTECTOMY     COLONOSCOPY  08/20/2009 (multiple)   Tubular adenoma   ESOPHAGOGASTRODUODENOSCOPY  08/20/09 (multiple)   INCISION AND DRAINAGE PERIRECTAL ABSCESS  2007   KNEE SURGERY     arthroscopy- both knees   POLYPECTOMY     UPPER GASTROINTESTINAL ENDOSCOPY     Patient Active Problem List   Diagnosis Date Noted   Primary osteoarthritis of both shoulders 01/18/2022   Leg edema, left 03/03/2020   Bilateral swelling of feet and ankles 12/18/2018   Acute right ankle pain 09/18/2018   Foot drop, bilateral 01/25/2018   Morbid obesity with body mass index of 40.0-44.9 in adult [facility]) 12/04/2017   Genetic testing 03/03/2016   Polyposis syndrome gastric and colonic - attenuated 12/09/2015   Pre-operative clearance 11/13/2015   Essential hypertension 11/13/2015   NS (nuclear sclerosis) 03/11/2015   Degenerative arthritis  of lumbar spine 09/03/2014   Lumbar and sacral osteoarthritis 09/03/2014   Ocular rosacea 08/19/2014   Incomplete rotator cuff tear 07/22/2014   Conjunctival chalasis 02/12/2014   Dry eye syndrome 02/12/2014   Dry eye 02/12/2014   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   Disease of eyelid 02/12/2014   SPL (spondylolisthesis) 11/27/2013   Family history of gastric cancer 05/16/2013   Family history of cancer of digestive organ 05/16/2013   Imbalance 06/14/2012   Peripheral neuropathic pain 06/14/2012   Neuropathy 06/14/2012   Glaucoma suspect 11/03/2011   Cataract, nuclear 11/03/2011   Posterior vitreous detachment 11/03/2011   Hole, retinal 11/03/2011   Ache in joint 05/26/2011   Gastro-esophageal reflux disease without esophagitis 05/26/2011   Acid reflux  05/26/2011   Hypercholesterolemia 05/26/2011   Hyperlipidemia 05/27/2009   Morbid obesity ([place]) 05/27/2009   GERD 09/11/2008   History of colonic polyps 09/11/2008   Multiple gastric polyps 09/11/2008   H/O disease 09/11/2008   HOARSENESS 09/10/2008   SLEEP APNEA 11/05/2007   ASTHMA 10/12/2007   Airway hyperreactivity 10/12/2007   GASTRITIS, CHRONIC 08/01/2006   AG (atrophic gastritis) 08/01/2006    ONSET DATE: 01/18/2022 (referral)   REFERRING DIAG: M79.2 (ICD-10-CM) - Peripheral neuropathic pain   THERAPY DIAG:  Other abnormalities of gait and mobility  Unsteadiness on feet  Rationale for Evaluation and Treatment Rehabilitation   PERTINENT HISTORY: B neuropathy with B AFOs, [place], skin CA, sleep apnea with CPAP, morbid obesity, HLD, OA, R fib fx (2020) after fall on concrete. He was diagnosed with idiopathic neuropathy in 2013, originally seen by a spine doctor who didn't find anything wrong with his spine besides mild disc disease. He is having more pain in his spine recently going down his right leg that will get better with changing positions. Usually happens after sleeping in the morning. Has not tried PT for this problem. He says he first started having neuropathy symptoms in 2008. He was at [employer] for [employer] and had trouble walking down a hill after a long walk to the ceremony. At that time he figured it was related to his knees. He went to therapy for this but the therapist noticed he was having some neuropathy symptoms and he was sent for evaluation. He describes both numbness and weakness. He says he hasn't completely lost feeling in either foot. He did get AFOs about 5 years ago. His lower legs are the biggest problems, his quadriceps seem to be strong.   PRECAUTIONS: Fall   PATIENT GOALS: "Maintaining Balance"  SUBJECTIVE STATEMENT: No new changes or falls. HEP is going well. Went by the [facility] to check out the pool, is excited that they have lockers and showers. Plans on going to the [facility] following DC.    Pt accompanied by: self  PAIN:  Are you having pain? Yes: NPRS scale: 3/10 Pain location: Bilateral knees and shoulders Pain description: Achy/OA   OBJECTIVE:    SENSATION: Bilateral peripheral neuropathy in feet   TODAY'S SESSION: Therapeutic Activity  LTG Assessment    [facility] PT Assessment - 03/15/22 1236       Transfers   Five time sit to stand comments  13.65s without UE support, no lean against back of chair but noted retropulsion throughout      Ambulation/Gait   Gait velocity 32.8' over 13.03s = 2.19f/s without AD            MCTSIB: Condition 4: Avg of 3 trials: 6.5 sec  [facility] All pt's questions regarding aquatic and land HEP were addressed and pt verbalized understanding of how to obtain new PT referral if mobility needs change in future.     PATIENT EDUCATION: Education details:  Goal outcomes, aquatic HEP Person educated: Patient Education method: Explanation, Demonstration, and Handouts Education comprehension: verbalized understanding   HOME EXERCISE PROGRAM: Access Code: [email or web address]: [email or web address] Date: 02/11/2022 Prepared by: JMickie BailPlaster  Exercises - Standing on foam in corner with eyes closed   - 1 x daily - 7 x weekly - 3 sets - 15-30 seconds hold - Standing on old pillows in corner with eyes open and feet close together  - 1 x daily - 7 x weekly - 3 sets - 15-30 second hold - Standing in corner with eyes closed and head turns  - 1 x daily - 7 x weekly - 3 sets - 10 reps - Sit to Stand with Counter Support  - 1 x daily - 7 x weekly - 3 sets - 10 reps - Standing Single Leg Stance with Unilateral Counter Support  - 1 x  daily - 7 x weekly - 1 sets - 5 reps - 20-30 second hold - Standing March with Counter Support  - 1 x daily - 7 x weekly - 3 sets - 10 reps    GOALS: Goals reviewed with patient? Yes  SHORT TERM GOALS: Target date: 02/15/2022  Pt will be independent with initial HEP for improved strength, balance, transfers and gait. Baseline: 02/18/22: has program for home however has been inconsistent with performance Goal status: PARTIALLY MET  2. Pt will improve Berg score to 26/56 for decreased fall risk Baseline: 02/18/22: 31/56 scored today  Goal status: MET  3.  Pt will improve gait velocity to at least 2.534fs w/LRAD for improved gait efficiency  Baseline: 2.2434f; 2.4 ft/s  Goal status: IN PROGRESS  4.  Pt will improve 5 x STS to less than or equal to 25 seconds w/BUE support and reduced bracing on chair to demonstrate improved functional strength and transfer efficiency.  Baseline: 02/18/22: 12.13 sec's no UE support with bracing on chair surface Goal status: MET  5.  Pt will improve condition 4 of MCTSIB to >/= 15 seconds for improved dynamic balance  Baseline: 10s  Goal status: REVISED  LONG TERM GOALS: Target date: 03/08/2022  Pt will be independent with final HEP for improved strength, balance, transfers and gait. Baseline:  Goal status: MET  2.  Pt will improve condition 4  of MCTSIB to >/= 20s for improved dynamic stability w/EC Baseline: 10s; 6.5s  Goal status: NOT MET  3. Pt will improve Berg score to 31/56 for decreased fall risk Baseline: 22/56; 31/56 on 8/25 Goal status: MET  4.  Pt will improve gait velocity to at least 2.60f/s w/LRAD for improved gait efficiency  Baseline: 2.24 ft/s; 2.553fs without AD Goal status: NOT MET  5.  Pt will improve 5 x STS to less than or equal to 21 seconds w/BUE support to demonstrate improved functional strength and transfer efficiency.  Baseline: 29.56s w/BUE support; 13.65s without UE support  Goal status:  MET  ASSESSMENT:  CLINICAL IMPRESSION: Emphasis of skilled PT session LTG assessment and DC from PT. Pt has met 4/5 LTGs, significantly improving his 5x STS form and time as well as improving his Berg score, indicative of improved balance and strength. Pt did improve his gait velocity from eval, just not quite to goal level. Pt had significant difficulty performing condition 4 of MCTSIB today, relying heavily on hip strategy to recover balance and only holding for 4-6s at a time compared to 10s on eval. [place] and verbalized agreement to DC from therapy today.    OBJECTIVE IMPAIRMENTS Abnormal gait, decreased activity tolerance, decreased balance, decreased endurance, decreased mobility, difficulty walking, impaired sensation, improper body mechanics, and pain.   ACTIVITY LIMITATIONS carrying, lifting, bending, squatting, stairs, transfers, reach over head, and locomotion level  PARTICIPATION LIMITATIONS: meal prep, cleaning, laundry, driving, shopping, community activity, occupation, and yard work  PERSONAL FACTORS Age, Fitness, Past/current experiences, and 1-2 comorbidities: severe OA in B shoulders and bilateral peripheral neuropathy  are also affecting patient's functional outcome.   REHAB POTENTIAL: Fair due to time since onset of neuropathy and sedentary lifestyle  CLINICAL DECISION MAKING: Stable/uncomplicated  EVALUATION COMPLEXITY: Low  PLAN: PT FREQUENCY: 2x/week  PT DURATION: 6 weeks  PLANNED INTERVENTIONS: Therapeutic exercises, Therapeutic activity, Neuromuscular re-education, Balance training, Gait training, Patient/Family education, Self Care, Stair training, Orthotic/Fit training, DME instructions, Aquatic Therapy, and Re-evaluation    JaCharlett NosePT, [id number] S[address] Amerige StreetuMeadrDe SotoNC  [id number]:  [phone]:   [phone]/19/23, 1:01 PM

## 2022-03-23 DIAGNOSIS — H40053 Ocular hypertension, bilateral: Secondary | ICD-10-CM | POA: Diagnosis not present

## 2022-04-05 DIAGNOSIS — G609 Hereditary and idiopathic neuropathy, unspecified: Secondary | ICD-10-CM | POA: Diagnosis not present

## 2022-04-05 DIAGNOSIS — R200 Anesthesia of skin: Secondary | ICD-10-CM | POA: Diagnosis not present

## 2022-04-05 DIAGNOSIS — R202 Paresthesia of skin: Secondary | ICD-10-CM | POA: Diagnosis not present

## 2022-04-05 DIAGNOSIS — E538 Deficiency of other specified B group vitamins: Secondary | ICD-10-CM | POA: Diagnosis not present

## 2022-04-19 ENCOUNTER — Ambulatory Visit (INDEPENDENT_AMBULATORY_CARE_PROVIDER_SITE_OTHER): Payer: Medicare PPO | Admitting: Sports Medicine

## 2022-04-19 DIAGNOSIS — M21371 Foot drop, right foot: Secondary | ICD-10-CM | POA: Diagnosis not present

## 2022-04-19 DIAGNOSIS — M19012 Primary osteoarthritis, left shoulder: Secondary | ICD-10-CM | POA: Diagnosis not present

## 2022-04-19 DIAGNOSIS — M19011 Primary osteoarthritis, right shoulder: Secondary | ICD-10-CM

## 2022-04-19 DIAGNOSIS — M21372 Foot drop, left foot: Secondary | ICD-10-CM | POA: Diagnosis not present

## 2022-04-19 NOTE — Assessment & Plan Note (Signed)
He is doing very well using Wegovy with no significant side effects and excellent weight loss

## 2022-04-19 NOTE — Assessment & Plan Note (Signed)
His gait has improved substantially with his AFO braces We will continue using those

## 2022-04-19 NOTE — Progress Notes (Signed)
Chief complaint severe arthritis of both shoulders/bilateral foot drops with peripheral neuropathy  Patient comes to discuss the possibility of shoulder surgery because he continues to have nighttime shoulder pain that wakens him He has x-rays of showed advanced osteoarthritis of both shoulders He is very limited and that he cannot reach overhead or do some common activities because of the limitations of motion of his shoulders We discussed that his best option would be a reverse shoulder replacement in my opinion but he should see a shoulder specialist to discuss this further  Foot drop and gait issues These have improved substantially with the light weight AFO braces that we have prescribed for him He has had only 1 fall since he started using those He feels like it is allowing him to do some daily walking although he still has significant osteoarthritis of both knees that limits the amount of time he can walk  Morbid obesity This has responded dramatically to [facility] and he has lost a total of 60 pounds His appetite is well controlled and he feels like he is probably going to lose some additional weight although it has plateaued recently  Physical exam Pleasant obese white male in no acute distress BP 137/69   Ht 5' 8.5" (1.74 m)   Wt 255 lb (115.7 kg)   BMI 38.21 kg/m   Shoulder motion is significantly limited in most planes The most severe limitation is on elevation He really cannot get over 90 degrees of elevation on either the right or left shoulder He does get some pain at the extremes of motion  Knee examination shows limitation of full extension bilaterally His flexion is limited on the right but almost 120 degrees on the left  His gait reveals that the AFO braces give him fairly stable stance and correct any foot drop

## 2022-04-19 NOTE — Progress Notes (Signed)
Visit done by Dr fields

## 2022-04-19 NOTE — Assessment & Plan Note (Signed)
With significantly advanced arthritis I felt that he should at least seek some consultation from a shoulder replacement specialist I gave him the name of 3 of the prominent surgeons in our area I suggested that I would probably start with the dominant arm so he gets his use back faster  Return as needed

## 2022-05-02 ENCOUNTER — Telehealth: Payer: Self-pay | Admitting: *Deleted

## 2022-05-02 NOTE — Telephone Encounter (Signed)
Albert Letta Moynahan, MD Mon 05/09/22 @ 245p Ph: [phone]

## 2022-05-09 ENCOUNTER — Other Ambulatory Visit: Payer: Self-pay | Admitting: Sports Medicine

## 2022-05-09 DIAGNOSIS — M19011 Primary osteoarthritis, right shoulder: Secondary | ICD-10-CM | POA: Diagnosis not present

## 2022-05-09 DIAGNOSIS — M19012 Primary osteoarthritis, left shoulder: Secondary | ICD-10-CM | POA: Diagnosis not present

## 2022-05-10 DIAGNOSIS — Z23 Encounter for immunization: Secondary | ICD-10-CM | POA: Diagnosis not present

## 2022-05-13 ENCOUNTER — Other Ambulatory Visit: Payer: Self-pay

## 2022-05-13 MED ORDER — GABAPENTIN 300 MG PO CAPS: 600.0000 mg | ORAL_CAPSULE | Freq: Three times a day (TID) | ORAL | 2 refills | Status: DC

## 2022-06-01 DIAGNOSIS — H40013 Open angle with borderline findings, low risk, bilateral: Secondary | ICD-10-CM | POA: Diagnosis not present

## 2022-06-01 DIAGNOSIS — H02423 Myogenic ptosis of bilateral eyelids: Secondary | ICD-10-CM | POA: Diagnosis not present

## 2022-06-01 DIAGNOSIS — H02831 Dermatochalasis of right upper eyelid: Secondary | ICD-10-CM | POA: Diagnosis not present

## 2022-06-01 DIAGNOSIS — H02422 Myogenic ptosis of left eyelid: Secondary | ICD-10-CM | POA: Diagnosis not present

## 2022-06-01 DIAGNOSIS — H04123 Dry eye syndrome of bilateral lacrimal glands: Secondary | ICD-10-CM | POA: Diagnosis not present

## 2022-06-01 DIAGNOSIS — H02834 Dermatochalasis of left upper eyelid: Secondary | ICD-10-CM | POA: Diagnosis not present

## 2022-06-02 ENCOUNTER — Other Ambulatory Visit: Payer: Self-pay | Admitting: Sports Medicine

## 2022-06-28 DIAGNOSIS — E785 Hyperlipidemia, unspecified: Secondary | ICD-10-CM | POA: Diagnosis not present

## 2022-06-28 DIAGNOSIS — G4730 Sleep apnea, unspecified: Secondary | ICD-10-CM | POA: Diagnosis not present

## 2022-06-28 DIAGNOSIS — I739 Peripheral vascular disease, unspecified: Secondary | ICD-10-CM | POA: Diagnosis not present

## 2022-06-28 DIAGNOSIS — E291 Testicular hypofunction: Secondary | ICD-10-CM | POA: Diagnosis not present

## 2022-06-28 DIAGNOSIS — R269 Unspecified abnormalities of gait and mobility: Secondary | ICD-10-CM | POA: Diagnosis not present

## 2022-06-28 DIAGNOSIS — I10 Essential (primary) hypertension: Secondary | ICD-10-CM | POA: Diagnosis not present

## 2022-06-28 DIAGNOSIS — D519 Vitamin B12 deficiency anemia, unspecified: Secondary | ICD-10-CM | POA: Diagnosis not present

## 2022-06-28 DIAGNOSIS — G609 Hereditary and idiopathic neuropathy, unspecified: Secondary | ICD-10-CM | POA: Diagnosis not present

## 2022-07-06 ENCOUNTER — Other Ambulatory Visit: Payer: Self-pay

## 2022-07-06 ENCOUNTER — Other Ambulatory Visit: Payer: Self-pay | Admitting: Sports Medicine

## 2022-07-06 MED ORDER — MELOXICAM 15 MG PO TABS: 15.0000 mg | ORAL_TABLET | Freq: Every day | ORAL | 2 refills | Status: DC | PRN

## 2022-07-06 NOTE — Progress Notes (Signed)
Meloxicam refill requested by patient

## 2022-08-02 ENCOUNTER — Other Ambulatory Visit: Payer: Self-pay | Admitting: Orthopedic Surgery

## 2022-08-03 NOTE — Care Plan (Signed)
Ortho Bundle Case Management Note  Patient Details  Name: Albert Barr MRN: [id number] Date of Birth: Jun 25, 1941  Spoke with patient prior to surgery. He will discharge to home with family to assist. No DME or Therapy needs at this time. Patient and MD in agreement with plan. No other needs                     DME Arranged:    DME Agency:     HH Arranged:    HH Agency:     Additional Comments: Please contact me with any questions of if this plan should need to change.  Ladell Heads,  [facility]  [phone] 08/03/2022, 8:07 AM

## 2022-08-10 NOTE — Progress Notes (Addendum)
COVID Vaccine received:  []$  No [x]$  Yes Date of any COVID positive Test in last 90 days:  PCP - Reynold Bowen MD  Cardiologist - None, Ena Dawley, MD  Cassell Clement 11-13-2015)  Neurologist- Clayborne Dana, MD at [facility]  [phone] (Work)  [phone] (Fax)   Chest x-ray - 05-28-2020   2V   CE   EKG - 11-13-2015 epic  will repeat at PST  Stress Test -  ECHO - 07-02-2020   CE (done at [facility]) Cardiac Cath -  PCR screen: [x]$  Ordered & Completed                      []$   No Order but Needs PROFEND                      []$   N/A for this surgery  Surgery Plan:  [x]$  Ambulatory                            []$  Outpatient in bed                            []$  Admit  Anesthesia:    [x]$  General  []$  Spinal                           []$   Choice []$   MAC  Pacemaker / ICD device [x]$  No []$  Yes        Device order form faxed [x]$  No    []$   Yes      Faxed to:  Spinal Cord Stimulator:[x]$  No []$  Yes      (Remind patient to bring remote DOS) Other Implants:   History of Sleep Apnea? []$  No [x]$  Yes   CPAP used?- []$  No [x]$  Yes    Does the patient monitor blood sugar? []$  No []$  Yes  []$  N/A  Patient has: []$  Pre-DM   []$  DM1  []$   DM2 Does patient have a [employer] or Dexacom? []$  No []$  Yes   Fasting Blood Sugar Ranges-  Checks Blood Sugar _____ times a day  Last dose of GLP1 agonist-  WEGOVY  no DM, takes for weight loss.  GLP1 instructions:   Blood Thinner / Instructions:None Aspirin Instructions:  ASA 3m  ERAS Protocol Ordered: []$  No  [x]$  Yes PRE-SURGERY [x]$  ENSURE  []$  G2  Patient is to be NPO after: 04:30  Comments: The patient was given Benzoyl peroxide Gel as ordered. Instruction regarding application starting 2 days prior to surgery was given and patient voiced understanding.   Activity level: Patient can / can not climb a flight of stairs without difficulty; []$  No CP  []$  No SOB, but would have ______   Patient can / can not perform ADLs without assistance.   Anesthesia  review: OSA-CPAP, HTN, asthma, GERD, Has bilateral foot drop- wears AFOs, peripheral neuropathy.   Patient denies shortness of breath, fever, cough and chest pain at PAT appointment.  Patient verbalized understanding and agreement to the Pre-Surgical Instructions that were given to them at this PAT appointment. Patient was also educated of the need to review these PAT instructions again prior to his/her surgery.I reviewed the appropriate phone numbers to call if they have any and questions or concerns.

## 2022-08-10 NOTE — Patient Instructions (Addendum)
SURGICAL WAITING ROOM VISITATION Patients having surgery or a procedure may have no more than 2 support people in the waiting area - these visitors may rotate in the visitor waiting room.   Due to an increase in RSV and influenza rates and associated hospitalizations, children ages 85 and under may not visit patients in [place]. If the patient needs to stay at the hospital during part of their recovery, the visitor guidelines for inpatient rooms apply.  PRE-OP VISITATION  Pre-op nurse will coordinate an appropriate time for 1 support person to accompany the patient in pre-op.  This support person may not rotate.  This visitor will be contacted when the time is appropriate for the visitor to come back in the pre-op area.  Please refer to the [facility] website for the visitor guidelines for Inpatients (after your surgery is over and you are in a regular room).  You are not required to quarantine at this time prior to your surgery. However, you must do this: Hand Hygiene often Do NOT share personal items Notify your provider if you are in close contact with someone who has COVID or you develop fever 100.4 or greater, new onset of sneezing, cough, sore throat, shortness of breath or body aches.  If you test positive for Covid or have been in contact with anyone that has tested positive in the last 10 days please notify you surgeon.    Your procedure is scheduled on:  Thursday  August 18, 2022  Report to [facility] Main Entrance: [place] entrance where the [employer] is available.   Report to admitting at:  05:15   AM  +++++Call this number if you have any questions or problems the morning of surgery [phone]  Do not eat food after Midnight the night prior to your surgery/procedure.  After Midnight you may have the following liquids until   04:30 AM DAY OF SURGERY  Clear Liquid Diet Water Black Coffee (sugar ok, NO MILK/CREAM OR CREAMERS)  Tea (sugar ok,  NO MILK/CREAM OR CREAMERS) regular and decaf                             Plain Jell-O  with no fruit (NO RED)                                           Fruit ices (not with fruit pulp, NO RED)                                     Popsicles (NO RED)                                                                  Juice: apple, WHITE grape, WHITE cranberry Sports drinks like Gatorade or Powerade (NO RED)                   The day of surgery:  Drink ONE (1) Pre-Surgery Clear Ensure  at  04:30  AM the morning of surgery. Drink in  one sitting. Do not sip.  This drink was given to you during your hospital pre-op appointment visit. Nothing else to drink after completing the Pre-Surgery Clear Ensure : No candy, chewing gum or throat lozenges.    FOLLOW ANY ADDITIONAL PRE OP INSTRUCTIONS YOU RECEIVED FROM YOUR SURGEON'S OFFICE!!!   Oral Hygiene is also important to reduce your risk of infection.        Remember - BRUSH YOUR TEETH THE MORNING OF SURGERY WITH YOUR REGULAR TOOTHPASTE  WEGOVY- Do not take on Wednesday 08-10-2022, you will resume the injections on the Wednesday following your surgery.   Take ONLY these medicines the morning of surgery with A SIP OF WATER: Zafirlukast (Accolate), gabapentin, omeprazole, Mirabegron (Myrbetriq). You may use your Inhalers, and both Eye drops if needed.    If You have been diagnosed with Sleep Apnea - Bring CPAP mask and tubing day of surgery. We will provide you with a CPAP machine on the day of your surgery.                   You may not have any metal on your body including jewelry, and body piercing  Do not wear  lotions, powders,  cologne, or deodorant  Men may shave face and neck.  Contacts, Hearing Aids, dentures or bridgework may not be worn into surgery. DENTURES WILL BE REMOVED PRIOR TO SURGERY PLEASE DO NOT APPLY "Poly grip" OR ADHESIVES!!!  Patients discharged on the day of surgery will not be allowed to drive home.  Someone NEEDS to stay  with you for the first 24 hours after anesthesia.  Do not bring your home medications to the hospital EXCEPT YOUR ALBUTEROL INHALER. The Pharmacy will dispense medications listed on your medication list to you during your admission in the Hospital.  Special Instructions: Bring a copy of your healthcare power of attorney and living will documents the day of surgery, if you wish to have them scanned into your [facility] Records- EPIC  Please read over the following fact sheets you were given: IF YOU HAVE QUESTIONS ABOUT YOUR PRE-OP INSTRUCTIONS, PLEASE CALL [phone]  ([place])   [place] - Preparing for Surgery Before surgery, you can play an important role.  Because skin is not sterile, your skin needs to be as free of germs as possible.  You can reduce the number of germs on your skin by washing with CHG (chlorahexidine gluconate) soap before surgery.  CHG is an antiseptic cleaner which kills germs and bonds with the skin to continue killing germs even after washing. Please DO NOT use if you have an allergy to CHG or antibacterial soaps.  If your skin becomes reddened/irritated stop using the CHG and inform your nurse when you arrive at Short Stay. Do not shave (including legs and underarms) for at least 48 hours prior to the first CHG shower.  You may shave your face/neck.  Please follow these instructions carefully:  1.  Shower with CHG Soap the night before surgery and the  morning of surgery.  2.  If you choose to wash your hair, wash your hair first as usual with your normal  shampoo.  3.  After you shampoo, rinse your hair and body thoroughly to remove the shampoo.                             4.  Use CHG as you would any other liquid soap.  You can  apply chg directly to the skin and wash.  Gently with a scrungie or clean washcloth.  5.  Apply the CHG Soap to your body ONLY FROM THE NECK DOWN.   Do not use on face/ open                           Wound or open sores. Avoid contact  with eyes, ears mouth and genitals (private parts).                       Wash face,  Genitals (private parts) with your normal soap.             6.  Wash thoroughly, paying special attention to the area where your  surgery  will be performed.  7.  Thoroughly rinse your body with warm water from the neck down.  8.  DO NOT shower/wash with your normal soap after using and rinsing off the CHG Soap.            9.  Pat yourself dry with a clean towel.            10.  Wear clean pajamas.            11.  Place clean sheets on your bed the night of your first shower and do not  sleep with pets.  ON THE DAY OF SURGERY : Do not apply any lotions/deodorants the morning of surgery.  Please wear clean clothes to the hospital/surgery center.   Preparing for Total Shoulder Arthroplasty ================================================================= Please follow these instructions carefully, in addition to any other special Bathing information that was explained to you at the Presurgical Appointment:  BENZOYL PEROXIDE 5% GEL: Used to kill bacteria on the skin which could cause an infection at the surgery site.   Please do not use if you have an allergy to benzoyl peroxide. If your skin becomes reddened/irritated stop using the benzoyl peroxide and inform your Doctor.   Starting two days before surgery, apply as follows:  1. Apply benzoyl peroxide gel in the morning and at night. Apply after taking a shower. If you are not taking a shower, clean entire shoulder front, back, and side, along with the armpit with a clean wet washcloth.  2. Place a quarter-sized dollop of the gel on your SHOULDER and rub in thoroughly, making sure to cover the front, back, and side of your shoulder, along with the armpit.   2 Days prior to Surgery      Tuesday [id number] First Application _______ Morning Second Application _______ Night  Day Before Surgery       Wednesday  123456 First Application______  Morning  On the night before surgery, wash your entire body (except hair, face and private areas) with CHG Soap. THEN, rub in the LAST application of the Benzoyl Peroxide Gel on your shoulder.   3. On the Morning of Surgery wash your BODY AGAIN with CHG Soap (except hair, face and private areas)  4. DO NOT USE THE BENZOYL PEROXIDE GEL ON THE DAY OF YOUR SURGERY      FAILURE TO FOLLOW THESE INSTRUCTIONS MAY RESULT IN THE CANCELLATION OF YOUR SURGERY  PATIENT SIGNATURE_________________________________  NURSE SIGNATURE__________________________________  ________________________________________________________________________        Adam Phenix    An incentive spirometer is a tool that can help keep your lungs clear and active. This tool measures how well you are filling your lungs with  each breath. Taking long deep breaths may help reverse or decrease the chance of developing breathing (pulmonary) problems (especially infection) following: A long period of time when you are unable to move or be active. BEFORE THE PROCEDURE  If the spirometer includes an indicator to show your best effort, your nurse or respiratory therapist will set it to a desired goal. If possible, sit up straight or lean slightly forward. Try not to slouch. Hold the incentive spirometer in an upright position. INSTRUCTIONS FOR USE  Sit on the edge of your bed if possible, or sit up as far as you can in bed or on a chair. Hold the incentive spirometer in an upright position. Breathe out normally. Place the mouthpiece in your mouth and seal your lips tightly around it. Breathe in slowly and as deeply as possible, raising the piston or the ball toward the top of the column. Hold your breath for 3-5 seconds or for as long as possible. Allow the piston or ball to fall to the bottom of the column. Remove the mouthpiece from your mouth and breathe out normally. Rest for a few seconds and repeat Steps 1  through 7 at least 10 times every 1-2 hours when you are awake. Take your time and take a few normal breaths between deep breaths. The spirometer may include an indicator to show your best effort. Use the indicator as a goal to work toward during each repetition. After each set of 10 deep breaths, practice coughing to be sure your lungs are clear. If you have an incision (the cut made at the time of surgery), support your incision when coughing by placing a pillow or rolled up towels firmly against it. Once you are able to get out of bed, walk around indoors and cough well. You may stop using the incentive spirometer when instructed by your caregiver.  RISKS AND COMPLICATIONS Take your time so you do not get dizzy or light-headed. If you are in pain, you may need to take or ask for pain medication before doing incentive spirometry. It is harder to take a deep breath if you are having pain. AFTER USE Rest and breathe slowly and easily. It can be helpful to keep track of a log of your progress. Your caregiver can provide you with a simple table to help with this. If you are using the spirometer at home, follow these instructions: [place] IF:  You are having difficultly using the spirometer. You have trouble using the spirometer as often as instructed. Your pain medication is not giving enough relief while using the spirometer. You develop fever of 100.5 F (38.1 C) or higher.                                                                                                    SEEK IMMEDIATE MEDICAL CARE IF:  You cough up bloody sputum that had not been present before. You develop fever of 102 F (38.9 C) or greater. You develop worsening pain at or near the incision site. MAKE SURE YOU:  Understand these instructions. Will watch  your condition. Will get help right away if you are not doing well or get worse. Document Released: 10/24/2006 Document Revised: 09/05/2011 Document Reviewed:  12/25/2006 [facility] Patient Information 2014 [place], [place].

## 2022-08-11 ENCOUNTER — Encounter (HOSPITAL_COMMUNITY): Payer: Self-pay

## 2022-08-11 ENCOUNTER — Encounter (HOSPITAL_COMMUNITY)
Admission: RE | Admit: 2022-08-11 | Discharge: 2022-08-11 | Disposition: A | Discharge status: Home / Self Care | Payer: Medicare PPO | Source: Ambulatory Visit | Attending: Orthopedic Surgery | Admitting: Orthopedic Surgery

## 2022-08-11 ENCOUNTER — Ambulatory Visit (HOSPITAL_COMMUNITY)
Admission: RE | Admit: 2022-08-11 | Discharge: 2022-08-11 | Disposition: A | Discharge status: Home / Self Care | Payer: Medicare PPO | Source: Ambulatory Visit | Attending: Orthopedic Surgery | Admitting: Orthopedic Surgery

## 2022-08-11 ENCOUNTER — Other Ambulatory Visit: Payer: Self-pay

## 2022-08-11 VITALS — BP 121/83 | HR 79 | Temp 98.4°F | Resp 16 | Ht 68.5 in | Wt 237.0 lb

## 2022-08-11 DIAGNOSIS — Z01818 Encounter for other preprocedural examination: Secondary | ICD-10-CM

## 2022-08-11 DIAGNOSIS — I10 Essential (primary) hypertension: Secondary | ICD-10-CM

## 2022-08-11 LAB — CBC
HCT: 41.6 % (ref 39.0–52.0)
Hemoglobin: 14 g/dL (ref 13.0–17.0)
MCH: 32.5 pg (ref 26.0–34.0)
MCHC: 33.7 g/dL (ref 30.0–36.0)
MCV: 96.5 fL (ref 80.0–100.0)
Platelets: 152 10*3/uL (ref 150–400)
RBC: 4.31 MIL/uL (ref 4.22–5.81)
RDW: 12.8 % (ref 11.5–15.5)
WBC: 6.5 10*3/uL (ref 4.0–10.5)
nRBC: 0 % (ref 0.0–0.2)

## 2022-08-11 LAB — BASIC METABOLIC PANEL
Anion gap: 12 (ref 5–15)
BUN: 7 mg/dL — ABNORMAL LOW (ref 8–23)
CO2: 28 mmol/L (ref 22–32)
Calcium: 9.2 mg/dL (ref 8.9–10.3)
Chloride: 102 mmol/L (ref 98–111)
Creatinine, Ser: 0.75 mg/dL (ref 0.61–1.24)
GFR, Estimated: >60 mL/min (ref >60)
Glucose, Bld: 98 mg/dL (ref 70–99)
Potassium: 3.6 mmol/L (ref 3.5–5.1)
Sodium: 142 mmol/L (ref 135–145)

## 2022-08-11 LAB — SURGICAL PCR SCREEN
MRSA, PCR: NEGATIVE
Staphylococcus aureus: NEGATIVE

## 2022-08-17 NOTE — Anesthesia Preprocedure Evaluation (Signed)
Anesthesia Evaluation  Patient identified by MRN, date of birth, ID band Patient awake    Reviewed: Allergy & Precautions, NPO status , Patient's Chart, lab work & pertinent test results  Airway Mallampati: II  TM Distance: >3 FB Neck ROM: Full    Dental no notable dental hx. (+) Implants, Dental Advisory Given   Pulmonary asthma , sleep apnea , former smoker   Pulmonary exam normal breath sounds clear to auscultation       Cardiovascular hypertension, Normal cardiovascular exam Rhythm:Regular Rate:Normal     Neuro/Psych    GI/Hepatic hiatal hernia,GERD  ,,  Endo/Other    Renal/GU Lab Results      Component                Value               Date                      CREATININE               0.75                08/11/2022                BUN                      7 (L)               08/11/2022                NA                       142                 08/11/2022                K                        3.6                 08/11/2022                     Musculoskeletal  (+) Arthritis ,    Abdominal  (+) + obese  Peds  Hematology Lab Results      Component                Value               Date                              HGB                      14.0                08/11/2022                HCT                      41.6                08/11/2022                      PLT  152                 08/11/2022              Anesthesia Other Findings All: moxifloxacin, beclamethasone  Reproductive/Obstetrics                             Anesthesia Physical Anesthesia Plan  ASA: 3  Anesthesia Plan: Regional and General   Post-op Pain Management: Regional block* and Minimal or no pain anticipated   Induction: Intravenous  PONV Risk Score and Plan: Treatment may vary due to age or medical condition and Ondansetron  Airway Management Planned: Oral ETT  Additional Equipment:  None  Intra-op Plan:   Post-operative Plan: Extubation in OR  Informed Consent:      Dental advisory given  Plan Discussed with: CRNA and Surgeon  Anesthesia Plan Comments: (GA w R ISB)        Anesthesia Quick Evaluation

## 2022-08-18 ENCOUNTER — Encounter (HOSPITAL_COMMUNITY)
Admission: RE | Disposition: A | Discharge status: Home / Self Care | Payer: Self-pay | Source: Ambulatory Visit | Attending: Orthopedic Surgery

## 2022-08-18 ENCOUNTER — Encounter (HOSPITAL_COMMUNITY): Payer: Self-pay | Admitting: Orthopedic Surgery

## 2022-08-18 ENCOUNTER — Other Ambulatory Visit: Payer: Self-pay

## 2022-08-18 ENCOUNTER — Ambulatory Visit (HOSPITAL_COMMUNITY)
Admission: RE | Admit: 2022-08-18 | Discharge: 2022-08-18 | Disposition: A | Discharge status: Home / Self Care | Payer: Medicare PPO | Source: Ambulatory Visit | Attending: Orthopedic Surgery | Admitting: Orthopedic Surgery

## 2022-08-18 ENCOUNTER — Ambulatory Visit (HOSPITAL_BASED_OUTPATIENT_CLINIC_OR_DEPARTMENT_OTHER): Payer: Medicare PPO | Admitting: Anesthesiology

## 2022-08-18 ENCOUNTER — Ambulatory Visit (HOSPITAL_COMMUNITY): Payer: Medicare PPO | Admitting: Physician Assistant

## 2022-08-18 DIAGNOSIS — K219 Gastro-esophageal reflux disease without esophagitis: Secondary | ICD-10-CM | POA: Insufficient documentation

## 2022-08-18 DIAGNOSIS — Z87891 Personal history of nicotine dependence: Secondary | ICD-10-CM | POA: Insufficient documentation

## 2022-08-18 DIAGNOSIS — G4733 Obstructive sleep apnea (adult) (pediatric): Secondary | ICD-10-CM | POA: Insufficient documentation

## 2022-08-18 DIAGNOSIS — J45909 Unspecified asthma, uncomplicated: Secondary | ICD-10-CM | POA: Diagnosis not present

## 2022-08-18 DIAGNOSIS — M6289 Other specified disorders of muscle: Secondary | ICD-10-CM | POA: Diagnosis not present

## 2022-08-18 DIAGNOSIS — I10 Essential (primary) hypertension: Secondary | ICD-10-CM | POA: Diagnosis not present

## 2022-08-18 DIAGNOSIS — Z96611 Presence of right artificial shoulder joint: Secondary | ICD-10-CM | POA: Diagnosis not present

## 2022-08-18 DIAGNOSIS — Z09 Encounter for follow-up examination after completed treatment for conditions other than malignant neoplasm: Secondary | ICD-10-CM | POA: Diagnosis not present

## 2022-08-18 DIAGNOSIS — M19011 Primary osteoarthritis, right shoulder: Secondary | ICD-10-CM

## 2022-08-18 DIAGNOSIS — G8918 Other acute postprocedural pain: Secondary | ICD-10-CM | POA: Diagnosis not present

## 2022-08-18 HISTORY — PX: REVERSE SHOULDER ARTHROPLASTY: SHX5054

## 2022-08-18 SURGERY — ARTHROPLASTY, SHOULDER, TOTAL, REVERSE
Anesthesia: Regional | Site: Shoulder | Laterality: Right

## 2022-08-18 MED ORDER — BUPIVACAINE HCL (PF) 0.5 % IJ SOLN
INTRAMUSCULAR | Status: DC | PRN
  Administered 2022-08-18: 14 mL via PERINEURAL

## 2022-08-18 MED ORDER — ACETAMINOPHEN 10 MG/ML IV SOLN: 1000.0000 mg | Freq: Once | INTRAVENOUS | Status: DC | PRN

## 2022-08-18 MED ORDER — LIDOCAINE HCL (CARDIAC) PF 100 MG/5ML IV SOSY
PREFILLED_SYRINGE | INTRAVENOUS | Status: DC | PRN
  Administered 2022-08-18: 50 mg via INTRAVENOUS

## 2022-08-18 MED ORDER — ONDANSETRON HCL 4 MG/2ML IJ SOLN
INTRAMUSCULAR | Status: AC
  Filled 2022-08-18: qty 2

## 2022-08-18 MED ORDER — BUPIVACAINE LIPOSOME 1.3 % IJ SUSP
INTRAMUSCULAR | Status: DC | PRN
  Administered 2022-08-18: 10 mL via PERINEURAL

## 2022-08-18 MED ORDER — PHENYLEPHRINE HCL-NACL 20-0.9 MG/250ML-% IV SOLN
INTRAVENOUS | Status: AC
  Filled 2022-08-18: qty 250

## 2022-08-18 MED ORDER — OXYCODONE HCL 5 MG PO TABS: 5.0000 mg | ORAL_TABLET | ORAL | 0 refills | Status: DC | PRN

## 2022-08-18 MED ORDER — ORAL CARE MOUTH RINSE: 15.0000 mL | Freq: Once | OROMUCOSAL | Status: AC

## 2022-08-18 MED ORDER — DEXAMETHASONE SODIUM PHOSPHATE 10 MG/ML IJ SOLN
INTRAMUSCULAR | Status: DC | PRN
  Administered 2022-08-18: 8 mg via INTRAVENOUS

## 2022-08-18 MED ORDER — SUGAMMADEX SODIUM 200 MG/2ML IV SOLN
INTRAVENOUS | Status: DC | PRN
  Administered 2022-08-18: 200 mg via INTRAVENOUS

## 2022-08-18 MED ORDER — PROPOFOL 10 MG/ML IV BOLUS
INTRAVENOUS | Status: DC | PRN
  Administered 2022-08-18: 130 mg via INTRAVENOUS

## 2022-08-18 MED ORDER — ROCURONIUM BROMIDE 100 MG/10ML IV SOLN
INTRAVENOUS | Status: DC | PRN
  Administered 2022-08-18: 60 mg via INTRAVENOUS
  Administered 2022-08-18: 10 mg via INTRAVENOUS

## 2022-08-18 MED ORDER — CEFAZOLIN SODIUM-DEXTROSE 2-4 GM/100ML-% IV SOLN
2.0000 g | INTRAVENOUS | Status: AC
  Administered 2022-08-18: 2 g via INTRAVENOUS
  Filled 2022-08-18: qty 100

## 2022-08-18 MED ORDER — LIDOCAINE HCL (PF) 2 % IJ SOLN
INTRAMUSCULAR | Status: AC
  Filled 2022-08-18: qty 5

## 2022-08-18 MED ORDER — WATER FOR IRRIGATION, STERILE IR SOLN
Status: DC | PRN
  Administered 2022-08-18: 2000 mL

## 2022-08-18 MED ORDER — CHLORHEXIDINE GLUCONATE 0.12 % MT SOLN
15.0000 mL | Freq: Once | OROMUCOSAL | Status: AC
  Administered 2022-08-18: 15 mL via OROMUCOSAL

## 2022-08-18 MED ORDER — TIZANIDINE HCL 2 MG PO TABS: 2.0000 mg | ORAL_TABLET | Freq: Three times a day (TID) | ORAL | 0 refills | Status: DC | PRN

## 2022-08-18 MED ORDER — TRANEXAMIC ACID-NACL 1000-0.7 MG/100ML-% IV SOLN
1000.0000 mg | INTRAVENOUS | Status: AC
  Administered 2022-08-18: 1000 mg via INTRAVENOUS
  Filled 2022-08-18: qty 100

## 2022-08-18 MED ORDER — LACTATED RINGERS IV SOLN: INTRAVENOUS | Status: DC

## 2022-08-18 MED ORDER — FENTANYL CITRATE PF 50 MCG/ML IJ SOSY
PREFILLED_SYRINGE | INTRAMUSCULAR | Status: AC
  Filled 2022-08-18: qty 1

## 2022-08-18 MED ORDER — FENTANYL CITRATE (PF) 100 MCG/2ML IJ SOLN
INTRAMUSCULAR | Status: AC
  Filled 2022-08-18: qty 2

## 2022-08-18 MED ORDER — DEXAMETHASONE SODIUM PHOSPHATE 10 MG/ML IJ SOLN
INTRAMUSCULAR | Status: AC
  Filled 2022-08-18: qty 1

## 2022-08-18 MED ORDER — PHENYLEPHRINE HCL-NACL 20-0.9 MG/250ML-% IV SOLN
INTRAVENOUS | Status: DC | PRN
  Administered 2022-08-18: 30 ug/min via INTRAVENOUS

## 2022-08-18 MED ORDER — FENTANYL CITRATE PF 50 MCG/ML IJ SOSY
25.0000 ug | PREFILLED_SYRINGE | INTRAMUSCULAR | Status: DC | PRN
  Administered 2022-08-18 (×2): 25 ug via INTRAVENOUS

## 2022-08-18 MED ORDER — PROPOFOL 10 MG/ML IV BOLUS
INTRAVENOUS | Status: AC
  Filled 2022-08-18: qty 20

## 2022-08-18 MED ORDER — SODIUM CHLORIDE 0.9 % IR SOLN
Status: DC | PRN
  Administered 2022-08-18: 1000 mL

## 2022-08-18 MED ORDER — ONDANSETRON HCL 4 MG/2ML IJ SOLN
INTRAMUSCULAR | Status: DC | PRN
  Administered 2022-08-18: 4 mg via INTRAVENOUS

## 2022-08-18 MED ORDER — ROCURONIUM BROMIDE 10 MG/ML (PF) SYRINGE
PREFILLED_SYRINGE | INTRAVENOUS | Status: AC
  Filled 2022-08-18: qty 10

## 2022-08-18 MED ORDER — FENTANYL CITRATE (PF) 100 MCG/2ML IJ SOLN
INTRAMUSCULAR | Status: DC | PRN
  Administered 2022-08-18: 50 ug via INTRAVENOUS
  Administered 2022-08-18 (×2): 25 ug via INTRAVENOUS

## 2022-08-18 MED ORDER — ONDANSETRON HCL 4 MG/2ML IJ SOLN: 4.0000 mg | Freq: Once | INTRAMUSCULAR | Status: DC | PRN

## 2022-08-18 MED ORDER — 0.9 % SODIUM CHLORIDE (POUR BTL) OPTIME
TOPICAL | Status: DC | PRN
  Administered 2022-08-18: 1000 mL

## 2022-08-18 SURGICAL SUPPLY — 81 items
AID PSTN UNV HD RSTRNT DISP (MISCELLANEOUS) ×1
BAG COUNTER SPONGE SURGICOUNT (BAG) IMPLANT
BAG SPEC THK2 15X12 ZIP CLS (MISCELLANEOUS) ×1
BAG SPNG CNTER NS LX DISP (BAG) ×1
BAG ZIPLOCK 12X15 (MISCELLANEOUS) ×1 IMPLANT
BASEPLATE P2 COATD GLND 6.5X30 (Shoulder) IMPLANT
BIT DRILL 1.6MX128 (BIT) IMPLANT
BIT DRILL 2.5 DIA 127 CALI (BIT) IMPLANT
BIT DRILL 4 DIA CALIBRATED (BIT) IMPLANT
BLADE SAW SGTL 73X25 THK (BLADE) ×1 IMPLANT
BOOTIES KNEE HIGH SLOAN (MISCELLANEOUS) ×2 IMPLANT
BSPLAT GLND 30 STRL LF SHLDR (Shoulder) ×1 IMPLANT
CLSR STERI-STRIP ANTIMIC 1/2X4 (GAUZE/BANDAGES/DRESSINGS) IMPLANT
COOLER ICEMAN CLASSIC (MISCELLANEOUS) IMPLANT
COVER BACK TABLE 60X90IN (DRAPES) ×1 IMPLANT
COVER SURGICAL LIGHT HANDLE (MISCELLANEOUS) ×1 IMPLANT
DRAPE INCISE IOBAN 66X45 STRL (DRAPES) ×1 IMPLANT
DRAPE ORTHO SPLIT 77X108 STRL (DRAPES) ×2
DRAPE POUCH INSTRU U-SHP 10X18 (DRAPES) ×1 IMPLANT
DRAPE SHEET LG 3/4 BI-LAMINATE (DRAPES) ×1 IMPLANT
DRAPE SURG 17X11 SM STRL (DRAPES) ×1 IMPLANT
DRAPE SURG ORHT 6 SPLT 77X108 (DRAPES) ×2 IMPLANT
DRAPE TOP 10253 STERILE (DRAPES) ×1 IMPLANT
DRAPE U-SHAPE 47X51 STRL (DRAPES) ×1 IMPLANT
DRSG AQUACEL AG ADV 3.5X 6 (GAUZE/BANDAGES/DRESSINGS) ×1 IMPLANT
DRSG AQUACEL AG ADV 3.5X10 (GAUZE/BANDAGES/DRESSINGS) IMPLANT
DURAPREP 26ML APPLICATOR (WOUND CARE) ×2 IMPLANT
ELECT BLADE TIP CTD 4 INCH (ELECTRODE) ×1 IMPLANT
ELECT REM PT RETURN 15FT ADLT (MISCELLANEOUS) ×1 IMPLANT
FACESHIELD WRAPAROUND (MASK) ×1 IMPLANT
FACESHIELD WRAPAROUND OR TEAM (MASK) ×1 IMPLANT
GLOVE BIO SURGEON STRL SZ7.5 (GLOVE) ×1 IMPLANT
GLOVE BIOGEL PI IND STRL 6.5 (GLOVE) ×1 IMPLANT
GLOVE BIOGEL PI IND STRL 8 (GLOVE) ×1 IMPLANT
GLOVE SURG SS PI 6.5 STRL IVOR (GLOVE) ×1 IMPLANT
GOWN STRL REUS W/ TWL LRG LVL3 (GOWN DISPOSABLE) ×1 IMPLANT
GOWN STRL REUS W/ TWL XL LVL3 (GOWN DISPOSABLE) ×1 IMPLANT
GOWN STRL REUS W/TWL LRG LVL3 (GOWN DISPOSABLE) ×1
GOWN STRL REUS W/TWL XL LVL3 (GOWN DISPOSABLE) ×1
HANDPIECE INTERPULSE COAX TIP (DISPOSABLE) ×1
HOOD PEEL AWAY T7 (MISCELLANEOUS) ×3 IMPLANT
INSERT EPOLY STND HUMERUS+4 32 (Shoulder) ×1 IMPLANT
INSERT EPOLYSTD HUMERUS+4 32 (Shoulder) IMPLANT
KIT BASIN OR (CUSTOM PROCEDURE TRAY) ×1 IMPLANT
KIT TURNOVER KIT A (KITS) IMPLANT
MANIFOLD NEPTUNE II (INSTRUMENTS) ×1 IMPLANT
NDL TROCAR POINT SZ 2 1/2 (NEEDLE) IMPLANT
NEEDLE TROCAR POINT SZ 2 1/2 (NEEDLE) IMPLANT
NS IRRIG 1000ML POUR BTL (IV SOLUTION) ×1 IMPLANT
P2 COATDE GLNOID BSEPLT 6.5X30 (Shoulder) ×1 IMPLANT
PACK SHOULDER (CUSTOM PROCEDURE TRAY) ×1 IMPLANT
PAD COLD SHLDR WRAP-ON (PAD) IMPLANT
PROTECTOR NERVE ULNAR (MISCELLANEOUS) IMPLANT
RESTRAINT HEAD UNIVERSAL NS (MISCELLANEOUS) ×1 IMPLANT
RETRIEVER SUT HEWSON (MISCELLANEOUS) IMPLANT
SCREW BONE LOCKING RSP 5.0X30 (Screw) ×1 IMPLANT
SCREW BONE LOCKING RSP 5.0X34 (Screw) ×1 IMPLANT
SCREW BONE RSP LOCK 5X22 (Screw) IMPLANT
SCREW BONE RSP LOCK 5X30 (Screw) IMPLANT
SCREW BONE RSP LOCK 5X34 (Screw) IMPLANT
SCREW BONE RSP LOCKING 5.0X32 (Screw) ×1 IMPLANT
SCREW RETAIN W/HEAD 32MM (Shoulder) IMPLANT
SET HNDPC FAN SPRY TIP SCT (DISPOSABLE) ×1 IMPLANT
SLING ARM IMMOBILIZER LRG (SOFTGOODS) IMPLANT
SLING ARM IMMOBILIZER MED (SOFTGOODS) IMPLANT
STEM HUMERAL STD SHELL 14X48 (Miscellaneous) IMPLANT
STRIP CLOSURE SKIN 1/2X4 (GAUZE/BANDAGES/DRESSINGS) ×2 IMPLANT
SUCTION FRAZIER HANDLE 10FR (MISCELLANEOUS)
SUCTION TUBE FRAZIER 10FR DISP (MISCELLANEOUS) IMPLANT
SUPPORT WRAP ARM LG (MISCELLANEOUS) ×1 IMPLANT
SUT ETHIBOND 2 V 37 (SUTURE) IMPLANT
SUT FIBERWIRE #2 38 REV NDL BL (SUTURE) ×1
SUT MNCRL AB 4-0 PS2 18 (SUTURE) ×1 IMPLANT
SUT VIC AB 2-0 CT1 27 (SUTURE) ×2
SUT VIC AB 2-0 CT1 TAPERPNT 27 (SUTURE) ×2 IMPLANT
SUTURE FIBERWR#2 38 REV NDL BL (SUTURE) IMPLANT
TAPE LABRALWHITE 1.5X36 (TAPE) IMPLANT
TAPE SUT LABRALTAP WHT/BLK (SUTURE) IMPLANT
TOWEL OR 17X26 10 PK STRL BLUE (TOWEL DISPOSABLE) ×1 IMPLANT
TOWEL OR NON WOVEN STRL DISP B (DISPOSABLE) ×1 IMPLANT
WATER STERILE IRR 1000ML POUR (IV SOLUTION) ×1 IMPLANT

## 2022-08-18 NOTE — Anesthesia Procedure Notes (Signed)
Procedure Name: Intubation Date/Time: 08/18/2022 7:48 AM  Performed by: Garrel Ridgel, CRNAPre-anesthesia Checklist: Patient identified, Emergency Drugs available, Suction available and Patient being monitored Patient Re-evaluated:Patient Re-evaluated prior to induction Oxygen Delivery Method: Circle system utilized Preoxygenation: Pre-oxygenation with 100% oxygen Induction Type: IV induction Ventilation: Mask ventilation without difficulty Laryngoscope Size: Glidescope and 4 Grade View: Grade II Tube type: Oral Tube size: 7.5 mm Number of attempts: 1 Airway Equipment and Method: Rigid stylet and Video-laryngoscopy Placement Confirmation: ETT inserted through vocal cords under direct vision, positive ETCO2 and breath sounds checked- equal and bilateral Secured at: 23 cm Tube secured with: Tape Dental Injury: Teeth and Oropharynx as per pre-operative assessment  Difficulty Due To: Difficulty was anticipated, Difficult Airway- due to reduced neck mobility, Difficult Airway- due to anterior larynx and Difficult Airway- due to dentition

## 2022-08-18 NOTE — Op Note (Signed)
Procedure(s): REVERSE SHOULDER ARTHROPLASTY Procedure Note  Albert Barr male 82 y.o. 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 male  With endstage right shoulder arthritis with associated rotator cuff disease. Pain and dysfunction interfered with quality of life and nonoperative treatment with activity modification, NSAIDS and injections failed.     Surgeon: Rhae Hammock   Assistants: Sheryle Hail PA-C Amber was present and scrubbed throughout the procedure and was essential in positioning, retraction, exposure, and closure)  Anesthesia: General endotracheal anesthesia with preoperative interscalene block given by the attending anesthesiologist   Procedure Detail  REVERSE SHOULDER ARTHROPLASTY   Estimated Blood Loss:  200 mL         Drains: none  Blood Given: none          Specimens: none        Complications:  (1) Difficult to intubate - expected  Comments: Filed from anesthesia note documentation.         Disposition: PACU - hemodynamically stable.         Condition: stable      OPERATIVE FINDINGS:  A DJO Altivate pressfit reverse total shoulder arthroplasty was placed with a  size 14 stem, a 32 standard glenosphere, and a +4-mm poly insert. The base plate  fixation was excellent.  PROCEDURE: The patient was identified in the preoperative holding area  where I personally marked the operative site after verifying site, side,  and procedure with the patient. An interscalene block given by  the attending anesthesiologist in the holding area and the patient was taken back to the operating room where all extremities were  carefully padded in position after general anesthesia was induced. She  was placed in a beach-chair position and the operative upper  extremity was  prepped and draped in a standard sterile fashion. An approximately 10-  cm incision was made from the tip of the coracoid process to the center  point of the humerus at the level of the axilla. Dissection was carried  down through subcutaneous tissues to the level of the cephalic vein  which was taken laterally with the deltoid. The pectoralis major was  retracted medially. The subdeltoid space was developed and the lateral  edge of the conjoined tendon was identified. The undersurface of  conjoined tendon was palpated and the musculocutaneous nerve was not in  the field. Retractor was placed underneath the conjoined and second  retractor was placed lateral into the deltoid. The circumflex humeral  artery and vessels were identified and clamped and coagulated. The  biceps tendon was tenodesed to the upper border of the pectoralis major.  The subscapularis was taken down as a peel with the underlying capsule.  The  joint was then gently externally rotated while the capsule was released  from the humeral neck around to just beyond the 6 o'clock position. At  this point, the joint was dislocated and the humeral head was presented  into the wound. The excessive osteophyte formation was removed with a  large rongeur.  The cutting guide was used to make the appropriate  head cut and the head was saved for potentially bone grafting.  The glenoid was exposed with the arm in an  abducted extended position. The anterior and posterior labrum were  completely excised and the capsule was released circumferentially to  allow  for exposure of the glenoid for preparation. The 2.5 mm drill was  placed using the guide in 5-10 inferior angulation and the tap was then advanced in the same hole. Small and large reamers were then used. The tap was then removed and the Metaglene was then screwed in with excellent purchase.  The peripheral guide was then used to drilled measured and filled peripheral  locking screws. The size 32 standard glenosphere was then impacted on the [facility] taper and the central screw was placed. The humerus was then again exposed and the diaphyseal reamers were used followed by the metaphyseal reamers. The final broach was left in place in the proximal trial was placed. The joint was reduced and with this implant it was felt that soft tissue tensioning was appropriate with excellent stability and excellent range of motion. Therefore, final humeral stem was placed press-fit.  And then the trial polyethylene inserts were tested again and the above implant was felt to be the most appropriate for final insertion. The joint was reduced taken through full range of motion and felt to be stable. Soft tissue tension was appropriate.  The joint was then copiously irrigated with pulse  lavage and the wound was then closed. The subscapularis was severely contracted and could not be repaired.  Skin was closed with 2-0 Vicryl in a deep dermal layer and 4-0  Monocryl for skin closure. Steri-Strips were applied. Sterile  dressings were then applied as well as a sling. The patient was allowed  to awaken from general anesthesia, transferred to stretcher, and taken  to recovery room in stable condition.   POSTOPERATIVE PLAN: The patient will be observed in the recovery room and if his pain is well-controlled with regional anesthesia and he is hemodynamically stable he could be discharged home today with his wife.

## 2022-08-18 NOTE — Transfer of Care (Signed)
Immediate Anesthesia Transfer of Care Note  Patient: Albert Barr  Procedure(s) Performed: REVERSE SHOULDER ARTHROPLASTY (Right: Shoulder)  Patient Location: PACU  Anesthesia Type:General  Level of Consciousness: awake, alert , oriented, and patient cooperative  Airway & Oxygen Therapy: Patient Spontanous Breathing and Patient connected to face mask oxygen  Post-op Assessment: Report given to RN and Post -op Vital signs reviewed and stable  Post vital signs: Reviewed and stable  Last Vitals:  Vitals Value Taken Time  BP 130/74 08/18/22 0930  Temp 36.4 C 08/18/22 0930  Pulse 58 08/18/22 0930  Resp 12 08/18/22 0930  SpO2 100 % 08/18/22 0930  Vitals shown include unvalidated device data.  Last Pain:  Vitals:   08/18/22 0615  TempSrc: Oral         Complications:  Encounter Notable Events  Notable Event Outcome Phase Comment  Difficult to intubate - expected  Intraprocedure Filed from anesthesia note documentation.

## 2022-08-18 NOTE — H&P (Signed)
Albert Barr is an 82 y.o. male.   Chief Complaint: R shoulder pain and dysfunction HPI: Endstage R shoulder arthritis with significant rotator cuff disease. pain and dysfunction, failed conservative measures.  Pain interferes with sleep and quality of life.   Past Medical History:  Diagnosis Date   Allergy    Anal fissure    Arthritis    shoulder, knees   Asthma    Atrophic gastritis without mention of hemorrhage    Cataract    bilateraly   Esophageal reflux    Family history of gastric cancer    Fibula fracture    hair line fracture   Gastric polyps    history   Hiatal hernia    Hx of adenomatous colonic polyps    Hyperlipidemia    Hypertension    Metaplasia of esophagus 2011   "gastric metaplasia"   Morbid obesity ([place])    Obstructive sleep apnea on CPAP    Other voice and resonance disorders    Peripheral neuropathy    Polyposis syndrome gastric and colonic - attenuated 12/09/2015   Sleep apnea    uses CPAP   Squamous cell skin cancer 04/2018   lesion left forearm   Unspecified asthma(493.90)     Past Surgical History:  Procedure Laterality Date   APPENDECTOMY     CHOLECYSTECTOMY     COLONOSCOPY  08/20/2009 (multiple)   Tubular adenoma   ESOPHAGOGASTRODUODENOSCOPY  08/20/09 (multiple)   EYE SURGERY Bilateral 2023   cataract surgery   INCISION AND DRAINAGE PERIRECTAL ABSCESS  06/27/2005   KNEE SURGERY     arthroscopy- both knees   POLYPECTOMY     UPPER GASTROINTESTINAL ENDOSCOPY      Family History  Problem Relation Age of Onset   Stomach cancer Mother 76   Cancer Sister        bladder   Sleep apnea Son    Colon cancer Neg Hx    Esophageal cancer Neg Hx    Rectal cancer Neg Hx    Social History:  reports that he quit smoking about 48 years ago. His smoking use included cigarettes. He has a 20.00 pack-year smoking history. He has never used smokeless tobacco. He reports current alcohol use. He reports that he does not use drugs.  Allergies:   Allergies  Allergen Reactions   Beclomethasone Rash   Moxifloxacin     REACTION: Diarrhea    Medications Prior to Admission  Medication Sig Dispense Refill   ADVAIR DISKUS 250-50 MCG/DOSE AEPB Inhale 1 puff into the lungs 2 (two) times daily.     albuterol (PROVENTIL HFA;VENTOLIN HFA) 108 (90 BASE) MCG/ACT inhaler Inhale 2 puffs into the lungs every 6 (six) hours as needed (Asthma).     ANDROGEL PUMP 20.25 MG/ACT (1.62%) GEL Apply 2 Squirts topically 2 (two) times daily.     ascorbic acid (VITAMIN C) 250 MG tablet Take 500 mg by mouth daily. Gummy     aspirin 81 MG tablet Take 81 mg by mouth daily.     Cyanocobalamin (B-12) 1000 MCG TABS Take 1,000 Units by mouth daily.     cycloSPORINE (RESTASIS) 0.05 % ophthalmic emulsion Place 1 drop into both eyes 2 (two) times daily.     ezetimibe (ZETIA) 10 MG tablet Take 10 mg by mouth daily.     fish oil-omega-3 fatty acids 1000 MG capsule Take 1 g by mouth daily.  300 mg /1000 mg fatty acid     folic acid (FOLVITE) 1 MG  tablet Take 1 mg by mouth daily.     gabapentin (NEURONTIN) 300 MG capsule Take 2 capsules (600 mg total) by mouth 3 (three) times daily. (Patient taking differently: Take 300 mg by mouth 2 (two) times daily.) 180 capsule 2   hydrochlorothiazide (HYDRODIURIL) 25 MG tablet TAKE 1/2 TABLET BY MOUTH EVERY DAY 60 tablet 0   iron polysaccharides (NIFEREX) 150 MG capsule Take 150 mg by mouth daily.     loratadine (CLARITIN) 10 MG tablet Take 10 mg by mouth daily as needed for allergies.     meloxicam (MOBIC) 15 MG tablet Take 1 tablet (15 mg total) by mouth daily as needed for pain. (Patient taking differently: Take 15 mg by mouth at bedtime.) 30 tablet 2   minocycline (MINOCIN,DYNACIN) 100 MG capsule Take 50 mg by mouth as needed (Rosacea). Reported on 09/07/2015     mirabegron ER (MYRBETRIQ) 25 MG TB24 tablet Take 25 mg by mouth daily.     omeprazole (PRILOSEC) 40 MG capsule Take 40 mg by mouth daily.     Polyethyl Glycol-Propyl  Glycol (SYSTANE ULTRA) 0.4-0.3 % SOLN Place 1-2 drops into both eyes daily as needed (Dry eyes).     pyridOXINE (VITAMIN B6) 25 MG tablet Take 25 mg by mouth daily.     rosuvastatin (CRESTOR) 20 MG tablet Take 20 mg by mouth daily.     Semaglutide-Weight Management (WEGOVY) 2.4 MG/0.75ML SOAJ Inject 2.4 mg into the skin once a week.     Vitamin D, Ergocalciferol, (DRISDOL) 1.25 MG (50000 UNIT) CAPS capsule Take 1 capsule by mouth once a week.     zafirlukast (ACCOLATE) 20 MG tablet Take 20 mg by mouth 2 (two) times daily.     AMBULATORY NON FORMULARY MEDICATION Wylene Men 2 1/2   Foot drop, bilateral  Codes: M21.371, M21.372 (Patient not taking: Reported on 08/09/2022) 1 Device 1    No results found for this or any previous visit (from the past 48 hour(s)). No results found.  Review of Systems  All other systems reviewed and are negative.   Blood pressure 139/79, pulse 72, temperature 98.4 F (36.9 C), temperature source Oral, resp. rate 18, height 5' 8.5" (1.74 m), weight 107.5 kg, SpO2 96 %. Physical Exam Constitutional:      Appearance: He is well-developed.  HENT:     Head: Atraumatic.  Eyes:     Extraocular Movements: Extraocular movements intact.  Cardiovascular:     Pulses: Normal pulses.  Pulmonary:     Effort: Pulmonary effort is normal.  Musculoskeletal:     Comments: R shoulder pain with limited ROM. NVID.  Skin:    General: Skin is warm and dry.  Neurological:     Mental Status: He is alert and oriented to person, place, and time.  Psychiatric:        Mood and Affect: Mood normal.      Assessment/Plan Endstage R shoulder arthritis with significant rotator cuff disease Plan R reverse TSA Risks / benefits of surgery discussed Consent on chart  NPO for OR Preop antibiotics   Rhae Hammock, MD 08/18/2022, 7:18 AM

## 2022-08-18 NOTE — Anesthesia Procedure Notes (Signed)
Anesthesia Regional Block: Interscalene brachial plexus block   Pre-Anesthetic Checklist: , timeout performed,  Correct Patient, Correct Site, Correct Laterality,  Correct Procedure, Correct Position, site marked,  Risks and benefits discussed,  Surgical consent,  Pre-op evaluation,  At surgeon's request and post-op pain management  Laterality: Upper and Right  Prep: Maximum Sterile Barrier Precautions used, chloraprep       Needles:  Injection technique: Single-shot  Needle Type: Echogenic Needle     Needle Length: 5cm  Needle Gauge: 21     Additional Needles:   Procedures:,,,, ultrasound used (permanent image in chart),,    Narrative:  Start time: 08/18/2022 7:08 AM End time: 08/18/2022 7:14 AM Injection made incrementally with aspirations every 5 mL.  Performed by: Personally  Anesthesiologist: Barnet Glasgow, MD  Additional Notes: Block assessed prior to procedure. Patient tolerated procedure well.

## 2022-08-18 NOTE — Anesthesia Postprocedure Evaluation (Signed)
Anesthesia Post Note  Patient: Albert Barr Presume  Procedure(s) Performed: REVERSE SHOULDER ARTHROPLASTY (Right: Shoulder)     Patient location during evaluation: PACU Anesthesia Type: Regional and General Level of consciousness: awake and alert Pain management: pain level controlled Vital Signs Assessment: post-procedure vital signs reviewed and stable Respiratory status: spontaneous breathing, nonlabored ventilation, respiratory function stable and patient connected to nasal cannula oxygen Cardiovascular status: blood pressure returned to baseline and stable Postop Assessment: no apparent nausea or vomiting Anesthetic complications: yes  Encounter Notable Events  Notable Event Outcome Phase Comment  Difficult to intubate - expected  Intraprocedure Filed from anesthesia note documentation.    Last Vitals:  Vitals:   08/18/22 1100 08/18/22 1115  BP: 110/70 109/61  Pulse: 64 65  Resp:    Temp:    SpO2: 93% 94%    Last Pain:  Vitals:   08/18/22 1030  TempSrc: Oral  PainSc: 4                  Barnet Glasgow

## 2022-08-18 NOTE — Discharge Instructions (Addendum)

## 2022-08-18 NOTE — Evaluation (Signed)
Occupational Therapy Evaluation Patient Details Name: Albert Barr MRN: [id number] DOB: Dec 24, 1940 Today's Date: 08/18/2022   History of Present Illness 82 yo s/p r reverse shoulder arthroplasty, PMH: fibula fx, HTN, OSA, PN, morbid obesity, B footdrop.   Clinical Impression   PTA pt requires assistance with ADL tasks due to balance deficits and R shoulder limitations. At baseline, pt ambulates using a walking cane and transfers by pulling up with BUE. Pt endorses a fall 1 week prior to surgery. Completed all education regarding compensatory strategies and management of RUE during mobility and ADL tasks. Pt required Mod A at times for mobility due to being unable to use RUE to pull up. Educated pt/wife on safe mobility techniques  - encouraging pt to "push up" from the chair using his LUE. Gait belt was issued and wife able to demonstrate transfer technique, however pt is a very high fall risk.  Contacted CM - recommend follow up with HHPT to reduce risk of falls. If HHPT is not an option, recommend outpt PT at a [facility] out[facility].      Recommendations for follow up therapy are one component of a multi-disciplinary discharge planning process, led by the attending physician.  Recommendations may be updated based on patient status, additional functional criteria and insurance authorization.   Follow Up Recommendations  Follow physician's recommendations for discharge plan and follow up therapies (discussed recommendation for continued PT to worok on balance)     Assistance Recommended at Discharge Frequent or constant Supervision/Assistance (direrct physical assistance with mobility and ADL)  Patient can return home with the following A little help with walking and/or transfers;A lot of help with bathing/dressing/bathroom;Assist for transportation;Help with stairs or ramp for entrance    Functional Status Assessment  Patient has had a recent decline in their functional status and  demonstrates the ability to make significant improvements in function in a reasonable and predictable amount of time.  Equipment Recommendations  Other (comment) (shower chair)    Recommendations for Other Services       Precautions / Restrictions Precautions Precautions: Fall;Shoulder Type of Shoulder Precautions: no shoulder ROM; elbow/wrist/hand only Shoulder Interventions: Off for dressing/bathing/exercises;Shoulder sling/immobilizer Precaution Booklet Issued: Yes (comment) Required Braces or Orthoses: Sling Restrictions Weight Bearing Restrictions: Yes RUE Weight Bearing: Non weight bearing      Mobility Bed Mobility                    Transfers Overall transfer level: Needs assistance Equipment used: 1 person hand held assist Transfers: Sit to/from Stand Sit to Stand: Mod assist           General transfer comment: pt with habit of pulling up to get his balance; educated on pushing up from handrest adn importance of shifting weight forward; habit of "falling" into chairs      Balance Overall balance assessment: History of Falls, Needs assistance (1 fall week before admission)   Sitting balance-Leahy Scale: Good       Standing balance-Leahy Scale: Poor                             ADL either performed or assessed with clinical judgement   ADL Overall ADL's : Needs assistance/impaired     Grooming: Minimal assistance   Upper Body Bathing: Minimal assistance   Lower Body Bathing: Moderate assistance;Sit to/from stand   Upper Body Dressing : Moderate assistance   Lower Body Dressing: Moderate assistance;Sit to/from  stand   Toilet Transfer: Minimal assistance;Ambulation   Toileting- Clothing Manipulation and Hygiene: Minimal assistance       Functional mobility during ADLs: Moderate assistance (at times) General ADL Comments: educated on compensatory strategies for ADL; pt has a Secondary school teacher that he uses to assist with ADL; wife  donn/sdoffs B AFOs adn shoes; Recommend sponge bathing until balance improves; would benefit from elevated toilet adn a shower chair - discussed with family; educated on strateiges to reduce risk of falls     Vision Baseline Vision/History: 1 Wears glasses       Perception     Praxis      Pertinent Vitals/Pain Pain Assessment Pain Assessment: Faces Faces Pain Scale: Hurts little more Pain Location: R shoulder Pain Descriptors / Indicators: Discomfort Pain Intervention(s): Limited activity within patient's tolerance, Repositioned     Hand Dominance Right   Extremity/Trunk Assessment Upper Extremity Assessment Upper Extremity Assessment: RUE deficits/detail RUE Deficits / Details: block still in effect; gross grasp and minimal extension no active elbow flexion/shoulder movemetn not attempted   Lower Extremity Assessment Lower Extremity Assessment: RLE deficits/detail;LLE deficits/detail RLE Deficits / Details: footdrop; PN LLE Deficits / Details: footdrop; PN   Cervical / Trunk Assessment Cervical / Trunk Assessment: Other exceptions (forward head; forward flexed at hips)   Communication Communication Communication: No difficulties   Cognition Arousal/Alertness: Awake/alert Behavior During Therapy: WFL for tasks assessed/performed Overall Cognitive Status: Within Functional Limits for tasks assessed                                       General Comments       Exercises Exercises: Shoulder Shoulder Exercises Elbow Flexion: AAROM, Right, 10 reps Elbow Extension: AAROM, Right, 10 reps Wrist Flexion: AAROM, Right, 5 reps Wrist Extension: AAROM, Right, 5 reps Digit Composite Flexion: AROM, Right, 5 reps Composite Extension: AROM, Right, 5 reps   Shoulder Instructions Shoulder Instructions Donning/doffing shirt without moving shoulder: Caregiver independent with task Method for sponge bathing under operated UE: Caregiver independent with  task Donning/doffing sling/immobilizer: Caregiver independent with task Correct positioning of sling/immobilizer: Caregiver independent with task ROM for elbow, wrist and digits of operated UE: Supervision/safety;Caregiver independent with task Sling wearing schedule (on at all times/off for ADL's): Caregiver independent with task Proper positioning of operated UE when showering: Caregiver independent with task Positioning of UE while sleeping: Caregiver independent with task    Home Living Family/patient expects to be discharged to:: Private residence Living Arrangements: Spouse/significant other Available Help at Discharge: Family;Available PRN/intermittently Type of Home: House Home Access: Stairs to enter [employer] of Steps: 4 Entrance Stairs-Rails: Right;Left Home Layout: One level     Bathroom Shower/Tub: Walk-in shower;Tub/shower unit   Bathroom Toilet: Standard Bathroom Accessibility: Yes How Accessible: Accessible via walker (sideways) Home Equipment: Cane - quad (walking stick)          Prior Functioning/Environment Prior Level of Function : Needs assist       Physical Assist : Mobility (physical);ADLs (physical) Mobility (physical): Transfers (wife assists with sit - stnad as needed; pt pulls up using BUE on rails or fmaily) ADLs (physical): Dressing;Bathing            OT Problem List: Decreased strength;Decreased range of motion;Impaired balance (sitting and/or standing);Decreased safety awareness;Decreased knowledge of use of DME or AE;Obesity;Pain;Impaired UE functional use      OT Treatment/Interventions:      OT Goals(Current  goals can be found in the care plan section) Acute Rehab OT Goals Patient Stated Goal: to not fall OT Goal Formulation: All assessment and education complete, DC therapy  OT Frequency:      Co-evaluation              AM-PAC OT "6 Clicks" Daily Activity     Outcome Measure Help from another person eating  meals?: A Little Help from another person taking care of personal grooming?: A Little Help from another person toileting, which includes using toliet, bedpan, or urinal?: A Lot Help from another person bathing (including washing, rinsing, drying)?: A Lot Help from another person to put on and taking off regular upper body clothing?: A Lot Help from another person to put on and taking off regular lower body clothing?: A Lot 6 Click Score: 14   End of Session Equipment Utilized During Treatment: Gait belt Nurse Communication: Mobility status;Other (comment) (DC needs)  Activity Tolerance: Patient tolerated treatment well Patient left: Other (comment) (transport chair)  OT Visit Diagnosis: Unsteadiness on feet (R26.81);Other abnormalities of gait and mobility (R26.89);Muscle weakness (generalized) (M62.81);History of falling (Z91.81);Pain Pain - Right/Left: Right Pain - part of body: Shoulder                Time: [id number] OT Time Calculation (min): 58 min Charges:  OT General Charges $OT Visit: 1 Visit OT Evaluation $OT Eval Moderate Complexity: 1 Mod OT Treatments $Self Care/Home Management : 23-37 mins  Maurie Boettcher, OT/L   Acute OT Clinical Specialist [place] Pager [phone] Office [phone]   [facility] 08/18/2022, 1:09 PM

## 2022-08-19 ENCOUNTER — Encounter (HOSPITAL_COMMUNITY): Payer: Self-pay | Admitting: Orthopedic Surgery

## 2022-08-31 ENCOUNTER — Other Ambulatory Visit: Payer: Self-pay | Admitting: Sports Medicine

## 2022-09-02 ENCOUNTER — Other Ambulatory Visit: Payer: Self-pay | Admitting: Sports Medicine

## 2022-09-02 DIAGNOSIS — M19011 Primary osteoarthritis, right shoulder: Secondary | ICD-10-CM | POA: Diagnosis not present

## 2022-09-07 ENCOUNTER — Other Ambulatory Visit: Payer: Self-pay | Admitting: Sports Medicine

## 2022-09-19 ENCOUNTER — Ambulatory Visit (INDEPENDENT_AMBULATORY_CARE_PROVIDER_SITE_OTHER): Payer: Medicare PPO | Admitting: Adult Health

## 2022-09-19 ENCOUNTER — Encounter: Payer: Self-pay | Admitting: Adult Health

## 2022-09-19 VITALS — BP 120/52 | HR 71 | Ht 68.5 in | Wt 249.2 lb

## 2022-09-19 DIAGNOSIS — G4733 Obstructive sleep apnea (adult) (pediatric): Secondary | ICD-10-CM

## 2022-09-19 NOTE — Progress Notes (Signed)
PATIENT: Albert Barr DOB: Sep 08, 1940  REASON FOR VISIT: follow up HISTORY FROM: patient PRIMARY NEUROLOGIST: Dr. Brett Fairy  Chief Complaint  Patient presents with   Follow-up    Rm 4, alone.  CPAP brought machine to [facility].  Doing ok.      HISTORY OF PRESENT ILLNESS: Today 09/19/22:  Albert Barr is a 82 y.o. male with a history of obstructive sleep apnea on CPAP. Returns today for follow-up.  Overall he feels that he is doing well.  Continues to find CPAP beneficial.  Download is below.       09/14/21: Albert Barr is an 82 year old male with a history of obstructive sleep apnea on CPAP.  He returns today for follow-up.  He reports that CPAP is working well.  He does feel that the pressure could be slightly increased.  Denies any new issues   REVIEW OF SYSTEMS: Out of a complete 14 system review of symptoms, the patient complains only of the following symptoms, and all other reviewed systems are negative.   ESS 3  ALLERGIES: Allergies  Allergen Reactions   Beclomethasone Rash   Moxifloxacin     REACTION: Diarrhea    HOME MEDICATIONS: Outpatient Medications Prior to Visit  Medication Sig Dispense Refill   ADVAIR DISKUS 250-50 MCG/DOSE AEPB Inhale 1 puff into the lungs 2 (two) times daily.     albuterol (PROVENTIL HFA;VENTOLIN HFA) 108 (90 BASE) MCG/ACT inhaler Inhale 2 puffs into the lungs every 6 (six) hours as needed (Asthma).     AMBULATORY NON FORMULARY MEDICATION Wylene Men 2 1/2   Foot drop, bilateral  Codes: M21.371, M21.372 1 Device 1   ANDROGEL PUMP 20.25 MG/ACT (1.62%) GEL Apply 2 Squirts topically 2 (two) times daily.     ascorbic acid (VITAMIN C) 250 MG tablet Take 500 mg by mouth daily. Gummy     aspirin 81 MG tablet Take 81 mg by mouth daily.     Cyanocobalamin (B-12) 1000 MCG TABS Take 1,000 Units by mouth daily.     cycloSPORINE (RESTASIS) 0.05 % ophthalmic emulsion Place 1 drop into both eyes 2 (two) times daily.     ezetimibe (ZETIA)  10 MG tablet Take 10 mg by mouth daily.     fish oil-omega-3 fatty acids 1000 MG capsule Take 1 g by mouth in the morning and at bedtime.  300 mg /1000 mg fatty acid     folic acid (FOLVITE) 1 MG tablet Take 1 mg by mouth daily.     gabapentin (NEURONTIN) 300 MG capsule Take 1 capsule (300 mg total) by mouth 2 (two) times daily. 180 capsule 2   hydrochlorothiazide (HYDRODIURIL) 25 MG tablet TAKE 1/2 TABLET BY MOUTH EVERY DAY 60 tablet 0   iron polysaccharides (NIFEREX) 150 MG capsule Take 150 mg by mouth daily.     loratadine (CLARITIN) 10 MG tablet Take 10 mg by mouth daily as needed for allergies.     meloxicam (MOBIC) 15 MG tablet TAKE 1 TABLET(15 MG) BY MOUTH DAILY AS NEEDED FOR PAIN 30 tablet 2   minocycline (MINOCIN,DYNACIN) 100 MG capsule Take 50 mg by mouth as needed (Rosacea). Reported on 09/07/2015     mirabegron ER (MYRBETRIQ) 25 MG TB24 tablet Take 25 mg by mouth daily.     omeprazole (PRILOSEC) 40 MG capsule Take 40 mg by mouth daily.     Polyethyl Glycol-Propyl Glycol (SYSTANE ULTRA) 0.4-0.3 % SOLN Place 1-2 drops into both eyes daily as needed (Dry eyes).  pyridOXINE (VITAMIN B6) 25 MG tablet Take 25 mg by mouth daily.     rosuvastatin (CRESTOR) 20 MG tablet Take 20 mg by mouth daily.     Semaglutide-Weight Management (WEGOVY) 2.4 MG/0.75ML SOAJ Inject 2.4 mg into the skin once a week.     Vitamin D, Ergocalciferol, (DRISDOL) 1.25 MG (50000 UNIT) CAPS capsule Take 1 capsule by mouth once a week.     zafirlukast (ACCOLATE) 20 MG tablet Take 20 mg by mouth 2 (two) times daily.     oxyCODONE (ROXICODONE) 5 MG immediate release tablet Take 1 tablet (5 mg total) by mouth every 4 (four) hours as needed for severe pain. (Patient not taking: Reported on 09/19/2022) 30 tablet 0   tiZANidine (ZANAFLEX) 2 MG tablet Take 1 tablet (2 mg total) by mouth every 8 (eight) hours as needed for muscle spasms. (Patient not taking: Reported on 09/19/2022) 30 tablet 0   No facility-administered  medications prior to visit.    PAST MEDICAL HISTORY: Past Medical History:  Diagnosis Date   Allergy    Anal fissure    Arthritis    shoulder, knees   Asthma    Atrophic gastritis without mention of hemorrhage    Cataract    bilateraly   Esophageal reflux    Family history of gastric cancer    Fibula fracture    hair line fracture   Gastric polyps    history   Hiatal hernia    Hx of adenomatous colonic polyps    Hyperlipidemia    Hypertension    Metaplasia of esophagus 2011   "gastric metaplasia"   Morbid obesity ([place])    Obstructive sleep apnea on CPAP    Other voice and resonance disorders    Peripheral neuropathy    Polyposis syndrome gastric and colonic - attenuated 12/09/2015   Sleep apnea    uses CPAP   Squamous cell skin cancer 04/2018   lesion left forearm   Unspecified asthma(493.90)     PAST SURGICAL HISTORY: Past Surgical History:  Procedure Laterality Date   APPENDECTOMY     CHOLECYSTECTOMY     COLONOSCOPY  08/20/2009 (multiple)   Tubular adenoma   ESOPHAGOGASTRODUODENOSCOPY  08/20/09 (multiple)   EYE SURGERY Bilateral 2023   cataract surgery   INCISION AND DRAINAGE PERIRECTAL ABSCESS  06/27/2005   KNEE SURGERY     arthroscopy- both knees   POLYPECTOMY     REVERSE SHOULDER ARTHROPLASTY Right 08/18/2022   Procedure: REVERSE SHOULDER ARTHROPLASTY;  Surgeon: Tania Ade, MD;  Location: [facility];  Service: Orthopedics;  Laterality: Right;   UPPER GASTROINTESTINAL ENDOSCOPY      FAMILY HISTORY: Family History  Problem Relation Age of Onset   Stomach cancer Mother 62   Cancer Sister        bladder   Sleep apnea Son    Colon cancer Neg Hx    Esophageal cancer Neg Hx    Rectal cancer Neg Hx     SOCIAL HISTORY: Social History   Socioeconomic History   Marital status: Married    Spouse name: Not on file   Number of children: 3   Years of education: Not on file   Highest education level: Not on file  Occupational History   Occupation:  Retired    Fish farm manager: [employer]    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  Tobacco Use   Smoking status: Former    Packs/day: 2.00    Years: 10.00    Additional pack years: 0.00  Total pack years: 20.00    Types: Cigarettes    Quit date: 02/16/1974    Years since quitting: 48.6   Smokeless tobacco: Never  Vaping Use   Vaping Use: Never used  Substance and Sexual Activity   Alcohol use: Yes    Comment: moderate   Drug use: No   Sexual activity: Not on file  Other Topics Concern   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, international aid [employer]   Previous Network engineer of Location manager administration   1 term [place] [employer]   [employer] [employer], retired, Engineer, drilling   Number smoker, moderate alcohol use described, no drug use   Social Determinants of Radio broadcast assistant Strain: Not on file  Food Insecurity: Not on file  Transportation Needs: Not on file  Physical Activity: Not on file  Stress: Not on file  Social Connections: Not on file  Intimate Partner Violence: Not on file      PHYSICAL EXAM  Vitals:   09/19/22 1426  BP: 100/63  Pulse: 71  Weight: 249 lb 3.2 oz (113 kg)  Height: 5' 8.5" (1.74 m)   Body mass index is 37.34 kg/m.  Generalized: Well developed, in no acute distress  Chest: Lungs clear to auscultation bilaterally  Neurological examination  Mentation: Alert oriented to time, place, history taking. Follows all commands speech and language fluent Cranial nerve II-XII: Extraocular movements were full, visual field were full on confrontational test Head turning and shoulder shrug  were normal and symmetric. Motor: The motor testing reveals 5 over 5 strength of all 4 extremities. Good symmetric motor tone is noted throughout.  Sensory: Sensory testing is intact to soft touch on all 4 extremities. No evidence of extinction is noted.  Gait and  station: Gait is normal.    DIAGNOSTIC DATA (LABS, IMAGING, TESTING) - I reviewed patient records, labs, notes, testing and imaging myself where available.  Lab Results  Component Value Date   WBC 6.5 08/11/2022   HGB 14.0 08/11/2022   HCT 41.6 08/11/2022   MCV 96.5 08/11/2022   PLT 152 08/11/2022      Component Value Date/Time   NA 142 08/11/2022 1507   K 3.6 08/11/2022 1507   CL 102 08/11/2022 1507   CO2 28 08/11/2022 1507   GLUCOSE 98 08/11/2022 1507   BUN 7 (L) 08/11/2022 1507   CREATININE 0.75 08/11/2022 1507   CALCIUM 9.2 08/11/2022 1507   PROT 6.9 04/23/2008 1012   ALBUMIN 4.0 04/23/2008 1012   AST 23 04/23/2008 1012   ALT 25 04/23/2008 1012   ALKPHOS 38 (L) 04/23/2008 1012   BILITOT 0.6 04/23/2008 1012   GFRNONAA >60 08/11/2022 1507   GFRAA 124 04/23/2008 1012   Lab Results  Component Value Date   CHOL 156 04/23/2008   HDL 42.3 04/23/2008   LDLCALC 89 04/23/2008   TRIG 122 04/23/2008   CHOLHDL 3.7 CALC 04/23/2008   Lab Results  Component Value Date   HGBA1C 5.6 05/18/2007   No results found for: "VITAMINB12" No results found for: "TSH"    ASSESSMENT AND PLAN 82 y.o. year old male  has a past medical history of Allergy, Anal fissure, Arthritis, Asthma, Atrophic gastritis without mention of hemorrhage, Cataract, Esophageal reflux, Family history of gastric cancer, Fibula fracture, Gastric polyps, Hiatal hernia, adenomatous colonic polyps, Hyperlipidemia, Hypertension, Metaplasia of esophagus (2011), Morbid obesity ([place]), Obstructive sleep  apnea on CPAP, Other voice and resonance disorders, Peripheral neuropathy, Polyposis syndrome gastric and colonic - attenuated (12/09/2015), Sleep apnea, Squamous cell skin cancer (04/2018), and Unspecified asthma(493.90). here with:  OSA on CPAP  - CPAP compliance excellent - Good treatment of AHI  - Encourage patient to use CPAP nightly and > 4 hours each night - Increase pressure 8 to 14 - BP slightly low- advised  to check it at home - F/U in 1 year or sooner if needed    Ward Givens, MSN, NP-C 09/19/2022, 2:34 PM [facility] [address] [place], [place] [address] [phone]

## 2022-09-21 NOTE — Progress Notes (Signed)
New, Albert Rosenthal, RN; Albert Barr; Albert Barr; Albert Barr Received, Thank you!       Previous Messages    ----- Message ----- From: Brandon Melnick, RN Sent: 09/20/2022  11:36 AM EDT To: Darlina Guys; Miquel Dunn; Albert Barr; * Subject: pressure change to 8-14                        Good morning,  New order in EPIC for pressure change on this pt.  I did make change in airview and is pended  [employer]. Trevious Fouche" Male, 82 y.o., [birth year] Pronouns: he/him/his MRN: [id number]   [place]

## 2022-10-18 ENCOUNTER — Other Ambulatory Visit: Payer: Self-pay | Admitting: Sports Medicine

## 2022-10-24 DIAGNOSIS — I872 Venous insufficiency (chronic) (peripheral): Secondary | ICD-10-CM | POA: Diagnosis not present

## 2022-10-24 DIAGNOSIS — I10 Essential (primary) hypertension: Secondary | ICD-10-CM | POA: Diagnosis not present

## 2022-10-24 DIAGNOSIS — R269 Unspecified abnormalities of gait and mobility: Secondary | ICD-10-CM | POA: Diagnosis not present

## 2022-10-24 DIAGNOSIS — G609 Hereditary and idiopathic neuropathy, unspecified: Secondary | ICD-10-CM | POA: Diagnosis not present

## 2022-10-24 DIAGNOSIS — I739 Peripheral vascular disease, unspecified: Secondary | ICD-10-CM | POA: Diagnosis not present

## 2022-10-24 DIAGNOSIS — E785 Hyperlipidemia, unspecified: Secondary | ICD-10-CM | POA: Diagnosis not present

## 2022-10-24 DIAGNOSIS — G4730 Sleep apnea, unspecified: Secondary | ICD-10-CM | POA: Diagnosis not present

## 2022-10-24 DIAGNOSIS — E291 Testicular hypofunction: Secondary | ICD-10-CM | POA: Diagnosis not present

## 2022-10-25 DIAGNOSIS — H02423 Myogenic ptosis of bilateral eyelids: Secondary | ICD-10-CM | POA: Diagnosis not present

## 2022-11-02 DIAGNOSIS — L820 Inflamed seborrheic keratosis: Secondary | ICD-10-CM | POA: Diagnosis not present

## 2022-11-02 DIAGNOSIS — L821 Other seborrheic keratosis: Secondary | ICD-10-CM | POA: Diagnosis not present

## 2022-11-14 DIAGNOSIS — M19011 Primary osteoarthritis, right shoulder: Secondary | ICD-10-CM | POA: Diagnosis not present

## 2022-12-20 ENCOUNTER — Ambulatory Visit (INDEPENDENT_AMBULATORY_CARE_PROVIDER_SITE_OTHER): Payer: Medicare PPO | Admitting: Sports Medicine

## 2022-12-20 ENCOUNTER — Other Ambulatory Visit: Payer: Self-pay

## 2022-12-20 VITALS — BP 133/64 | Ht 68.0 in | Wt 245.0 lb

## 2022-12-20 DIAGNOSIS — M792 Neuralgia and neuritis, unspecified: Secondary | ICD-10-CM | POA: Diagnosis not present

## 2022-12-20 DIAGNOSIS — M79642 Pain in left hand: Secondary | ICD-10-CM | POA: Diagnosis not present

## 2022-12-20 MED ORDER — GABAPENTIN 300 MG PO CAPS: 600.0000 mg | ORAL_CAPSULE | Freq: Two times a day (BID) | ORAL | 3 refills | Status: DC

## 2022-12-20 MED ORDER — GABAPENTIN 300 MG PO CAPS: 900.0000 mg | ORAL_CAPSULE | Freq: Two times a day (BID) | ORAL | 2 refills | Status: DC

## 2022-12-20 NOTE — Assessment & Plan Note (Signed)
Clinical and radiographic evidence of carpal tunnel syndrome and some ulnar neuropathy.  We will increase his daily gabapentin 900 mg twice daily, new prescription sent to his pharmacy today.  He was also fitted for a nighttime cock up wrist splint and elbow compression sleeve.  Hopeful to help to alleviate some of his symptoms.  Follow-up in 6 weeks.

## 2022-12-20 NOTE — Progress Notes (Signed)
   Established Patient Office Visit  Subjective   Patient ID: Albert Barr, male    DOB: 19-Feb-1941  Age: 82 y.o. MRN: [id number]  Left arm pain and numbness and tingling.  He is here today with chief complaint of some left arm pain and hand finger numbness and tingling.  He reports has been going on for quite some time now.  He initially thought it was coming from his shoulder however he saw his neurologist who diagnosed him with carpal tunnel syndrome.  He reports his pain is worse in the mornings and in his pinky and ring finger.  When he extends his elbow and shoulder he gets some relief of his pain.  He has tried a compression sleeve in the evenings with only temporary relief.  He ran out of his gabapentin recently so has not been taking that.  He is here today for further evaluation.  Of note he had his right shoulder replaced by Dr. Ave Filter and has been doing very well.   ROS as listed above in HPI    Objective:     BP 133/64   Ht 5\' 8"  (1.727 m)   Wt 245 lb (111.1 kg)   BMI 37.25 kg/m   Physical Exam Vitals reviewed.  Constitutional:      Appearance: Normal appearance. He is obese.  HENT:     Head: Normocephalic.  Pulmonary:     Effort: Pulmonary effort is normal.  Neurological:     Mental Status: He is alert.   Seated comfortably in exam room.  Left arm, no obvious deformity or asymmetry.  He has some decreased range of motion in his left shoulder.  Wrist: No obvious deformity or asymmetry.  He has some decreased range of motion with supination.  Sensation intact to light touch mild palmar and dorsal aspect of the hand.  Grip strength 5/5 bilaterally.  Radial pulse 2+.  He has good strength resisted wrist flexion and extension.  Pincer grasp 5/5.  No obvious thenar atrophy.  Negative Tinel's at the wrist.  Positive Tinel's at the elbow.  Limited ultrasound left wrist: Median nerve was visualized, bifid median nerve was seen.  CSA approximated to be 0.19 cm Ulnar  nerve was visualized at the elbow, CSA approximately 0.15 cm Impression: There is some enlargement about the median and ulnar nerve which could be related to his symptoms carpal tunnel and ulnar neuropathy  Ultrasound and interpretation by Albert Barr. Darrick Penna, MD  and Lawrence Marseilles. Mahlani Berninger. DO    Assessment & Plan:   Problem List Items Addressed This Visit       Other   Peripheral neuropathic pain   Relevant Orders   Ambulatory referral to Neurology   Left hand pain - Primary    Clinical and radiographic evidence of carpal tunnel syndrome and some ulnar neuropathy.  We will increase his daily gabapentin 900 mg twice daily, new prescription sent to his pharmacy today.  He was also fitted for a nighttime cock up wrist splint and elbow compression sleeve.  Hopeful to help to alleviate some of his symptoms.  Follow-up in 6 weeks.      Relevant Orders   [place] LIMITED JOINT SPACE STRUCTURES UP LEFT    Return in about 6 weeks (around 01/31/2023).    Claudie Leach, DO  I observed and examined the patient with the [facility] resident and agree with assessment and plan.  Note reviewed and modified by me. Sterling Big, MD

## 2022-12-21 ENCOUNTER — Telehealth: Payer: Self-pay | Admitting: Internal Medicine

## 2022-12-21 DIAGNOSIS — G4733 Obstructive sleep apnea (adult) (pediatric): Secondary | ICD-10-CM | POA: Diagnosis not present

## 2022-12-21 DIAGNOSIS — G609 Hereditary and idiopathic neuropathy, unspecified: Secondary | ICD-10-CM

## 2022-12-21 NOTE — Telephone Encounter (Signed)
Received call from patient requesting Dr. Leone Payor provide him with the name of a neurologist within the [facility] system.  He has neuropathy and has been going to [facility] in [place], but that is too far of a drive for him and he wants to come to [place].  Please call patient and advise.    Also, he scheduled an OV in October with Dr. Leone Payor to discuss the possibility of an EGD as his mother died from stomach cancer, but he would like the "referral" to the neurologist prior to that appointment, if possible.  Thank you.

## 2022-12-21 NOTE — Telephone Encounter (Signed)
Patient is advised that we have placed a referral to Dr Everlena Cooper with [facility], however it may take months for them to have an availability to see him. Patient verbalizes understanding of this referral.

## 2022-12-21 NOTE — Telephone Encounter (Signed)
We can place a referral to Dr. Everlena Cooper fore this patient - Hereditary and idiopathic neuropathy  Make sure he is aware it will take months

## 2022-12-31 IMAGING — DX DG SHOULDER 2+V*L*
3 series · 3 of 3 positions shown · non-contrast
Comparison: None.

CLINICAL DATA: Shoulder pain

EXAM:
LEFT SHOULDER - 2+ VIEW

[dg shoulder left (1 of 3)]
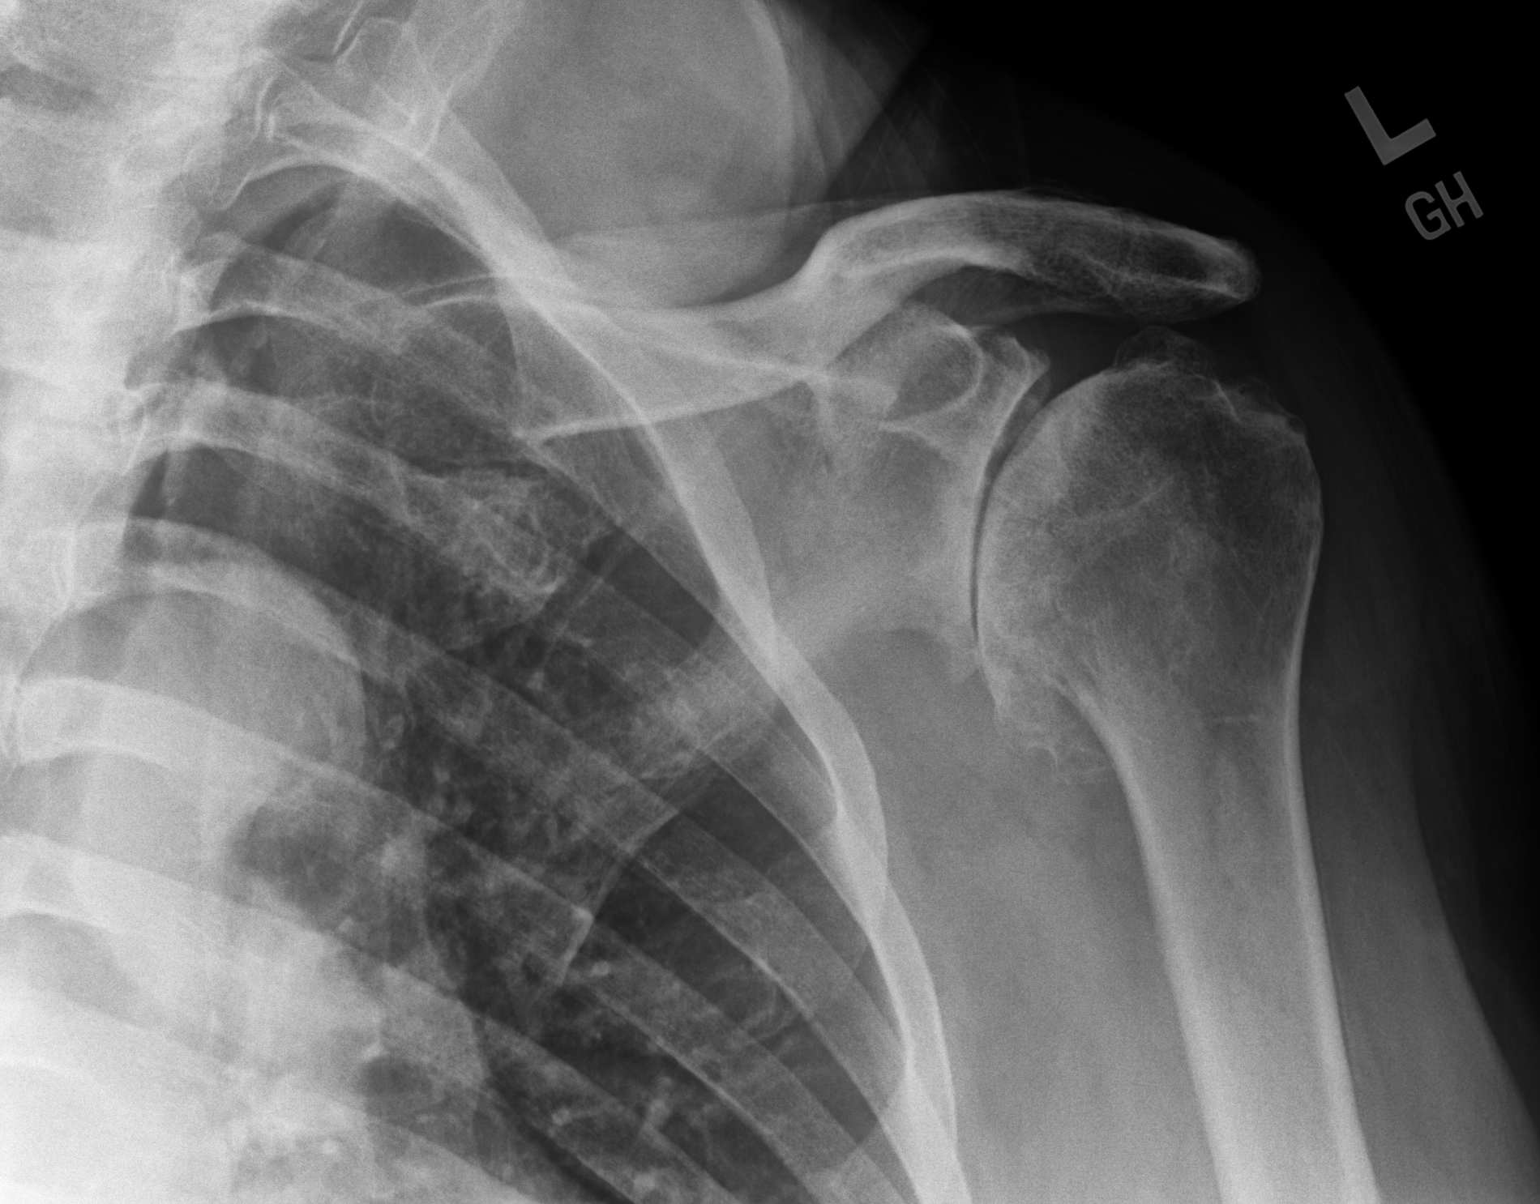

[dg shoulder left (2 of 3)]
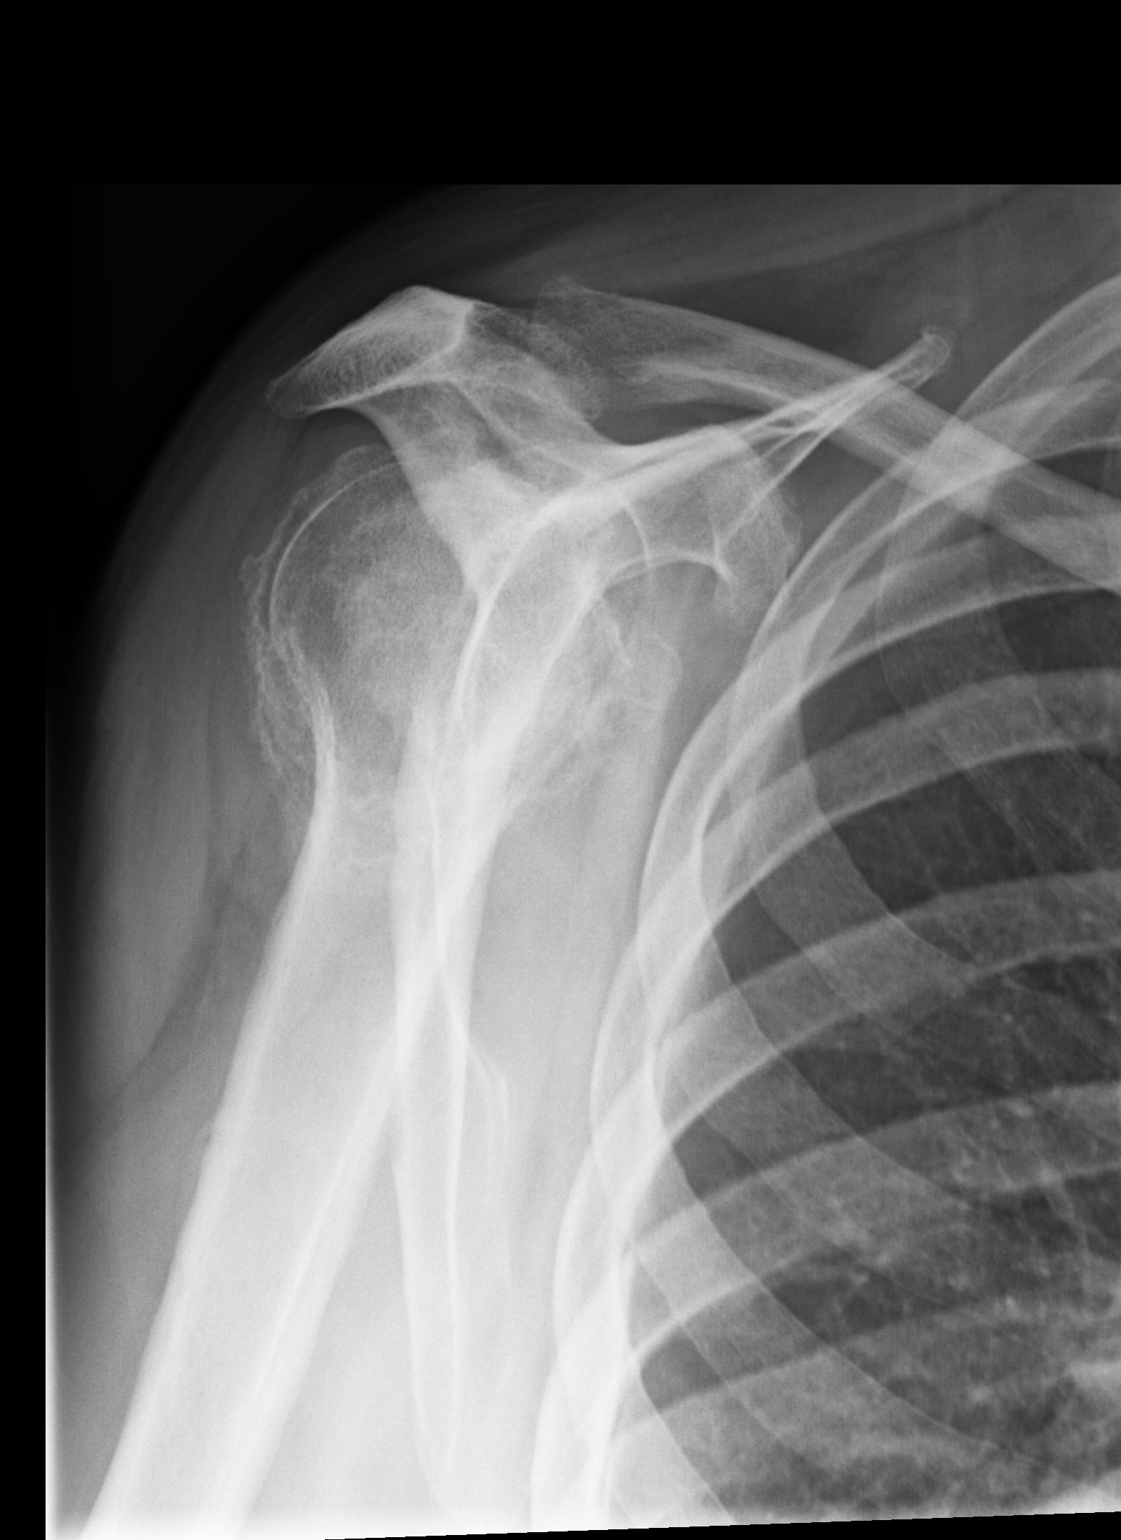

[dg shoulder left (3 of 3)]
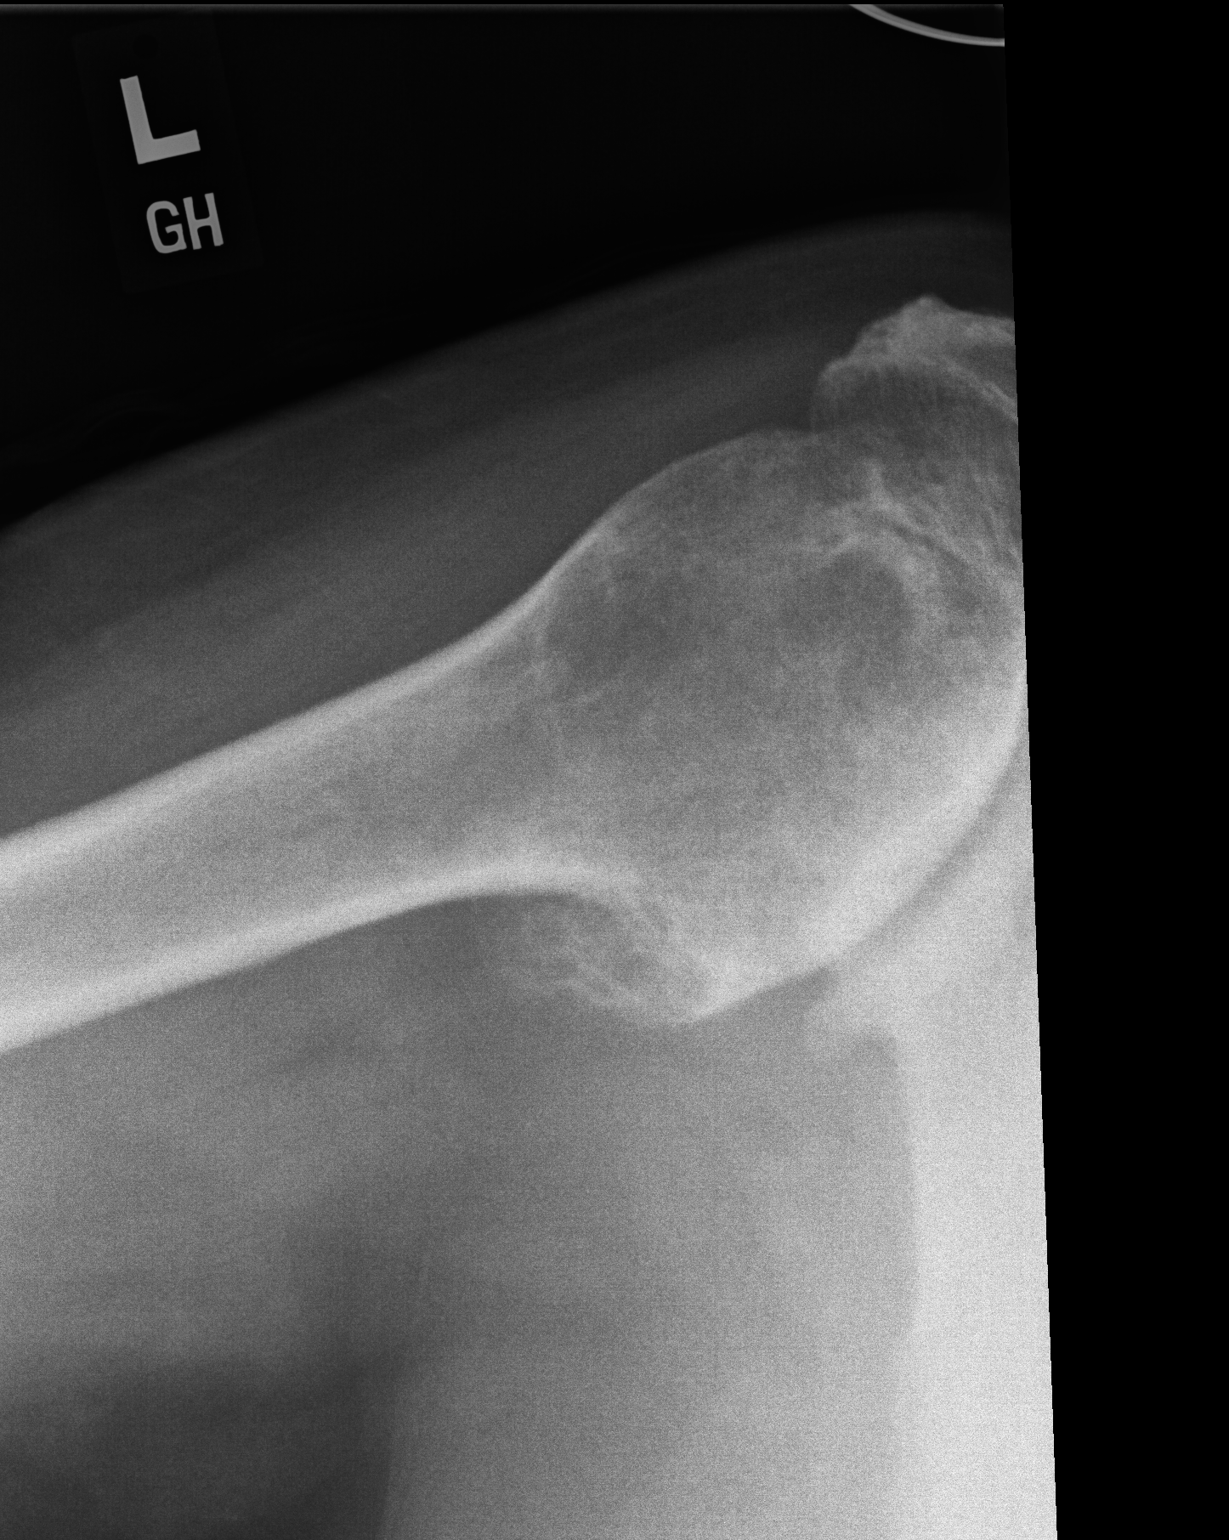

[3 of 3 positions shown; findings below may reference images not displayed]

FINDINGS: No fracture or malalignment. Advanced arthritis of the left shoulder
with bone on bone appearance, sclerosis and bulky osteophyte. Bulky
spurring at the humeral head results in subacromial space narrowing.
There is mild AC joint degenerative change
IMPRESSION: Advanced arthritis of the left shoulder

## 2022-12-31 IMAGING — DX DG KNEE AP/LAT W/ SUNRISE*L*
3 series · 3 of 3 positions shown · non-contrast
Comparison: None.

CLINICAL DATA: Chronic left knee pain.

EXAM:
LEFT KNEE 3 VIEWS

[view not recorded (1 of 2)]
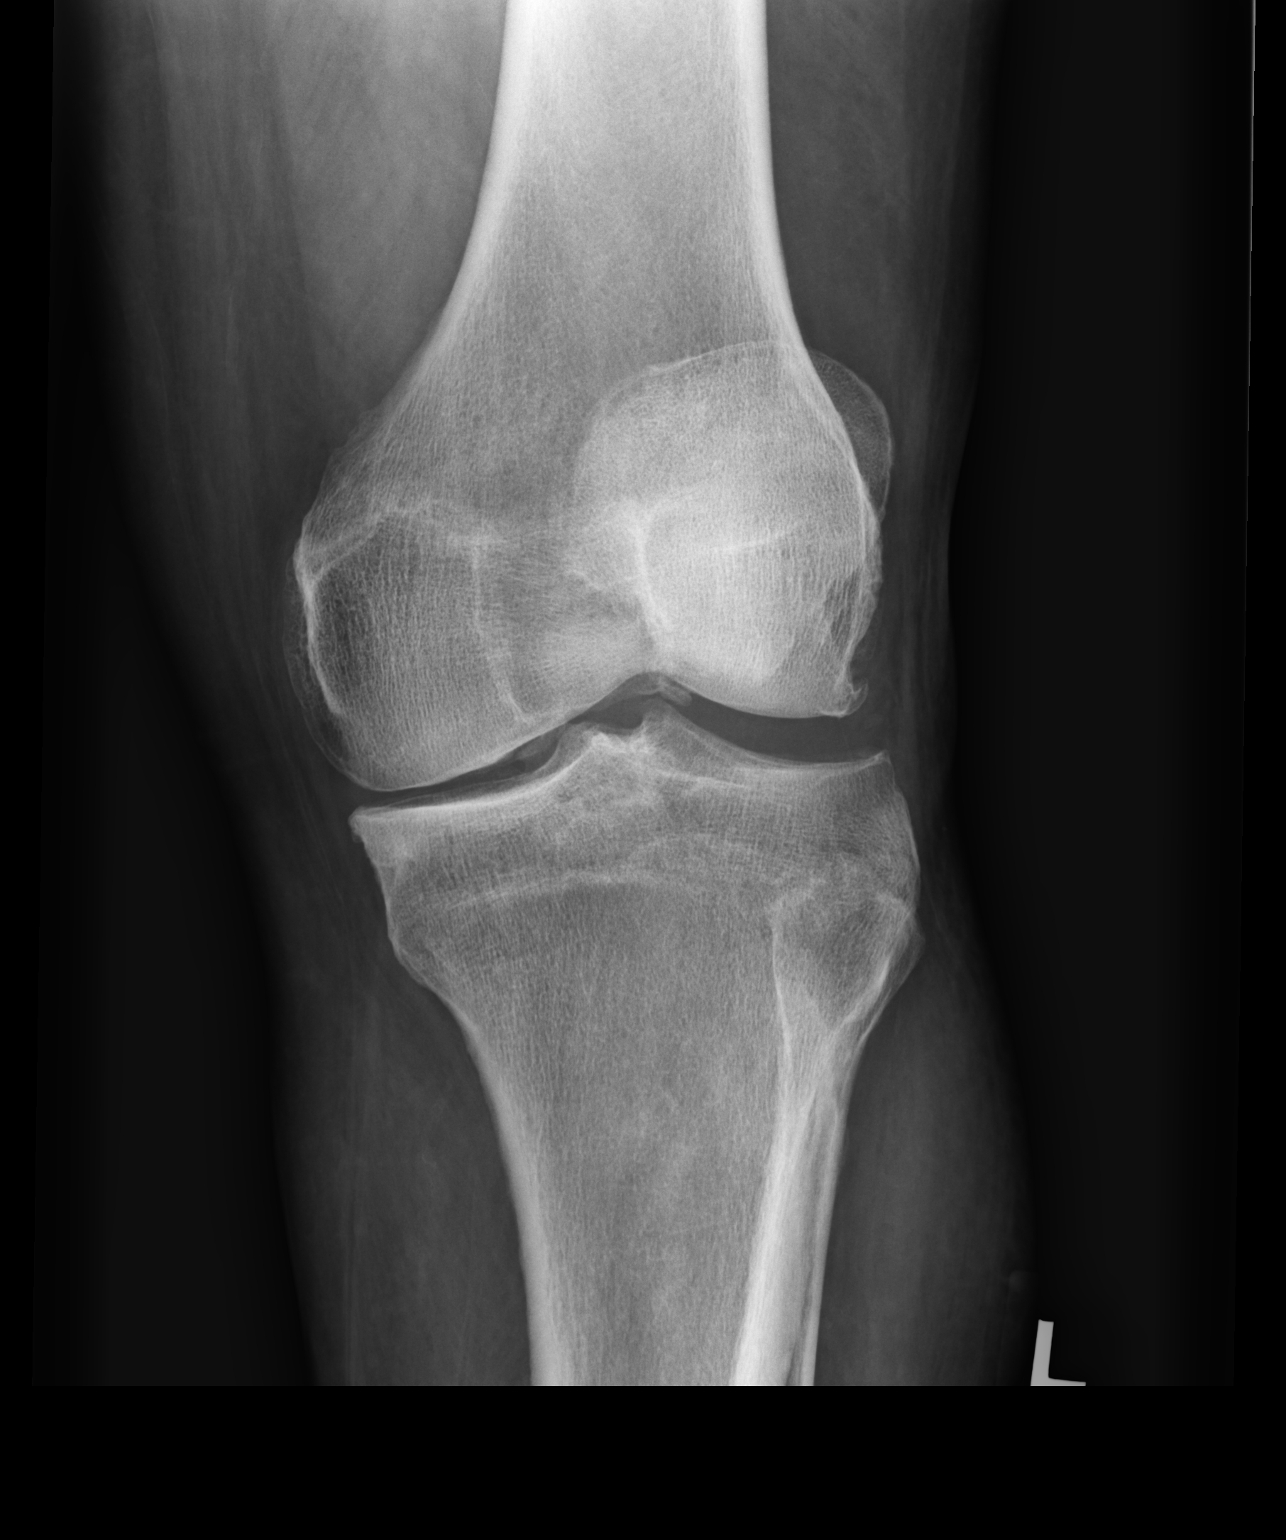

[view not recorded (2 of 2)]
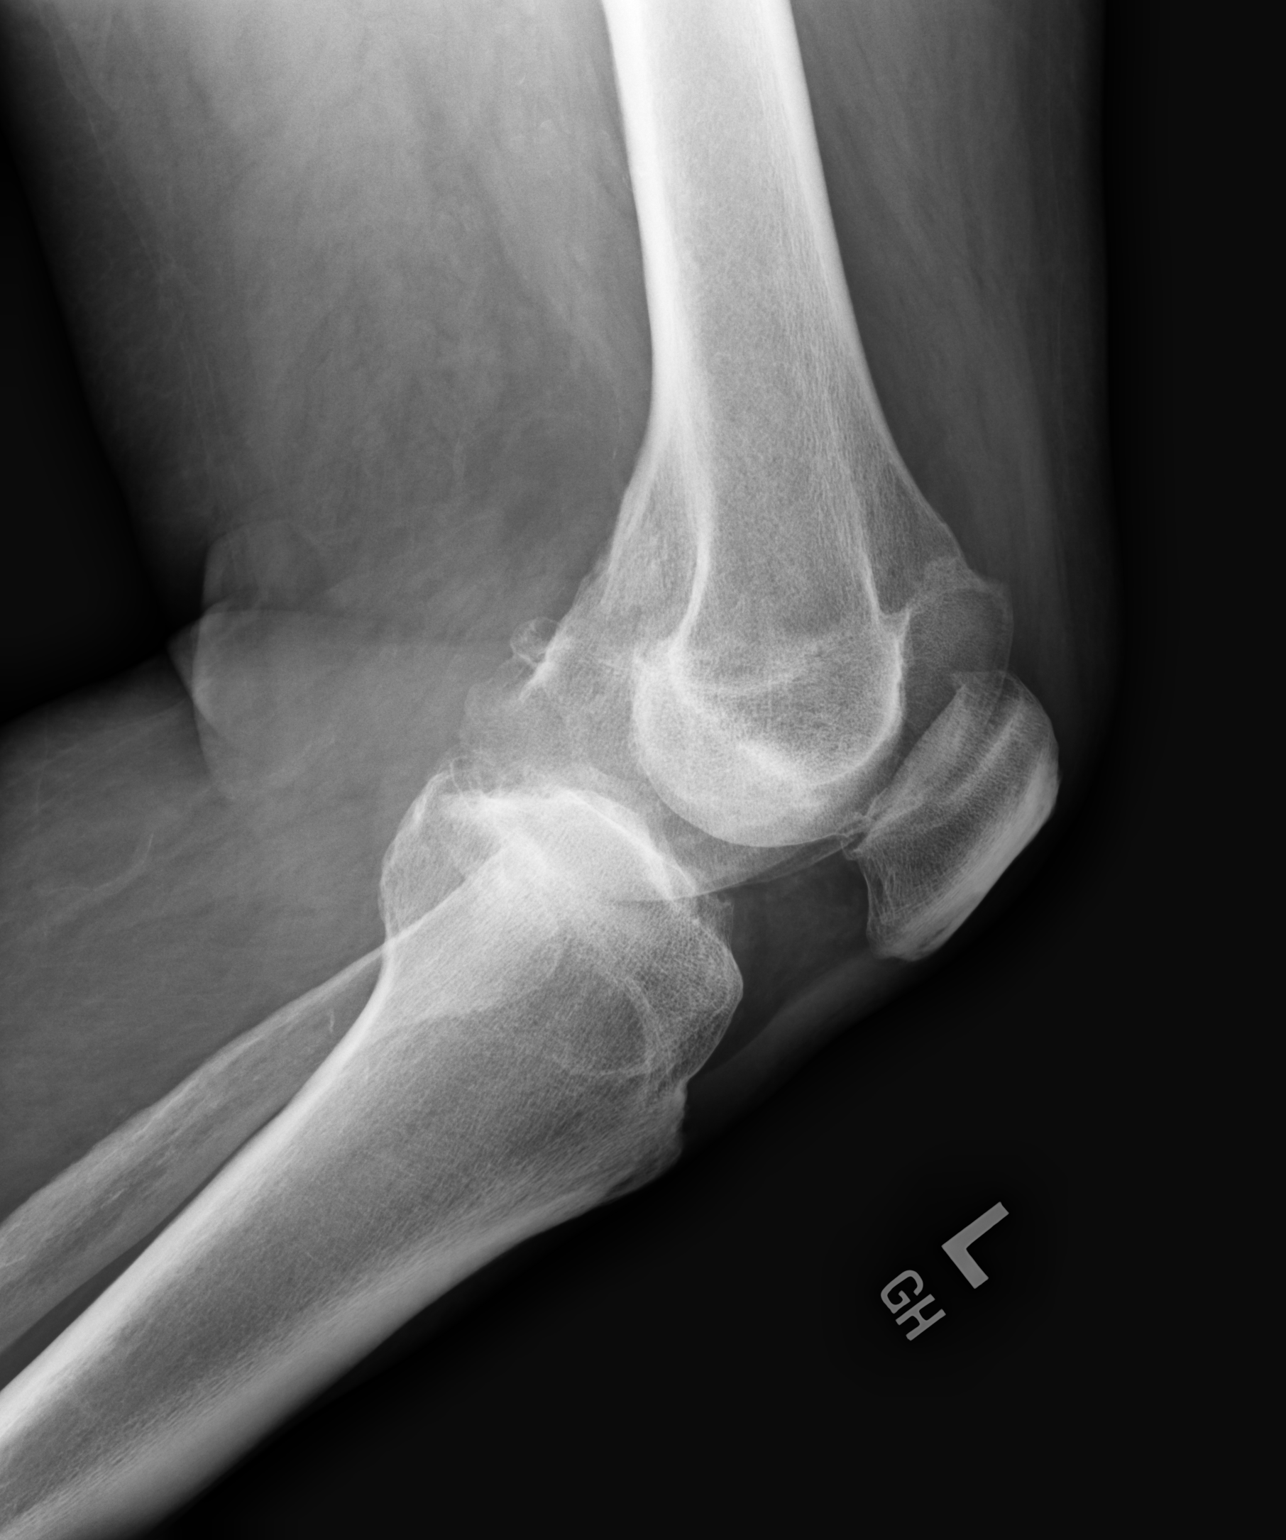

[dg knee ap/lat w/ sunrise left]
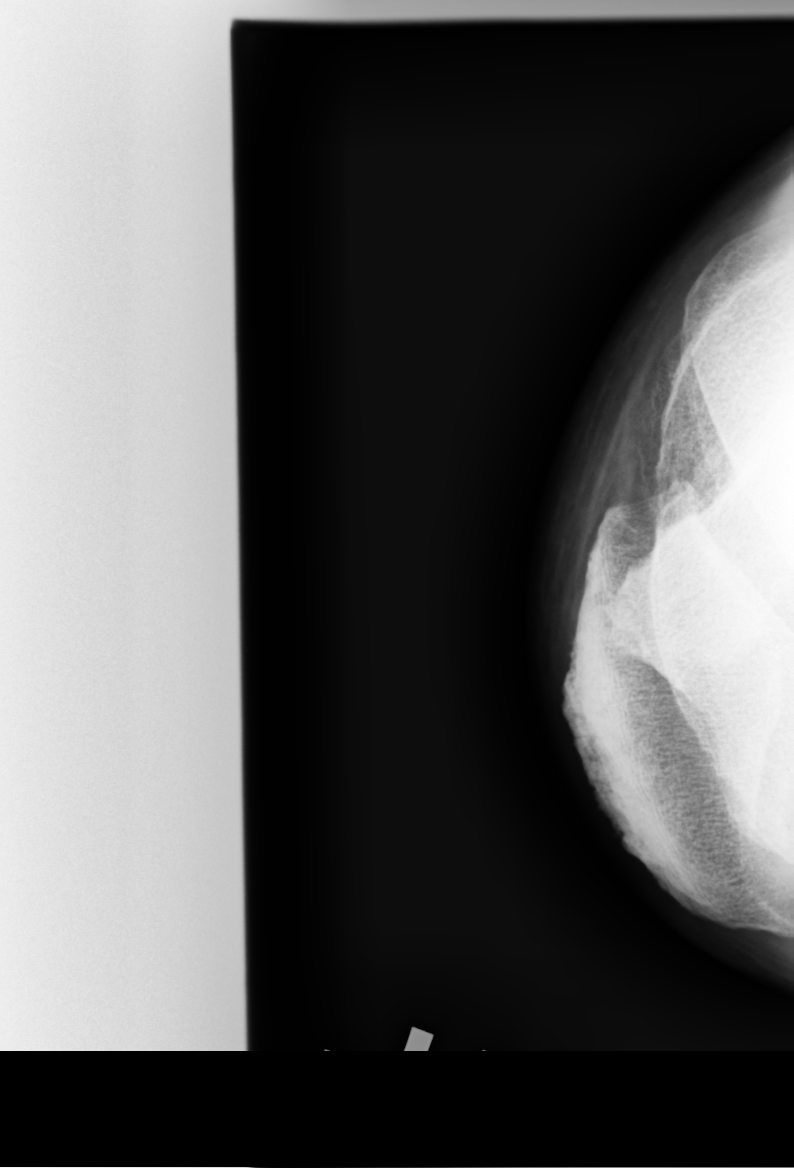

[3 of 3 positions shown; findings below may reference images not displayed]

FINDINGS: No evidence of fracture, dislocation, or joint effusion. There is
marked severity medial tibiofemoral compartment space narrowing.
Mild to moderate severity patellofemoral narrowing is also seen.
Soft tissues are unremarkable.
IMPRESSION: Degenerative changes, as described above, without an acute osseous
abnormality.

## 2023-01-19 ENCOUNTER — Other Ambulatory Visit: Payer: Self-pay | Admitting: Sports Medicine

## 2023-01-23 DIAGNOSIS — Z961 Presence of intraocular lens: Secondary | ICD-10-CM | POA: Diagnosis not present

## 2023-01-23 DIAGNOSIS — H04123 Dry eye syndrome of bilateral lacrimal glands: Secondary | ICD-10-CM | POA: Diagnosis not present

## 2023-01-23 DIAGNOSIS — H52223 Regular astigmatism, bilateral: Secondary | ICD-10-CM | POA: Diagnosis not present

## 2023-01-23 DIAGNOSIS — H40053 Ocular hypertension, bilateral: Secondary | ICD-10-CM | POA: Diagnosis not present

## 2023-01-23 DIAGNOSIS — H524 Presbyopia: Secondary | ICD-10-CM | POA: Diagnosis not present

## 2023-01-23 DIAGNOSIS — H5203 Hypermetropia, bilateral: Secondary | ICD-10-CM | POA: Diagnosis not present

## 2023-01-26 ENCOUNTER — Other Ambulatory Visit: Payer: Self-pay | Admitting: Orthopedic Surgery

## 2023-02-01 NOTE — Patient Instructions (Addendum)
SURGICAL WAITING ROOM VISITATION  Patients having surgery or a procedure may have no more than 2 support people in the waiting area - these visitors may rotate.    Children under the age of 51 must have an adult with them who is not the patient.  Due to an increase in RSV and influenza rates and associated hospitalizations, children ages 47 and under may not visit patients in [facility] hospitals.  If the patient needs to stay at the hospital during part of their recovery, the visitor guidelines for inpatient rooms apply. Pre-op nurse will coordinate an appropriate time for 1 support person to accompany patient in pre-op.  This support person may not rotate.    Please refer to the [facility] website for the visitor guidelines for Inpatients (after your surgery is over and you are in a regular room).    Your procedure is scheduled on: 02/16/23   Report to [facility] Main Entrance    Report to admitting at 5:15 AM   Call this number if you have problems the morning of surgery [phone]   Do not eat food :After Midnight.   After Midnight you may have the following liquids until 4:30 AM DAY OF SURGERY  Water Non-Citrus Juices (without pulp, NO RED-Apple, White grape, White cranberry) Black Coffee (NO MILK/CREAM OR CREAMERS, sugar ok)  Clear Tea (NO MILK/CREAM OR CREAMERS, sugar ok) regular and decaf                             Plain Jell-O (NO RED)                                           Fruit ices (not with fruit pulp, NO RED)                                     Popsicles (NO RED)                                                               Sports drinks like Gatorade (NO RED)               The day of surgery:  Drink ONE (1) Pre-Surgery Clear Ensure at 4:30 AM the morning of surgery. Drink in one sitting. Do not sip.  This drink was given to you during your hospital  pre-op appointment visit. Nothing else to drink after completing the  Pre-Surgery Clear Ensure.           If you have questions, please contact your surgeon's office.   FOLLOW BOWEL PREP AND ANY ADDITIONAL PRE OP INSTRUCTIONS YOU RECEIVED FROM YOUR SURGEON'S OFFICE!!!     Oral Hygiene is also important to reduce your risk of infection.                                    Remember - BRUSH YOUR TEETH THE MORNING OF SURGERY WITH YOUR REGULAR TOOTHPASTE  DENTURES WILL BE REMOVED  PRIOR TO SURGERY PLEASE DO NOT APPLY "Poly grip" OR ADHESIVES!!!   Stop all vitamins and herbal supplements 7 days before surgery.   Hold Wegovy 7 days. DO NOT take 02/15/23.   Take these medicines the morning of surgery with A SIP OF WATER: Inhalers, Zetia, Gabapentin, Claritin, Omeprazole, Rosuvastatin  Bring CPAP mask and tubing day of surgery.                              You may not have any metal on your body including jewelry, and body piercing             Do not wear lotions, powders, cologne, or deodorant              Men may shave face and neck.   Do not bring valuables to the hospital. [facility] IS NOT             RESPONSIBLE   FOR VALUABLES.   Contacts, glasses, dentures or bridgework may not be worn into surgery.  DO NOT BRING YOUR HOME MEDICATIONS TO THE HOSPITAL. PHARMACY WILL DISPENSE MEDICATIONS LISTED ON YOUR MEDICATION LIST TO YOU DURING YOUR ADMISSION IN THE HOSPITAL!    Patients discharged on the day of surgery will not be allowed to drive home.  Someone NEEDS to stay with you for the first 24 hours after anesthesia.   Special Instructions: Bring a copy of your healthcare power of attorney and living will documents the day of surgery if you haven't scanned them before.              Please read over the following fact sheets you were given: IF YOU HAVE QUESTIONS ABOUT YOUR PRE-OP INSTRUCTIONS PLEASE CALL [phone] Contras   If you received a COVID test during your pre-op visit  it is requested that you wear a mask when out in public, stay away from anyone that may not be feeling  well and notify your surgeon if you develop symptoms. If you test positive for Covid or have been in contact with anyone that has tested positive in the last 10 days please notify you surgeon.      Pre-operative 5 CHG Bath Instructions   You can play a key role in reducing the risk of infection after surgery. Your skin needs to be as free of germs as possible. You can reduce the number of germs on your skin by washing with CHG (chlorhexidine gluconate) soap before surgery. CHG is an antiseptic soap that kills germs and continues to kill germs even after washing.   DO NOT use if you have an allergy to chlorhexidine/CHG or antibacterial soaps. If your skin becomes reddened or irritated, stop using the CHG and notify one of our RNs at [phone].   Please shower with the CHG soap starting 4 days before surgery using the following schedule:     Please keep in mind the following:  DO NOT shave, including legs and underarms, starting the day of your first shower.   You may shave your face at any point before/day of surgery.  Place clean sheets on your bed the day you start using CHG soap. Use a clean washcloth (not used since being washed) for each shower. DO NOT sleep with pets once you start using the CHG.   CHG Shower Instructions:  If you choose to wash your hair and private area, wash first with your normal shampoo/soap.  After you  use shampoo/soap, rinse your hair and body thoroughly to remove shampoo/soap residue.  Turn the water OFF and apply about 3 tablespoons (45 ml) of CHG soap to a CLEAN washcloth.  Apply CHG soap ONLY FROM YOUR NECK DOWN TO YOUR TOES (washing for 3-5 minutes)  DO NOT use CHG soap on face, private areas, open wounds, or sores.  Pay special attention to the area where your surgery is being performed.  If you are having back surgery, having someone wash your back for you may be helpful. Wait 2 minutes after CHG soap is applied, then you may rinse off the CHG soap.   Pat dry with a clean towel  Put on clean clothes/pajamas   If you choose to wear lotion, please use ONLY the CHG-compatible lotions on the back of this paper.     Additional instructions for the day of surgery: DO NOT APPLY any lotions, deodorants, cologne, or perfumes.   Put on clean/comfortable clothes.  Brush your teeth.  Ask your nurse before applying any prescription medications to the skin.      CHG Compatible Lotions   Aveeno Moisturizing lotion  Cetaphil Moisturizing Cream  Cetaphil Moisturizing Lotion  Clairol Herbal Essence Moisturizing Lotion, Dry Skin  Clairol Herbal Essence Moisturizing Lotion, Extra Dry Skin  Clairol Herbal Essence Moisturizing Lotion, Normal Skin  Curel Age Defying Therapeutic Moisturizing Lotion with Alpha Hydroxy  Curel Extreme Care Body Lotion  Curel Soothing Hands Moisturizing Hand Lotion  Curel Therapeutic Moisturizing Cream, Fragrance-Free  Curel Therapeutic Moisturizing Lotion, Fragrance-Free  Curel Therapeutic Moisturizing Lotion, Original Formula  Eucerin Daily Replenishing Lotion  Eucerin Dry Skin Therapy Plus Alpha Hydroxy Crme  Eucerin Dry Skin Therapy Plus Alpha Hydroxy Lotion  Eucerin Original Crme  Eucerin Original Lotion  Eucerin Plus Crme Eucerin Plus Lotion  Eucerin TriLipid Replenishing Lotion  Keri Anti-Bacterial Hand Lotion  Keri Deep Conditioning Original Lotion Dry Skin Formula Softly Scented  Keri Deep Conditioning Original Lotion, Fragrance Free Sensitive Skin Formula  Keri Lotion Fast Absorbing Fragrance Free Sensitive Skin Formula  Keri Lotion Fast Absorbing Softly Scented Dry Skin Formula  Keri Original Lotion  Keri Skin Renewal Lotion Keri Silky Smooth Lotion  Keri Silky Smooth Sensitive Skin Lotion  Nivea Body Creamy Conditioning Oil  Nivea Body Extra Enriched Lotion  Nivea Body Original Lotion  Nivea Body Sheer Moisturizing Lotion Nivea Crme  Nivea Skin Firming Lotion  NutraDerm 30 Skin Lotion   NutraDerm Skin Lotion  NutraDerm Therapeutic Skin Cream  NutraDerm Therapeutic Skin Lotion  ProShield Protective Hand Cream  Provon moisturizing lotion   Incentive Spirometer  An incentive spirometer is a tool that can help keep your lungs clear and active. This tool measures how well you are filling your lungs with each breath. Taking long deep breaths may help reverse or decrease the chance of developing breathing (pulmonary) problems (especially infection) following: A long period of time when you are unable to move or be active. BEFORE THE PROCEDURE  If the spirometer includes an indicator to show your best effort, your nurse or respiratory therapist will set it to a desired goal. If possible, sit up straight or lean slightly forward. Try not to slouch. Hold the incentive spirometer in an upright position. INSTRUCTIONS FOR USE  Sit on the edge of your bed if possible, or sit up as far as you can in bed or on a chair. Hold the incentive spirometer in an upright position. Breathe out normally. Place the mouthpiece in your mouth and seal your  lips tightly around it. Breathe in slowly and as deeply as possible, raising the piston or the ball toward the top of the column. Hold your breath for 3-5 seconds or for as long as possible. Allow the piston or ball to fall to the bottom of the column. Remove the mouthpiece from your mouth and breathe out normally. Rest for a few seconds and repeat Steps 1 through 7 at least 10 times every 1-2 hours when you are awake. Take your time and take a few normal breaths between deep breaths. The spirometer may include an indicator to show your best effort. Use the indicator as a goal to work toward during each repetition. After each set of 10 deep breaths, practice coughing to be sure your lungs are clear. If you have an incision (the cut made at the time of surgery), support your incision when coughing by placing a pillow or rolled up towels firmly against  it. Once you are able to get out of bed, walk around indoors and cough well. You may stop using the incentive spirometer when instructed by your caregiver.  RISKS AND COMPLICATIONS Take your time so you do not get dizzy or light-headed. If you are in pain, you may need to take or ask for pain medication before doing incentive spirometry. It is harder to take a deep breath if you are having pain. AFTER USE Rest and breathe slowly and easily. It can be helpful to keep track of a log of your progress. Your caregiver can provide you with a simple table to help with this. If you are using the spirometer at home, follow these instructions: SEEK MEDICAL CARE IF:  You are having difficultly using the spirometer. You have trouble using the spirometer as often as instructed. Your pain medication is not giving enough relief while using the spirometer. You develop fever of 100.5 F (38.1 C) or higher. SEEK IMMEDIATE MEDICAL CARE IF:  You cough up bloody sputum that had not been present before. You develop fever of 102 F (38.9 C) or greater. You develop worsening pain at or near the incision site. MAKE SURE YOU:  Understand these instructions. Will watch your condition. Will get help right away if you are not doing well or get worse. Document Released: 10/24/2006 Document Revised: 09/05/2011 Document Reviewed: 12/25/2006 ExitCare Patient Information 2014 [place] [employer].   ________________________________________________________________________  [facility]- Preparing for Total Shoulder Arthroplasty    Before surgery, you can play an important role. Because skin is not sterile, your skin needs to be as free of germs as possible. You can reduce the number of germs on your skin by using the following products. Benzoyl Peroxide Gel Reduces the number of germs present on the skin Applied twice a day to shoulder area starting two days before surgery     ==================================================================  Please follow these instructions carefully:  BENZOYL PEROXIDE 5% GEL  Please do not use if you have an allergy to benzoyl peroxide.   If your skin becomes reddened/irritated stop using the benzoyl peroxide.  Starting two days before surgery, apply as follows: Apply benzoyl peroxide in the morning and at night. Apply after taking a shower. If you are not taking a shower clean entire shoulder front, back, and side along with the armpit with a clean wet washcloth.  Place a quarter-sized dollop on your shoulder and rub in thoroughly, making sure to cover the front, back, and side of your shoulder, along with the armpit.   2 days before ____  AM   ____ PM              1 day before ____ AM   ____ PM                         Do this twice a day for two days.  (Last application is the night before surgery, AFTER using the CHG soap as described below).  Do NOT apply benzoyl peroxide gel on the day of surgery.

## 2023-02-01 NOTE — Progress Notes (Addendum)
COVID Vaccine Completed: yes  Date of COVID positive in last 90 days: no  PCP - Adrian Prince, MD Cardiologist - n/a  Chest x-ray - 08/15/22 Epic EKG - 08/11/22 Epic Stress Test - n/a ECHO - 11/27/15 Epic Cardiac Cath - n/a Pacemaker/ICD device last checked: n/a Spinal Cord Stimulator: n/a  Bowel Prep - no  Sleep Study - yes CPAP - yes every night   Fasting Blood Sugar - n/a Checks Blood Sugar _____ times a day  Last dose of GLP1 agonist-  Wegovy, takes Wednesdays GLP1 instructions:  last dose 02/08/23, do not take 02/15/23   Last dose of SGLT-2 inhibitors-  N/A SGLT-2 instructions: N/A   Blood Thinner Instructions:  Time Aspirin Instructions: ASA 81, stop 7 days  Last Dose:  Activity level: Can go up a flight of stairs and perform activities of daily living without stopping and without symptoms of chest pain or shortness of breath. Slow with stairs   Anesthesia review:   Patient denies shortness of breath, fever, cough and chest pain at PAT appointment  Patient verbalized understanding of instructions that were given to them at the PAT appointment. Patient was also instructed that they will need to review over the PAT instructions again at home before surgery.

## 2023-02-02 ENCOUNTER — Other Ambulatory Visit: Payer: Self-pay

## 2023-02-02 MED ORDER — MELOXICAM 15 MG PO TABS: ORAL_TABLET | ORAL | 2 refills | Status: DC

## 2023-02-02 NOTE — Progress Notes (Signed)
Pt called asking for refill on Meloxicam.  Refill sent to pharmacy.

## 2023-02-06 ENCOUNTER — Encounter (HOSPITAL_COMMUNITY)
Admission: RE | Admit: 2023-02-06 | Discharge: 2023-02-06 | Disposition: A | Discharge status: Home / Self Care | Payer: Medicare PPO | Source: Ambulatory Visit | Attending: Orthopedic Surgery | Admitting: Orthopedic Surgery

## 2023-02-06 ENCOUNTER — Other Ambulatory Visit: Payer: Self-pay

## 2023-02-06 ENCOUNTER — Encounter (HOSPITAL_COMMUNITY): Payer: Self-pay

## 2023-02-06 VITALS — BP 127/73 | HR 78 | Temp 98.9°F | Resp 16 | Ht 68.5 in | Wt 245.0 lb

## 2023-02-06 DIAGNOSIS — Z01812 Encounter for preprocedural laboratory examination: Secondary | ICD-10-CM | POA: Diagnosis not present

## 2023-02-06 DIAGNOSIS — Z01818 Encounter for other preprocedural examination: Secondary | ICD-10-CM | POA: Diagnosis present

## 2023-02-06 DIAGNOSIS — I10 Essential (primary) hypertension: Secondary | ICD-10-CM | POA: Insufficient documentation

## 2023-02-06 DIAGNOSIS — E291 Testicular hypofunction: Secondary | ICD-10-CM | POA: Diagnosis not present

## 2023-02-06 DIAGNOSIS — D519 Vitamin B12 deficiency anemia, unspecified: Secondary | ICD-10-CM | POA: Diagnosis not present

## 2023-02-06 LAB — CBC
HCT: 39.7 % (ref 39.0–52.0)
Hemoglobin: 13.2 g/dL (ref 13.0–17.0)
MCH: 32.4 pg (ref 26.0–34.0)
MCHC: 33.2 g/dL (ref 30.0–36.0)
MCV: 97.5 fL (ref 80.0–100.0)
Platelets: 157 10*3/uL (ref 150–400)
RBC: 4.07 MIL/uL — ABNORMAL LOW (ref 4.22–5.81)
RDW: 13.2 % (ref 11.5–15.5)
WBC: 6.7 10*3/uL (ref 4.0–10.5)
nRBC: 0 % (ref 0.0–0.2)

## 2023-02-06 LAB — BASIC METABOLIC PANEL
Anion gap: 10 (ref 5–15)
BUN: 10 mg/dL (ref 8–23)
CO2: 28 mmol/L (ref 22–32)
Calcium: 9.3 mg/dL (ref 8.9–10.3)
Chloride: 103 mmol/L (ref 98–111)
Creatinine, Ser: 0.8 mg/dL (ref 0.61–1.24)
GFR, Estimated: >60 mL/min (ref >60)
Glucose, Bld: 105 mg/dL — ABNORMAL HIGH (ref 70–99)
Potassium: 3.8 mmol/L (ref 3.5–5.1)
Sodium: 141 mmol/L (ref 135–145)

## 2023-02-06 LAB — SURGICAL PCR SCREEN
MRSA, PCR: NEGATIVE
Staphylococcus aureus: NEGATIVE

## 2023-02-08 NOTE — Care Plan (Signed)
Ortho Bundle Case Management Note  Patient Details  Name: Albert Barr MRN: [id number] Date of Birth: January 11, 1941  spoke with patient. he will discharge to home with family to assist. no DME or therapy needs at this time. discharge instructions discussed and mailed to patient. states no further questions at this time   DME Arranged:    DME Agency:     HH Arranged:    HH Agency:     Additional Comments: Please contact me with any questions of if this plan should need to change.  Shauna Hugh,  RN,BSN,MHA,CCM  [facility]opaedic Specialist  [phone] 02/08/2023, 6:07 PM

## 2023-02-15 DIAGNOSIS — L218 Other seborrheic dermatitis: Secondary | ICD-10-CM | POA: Diagnosis not present

## 2023-02-15 DIAGNOSIS — D225 Melanocytic nevi of trunk: Secondary | ICD-10-CM | POA: Diagnosis not present

## 2023-02-15 DIAGNOSIS — L821 Other seborrheic keratosis: Secondary | ICD-10-CM | POA: Diagnosis not present

## 2023-02-15 DIAGNOSIS — D485 Neoplasm of uncertain behavior of skin: Secondary | ICD-10-CM | POA: Diagnosis not present

## 2023-02-15 DIAGNOSIS — Z85828 Personal history of other malignant neoplasm of skin: Secondary | ICD-10-CM | POA: Diagnosis not present

## 2023-02-15 DIAGNOSIS — D692 Other nonthrombocytopenic purpura: Secondary | ICD-10-CM | POA: Diagnosis not present

## 2023-02-16 ENCOUNTER — Other Ambulatory Visit: Payer: Self-pay

## 2023-02-16 ENCOUNTER — Ambulatory Visit (HOSPITAL_COMMUNITY): Payer: Medicare PPO | Admitting: Anesthesiology

## 2023-02-16 ENCOUNTER — Encounter (HOSPITAL_COMMUNITY): Payer: Self-pay | Admitting: Orthopedic Surgery

## 2023-02-16 ENCOUNTER — Ambulatory Visit (HOSPITAL_COMMUNITY)
Admission: RE | Admit: 2023-02-16 | Discharge: 2023-02-16 | Disposition: A | Discharge status: Home / Self Care | Payer: Medicare PPO | Attending: Orthopedic Surgery | Admitting: Orthopedic Surgery

## 2023-02-16 ENCOUNTER — Encounter (HOSPITAL_COMMUNITY)
Admission: RE | Disposition: A | Discharge status: Home / Self Care | Payer: Self-pay | Source: Home / Self Care | Attending: Orthopedic Surgery

## 2023-02-16 ENCOUNTER — Ambulatory Visit (HOSPITAL_BASED_OUTPATIENT_CLINIC_OR_DEPARTMENT_OTHER): Payer: Medicare PPO | Admitting: Anesthesiology

## 2023-02-16 DIAGNOSIS — Z87891 Personal history of nicotine dependence: Secondary | ICD-10-CM

## 2023-02-16 DIAGNOSIS — K219 Gastro-esophageal reflux disease without esophagitis: Secondary | ICD-10-CM | POA: Diagnosis not present

## 2023-02-16 DIAGNOSIS — G4733 Obstructive sleep apnea (adult) (pediatric): Secondary | ICD-10-CM | POA: Insufficient documentation

## 2023-02-16 DIAGNOSIS — M19012 Primary osteoarthritis, left shoulder: Secondary | ICD-10-CM | POA: Insufficient documentation

## 2023-02-16 DIAGNOSIS — Z96612 Presence of left artificial shoulder joint: Secondary | ICD-10-CM | POA: Diagnosis not present

## 2023-02-16 DIAGNOSIS — I10 Essential (primary) hypertension: Secondary | ICD-10-CM | POA: Insufficient documentation

## 2023-02-16 DIAGNOSIS — G8918 Other acute postprocedural pain: Secondary | ICD-10-CM | POA: Diagnosis not present

## 2023-02-16 DIAGNOSIS — M25712 Osteophyte, left shoulder: Secondary | ICD-10-CM | POA: Insufficient documentation

## 2023-02-16 DIAGNOSIS — E785 Hyperlipidemia, unspecified: Secondary | ICD-10-CM | POA: Diagnosis not present

## 2023-02-16 HISTORY — PX: REVERSE SHOULDER ARTHROPLASTY: SHX5054

## 2023-02-16 SURGERY — ARTHROPLASTY, SHOULDER, TOTAL, REVERSE
Anesthesia: Regional | Site: Shoulder | Laterality: Left

## 2023-02-16 MED ORDER — ONDANSETRON HCL 4 MG/2ML IJ SOLN
INTRAMUSCULAR | Status: AC
  Filled 2023-02-16: qty 2

## 2023-02-16 MED ORDER — PHENYLEPHRINE HCL-NACL 20-0.9 MG/250ML-% IV SOLN
INTRAVENOUS | Status: DC | PRN
Start: 2023-02-16 — End: 2023-02-16
  Administered 2023-02-16: 30 ug/min via INTRAVENOUS

## 2023-02-16 MED ORDER — PROPOFOL 10 MG/ML IV BOLUS
INTRAVENOUS | Status: DC | PRN
  Administered 2023-02-16: 150 mg via INTRAVENOUS

## 2023-02-16 MED ORDER — DEXAMETHASONE SODIUM PHOSPHATE 10 MG/ML IJ SOLN
INTRAMUSCULAR | Status: DC | PRN
Start: 2023-02-16 — End: 2023-02-16
  Administered 2023-02-16: 8 mg via INTRAVENOUS

## 2023-02-16 MED ORDER — PHENYLEPHRINE HCL-NACL 20-0.9 MG/250ML-% IV SOLN
INTRAVENOUS | Status: AC
  Filled 2023-02-16: qty 250

## 2023-02-16 MED ORDER — CEFAZOLIN SODIUM-DEXTROSE 2-4 GM/100ML-% IV SOLN
2.0000 g | INTRAVENOUS | Status: AC
  Administered 2023-02-16: 2 g via INTRAVENOUS
  Filled 2023-02-16: qty 100

## 2023-02-16 MED ORDER — HYDROMORPHONE HCL 1 MG/ML IJ SOLN
INTRAMUSCULAR | Status: AC
  Filled 2023-02-16: qty 1

## 2023-02-16 MED ORDER — ORAL CARE MOUTH RINSE: 15.0000 mL | Freq: Once | OROMUCOSAL | Status: AC

## 2023-02-16 MED ORDER — OXYCODONE HCL 5 MG/5ML PO SOLN: 5.0000 mg | Freq: Once | ORAL | Status: AC | PRN

## 2023-02-16 MED ORDER — BUPIVACAINE LIPOSOME 1.3 % IJ SUSP
INTRAMUSCULAR | Status: DC | PRN
  Administered 2023-02-16: 10 mL via PERINEURAL

## 2023-02-16 MED ORDER — OXYCODONE HCL 5 MG PO TABS
ORAL_TABLET | ORAL | Status: AC
  Filled 2023-02-16: qty 1

## 2023-02-16 MED ORDER — ONDANSETRON HCL 4 MG/2ML IJ SOLN
INTRAMUSCULAR | Status: DC | PRN
  Administered 2023-02-16: 4 mg via INTRAVENOUS

## 2023-02-16 MED ORDER — PROPOFOL 10 MG/ML IV BOLUS
INTRAVENOUS | Status: AC
  Filled 2023-02-16: qty 20

## 2023-02-16 MED ORDER — SUGAMMADEX SODIUM 200 MG/2ML IV SOLN
INTRAVENOUS | Status: DC | PRN
  Administered 2023-02-16: 200 mg via INTRAVENOUS

## 2023-02-16 MED ORDER — WATER FOR IRRIGATION, STERILE IR SOLN
Status: DC | PRN
  Administered 2023-02-16: 2000 mL

## 2023-02-16 MED ORDER — METHOCARBAMOL 500 MG IVPB - SIMPLE MED
INTRAVENOUS | Status: AC
  Filled 2023-02-16: qty 55

## 2023-02-16 MED ORDER — TRANEXAMIC ACID-NACL 1000-0.7 MG/100ML-% IV SOLN
1000.0000 mg | INTRAVENOUS | Status: AC
  Administered 2023-02-16: 1000 mg via INTRAVENOUS
  Filled 2023-02-16: qty 100

## 2023-02-16 MED ORDER — PROMETHAZINE HCL 25 MG/ML IJ SOLN: 6.2500 mg | INTRAMUSCULAR | Status: DC | PRN

## 2023-02-16 MED ORDER — METHOCARBAMOL 500 MG IVPB - SIMPLE MED
500.0000 mg | Freq: Four times a day (QID) | INTRAVENOUS | Status: DC | PRN
  Administered 2023-02-16: 500 mg via INTRAVENOUS

## 2023-02-16 MED ORDER — HYDROMORPHONE HCL 1 MG/ML IJ SOLN
0.2500 mg | INTRAMUSCULAR | Status: DC | PRN
  Administered 2023-02-16: 0.5 mg via INTRAVENOUS
  Administered 2023-02-16 (×2): 0.25 mg via INTRAVENOUS

## 2023-02-16 MED ORDER — TIZANIDINE HCL 4 MG PO TABS
4.0000 mg | ORAL_TABLET | Freq: Three times a day (TID) | ORAL | 0 refills | Status: DC | PRN
Start: 2023-02-16 — End: 2023-04-04

## 2023-02-16 MED ORDER — METHOCARBAMOL 500 MG PO TABS: 500.0000 mg | ORAL_TABLET | Freq: Four times a day (QID) | ORAL | Status: DC | PRN

## 2023-02-16 MED ORDER — OXYCODONE HCL 5 MG PO TABS
5.0000 mg | ORAL_TABLET | Freq: Once | ORAL | Status: AC | PRN
  Administered 2023-02-16: 5 mg via ORAL

## 2023-02-16 MED ORDER — AMISULPRIDE (ANTIEMETIC) 5 MG/2ML IV SOLN: 10.0000 mg | Freq: Once | INTRAVENOUS | Status: DC | PRN

## 2023-02-16 MED ORDER — 0.9 % SODIUM CHLORIDE (POUR BTL) OPTIME
TOPICAL | Status: DC | PRN
  Administered 2023-02-16: 1000 mL

## 2023-02-16 MED ORDER — LIDOCAINE HCL (CARDIAC) PF 100 MG/5ML IV SOSY
PREFILLED_SYRINGE | INTRAVENOUS | Status: DC | PRN
  Administered 2023-02-16: 60 mg via INTRAVENOUS

## 2023-02-16 MED ORDER — BUPIVACAINE HCL (PF) 0.5 % IJ SOLN
INTRAMUSCULAR | Status: DC | PRN
  Administered 2023-02-16: 20 mL via PERINEURAL

## 2023-02-16 MED ORDER — LACTATED RINGERS IV SOLN: INTRAVENOUS | Status: DC

## 2023-02-16 MED ORDER — LIDOCAINE HCL (PF) 2 % IJ SOLN
INTRAMUSCULAR | Status: AC
  Filled 2023-02-16: qty 5

## 2023-02-16 MED ORDER — CHLORHEXIDINE GLUCONATE 0.12 % MT SOLN
15.0000 mL | Freq: Once | OROMUCOSAL | Status: AC
  Administered 2023-02-16: 15 mL via OROMUCOSAL

## 2023-02-16 MED ORDER — SODIUM CHLORIDE 0.9 % IR SOLN
Status: DC | PRN
  Administered 2023-02-16: 1000 mL

## 2023-02-16 MED ORDER — OXYCODONE HCL 5 MG PO TABS: 5.0000 mg | ORAL_TABLET | ORAL | 0 refills | Status: DC | PRN

## 2023-02-16 MED ORDER — FENTANYL CITRATE (PF) 100 MCG/2ML IJ SOLN
INTRAMUSCULAR | Status: DC | PRN
  Administered 2023-02-16 (×2): 50 ug via INTRAVENOUS

## 2023-02-16 MED ORDER — DEXAMETHASONE SODIUM PHOSPHATE 10 MG/ML IJ SOLN
INTRAMUSCULAR | Status: AC
  Filled 2023-02-16: qty 1

## 2023-02-16 MED ORDER — ROCURONIUM BROMIDE 100 MG/10ML IV SOLN
INTRAVENOUS | Status: DC | PRN
  Administered 2023-02-16: 100 mg via INTRAVENOUS

## 2023-02-16 MED ORDER — FENTANYL CITRATE (PF) 100 MCG/2ML IJ SOLN
INTRAMUSCULAR | Status: AC
  Filled 2023-02-16: qty 2

## 2023-02-16 MED ORDER — ROCURONIUM BROMIDE 10 MG/ML (PF) SYRINGE
PREFILLED_SYRINGE | INTRAVENOUS | Status: AC
  Filled 2023-02-16: qty 10

## 2023-02-16 SURGICAL SUPPLY — 81 items
AID PSTN UNV HD RSTRNT DISP (MISCELLANEOUS) ×1
APL SKNCLS STERI-STRIP NONHPOA (GAUZE/BANDAGES/DRESSINGS) ×1
BAG COUNTER SPONGE SURGICOUNT (BAG) IMPLANT
BAG SPEC THK2 15X12 ZIP CLS (MISCELLANEOUS) ×1
BAG SPNG CNTER NS LX DISP (BAG)
BAG ZIPLOCK 12X15 (MISCELLANEOUS) ×1 IMPLANT
BASEPLATE P2 COATD GLND 6.5X30 (Shoulder) IMPLANT
BENZOIN TINCTURE PRP APPL 2/3 (GAUZE/BANDAGES/DRESSINGS) IMPLANT
BIT DRILL 1.6MX128 (BIT) IMPLANT
BIT DRILL 2.5 DIA 127 CALI (BIT) IMPLANT
BIT DRILL 4 DIA CALIBRATED (BIT) IMPLANT
BLADE SAW SGTL 73X25 THK (BLADE) ×1 IMPLANT
BOOTIES KNEE HIGH SLOAN (MISCELLANEOUS) ×2 IMPLANT
BSPLAT GLND 30 STRL LF SHLDR (Shoulder) ×1 IMPLANT
COOLER ICEMAN CLASSIC (MISCELLANEOUS) IMPLANT
COVER BACK TABLE 60X90IN (DRAPES) ×1 IMPLANT
COVER SURGICAL LIGHT HANDLE (MISCELLANEOUS) ×1 IMPLANT
DRAPE INCISE IOBAN 66X45 STRL (DRAPES) ×1 IMPLANT
DRAPE ORTHO SPLIT 77X108 STRL (DRAPES) ×2
DRAPE POUCH INSTRU U-SHP 10X18 (DRAPES) ×1 IMPLANT
DRAPE SHEET LG 3/4 BI-LAMINATE (DRAPES) ×1 IMPLANT
DRAPE SURG 17X11 SM STRL (DRAPES) ×1 IMPLANT
DRAPE SURG ORHT 6 SPLT 77X108 (DRAPES) ×2 IMPLANT
DRAPE TOP 10253 STERILE (DRAPES) ×1 IMPLANT
DRAPE U-SHAPE 47X51 STRL (DRAPES) ×1 IMPLANT
DRSG AQUACEL AG ADV 3.5X 6 (GAUZE/BANDAGES/DRESSINGS) ×1 IMPLANT
DURAPREP 26ML APPLICATOR (WOUND CARE) ×2 IMPLANT
ELECT BLADE TIP CTD 4 INCH (ELECTRODE) ×1 IMPLANT
ELECT REM PT RETURN 15FT ADLT (MISCELLANEOUS) ×1 IMPLANT
FACESHIELD WRAPAROUND (MASK) ×1 IMPLANT
FACESHIELD WRAPAROUND OR TEAM (MASK) ×1 IMPLANT
GLOVE BIO SURGEON STRL SZ7.5 (GLOVE) ×1 IMPLANT
GLOVE BIOGEL PI IND STRL 6.5 (GLOVE) ×1 IMPLANT
GLOVE BIOGEL PI IND STRL 8 (GLOVE) ×1 IMPLANT
GLOVE SURG SS PI 6.5 STRL IVOR (GLOVE) ×1 IMPLANT
GOWN STRL REUS W/ TWL LRG LVL3 (GOWN DISPOSABLE) ×1 IMPLANT
GOWN STRL REUS W/ TWL XL LVL3 (GOWN DISPOSABLE) ×1 IMPLANT
GOWN STRL REUS W/TWL LRG LVL3 (GOWN DISPOSABLE) ×1
GOWN STRL REUS W/TWL XL LVL3 (GOWN DISPOSABLE) ×1
HANDPIECE INTERPULSE COAX TIP (DISPOSABLE) ×1
HEAD GLENOID W/SCREW 32MM (Shoulder) IMPLANT
HOOD PEEL AWAY T7 (MISCELLANEOUS) ×3 IMPLANT
INSERT EPOLY STND HUMERUS 36MM (Shoulder) ×1 IMPLANT
INSERT EPOLYSTD HUMERUS 36MM (Shoulder) IMPLANT
KIT BASIN OR (CUSTOM PROCEDURE TRAY) ×1 IMPLANT
KIT TURNOVER KIT A (KITS) IMPLANT
MANIFOLD NEPTUNE II (INSTRUMENTS) ×1 IMPLANT
NDL TROCAR POINT SZ 2 1/2 (NEEDLE) IMPLANT
NEEDLE TROCAR POINT SZ 2 1/2 (NEEDLE) IMPLANT
NS IRRIG 1000ML POUR BTL (IV SOLUTION) ×1 IMPLANT
P2 COATDE GLNOID BSEPLT 6.5X30 (Shoulder) ×1 IMPLANT
PACK SHOULDER (CUSTOM PROCEDURE TRAY) ×1 IMPLANT
PAD COLD SHLDR WRAP-ON (PAD) IMPLANT
PROTECTOR NERVE ULNAR (MISCELLANEOUS) IMPLANT
RESTRAINT HEAD UNIVERSAL NS (MISCELLANEOUS) ×1 IMPLANT
RETRIEVER SUT HEWSON (MISCELLANEOUS) IMPLANT
SCREW BONE LOCKING RSP 5.0X14 (Screw) ×1 IMPLANT
SCREW BONE LOCKING RSP 5.0X30 (Screw) ×2 IMPLANT
SCREW BONE RSP LOCK 5X14 (Screw) IMPLANT
SCREW BONE RSP LOCK 5X22 (Screw) IMPLANT
SCREW BONE RSP LOCK 5X30 (Screw) IMPLANT
SCREW BONE RSP LOCKING 5.0X32 (Screw) ×1 IMPLANT
SET HNDPC FAN SPRY TIP SCT (DISPOSABLE) ×1 IMPLANT
SLING ARM FOAM STRAP LRG (SOFTGOODS) IMPLANT
SLING ARM IMMOBILIZER LRG (SOFTGOODS) IMPLANT
SLING ARM IMMOBILIZER MED (SOFTGOODS) IMPLANT
STEM HUMERAL STD SHELL 14X48 (Miscellaneous) IMPLANT
STRIP CLOSURE SKIN 1/2X4 (GAUZE/BANDAGES/DRESSINGS) ×2 IMPLANT
SUCTION TUBE FRAZIER 10FR DISP (SUCTIONS) IMPLANT
SUPPORT WRAP ARM LG (MISCELLANEOUS) ×1 IMPLANT
SUT ETHIBOND 2 V 37 (SUTURE) IMPLANT
SUT FIBERWIRE #2 38 REV NDL BL (SUTURE)
SUT MNCRL AB 4-0 PS2 18 (SUTURE) ×1 IMPLANT
SUT VIC AB 2-0 CT1 27 (SUTURE) ×2
SUT VIC AB 2-0 CT1 TAPERPNT 27 (SUTURE) ×2 IMPLANT
SUTURE FIBERWR#2 38 REV NDL BL (SUTURE) IMPLANT
TAPE LABRALWHITE 1.5X36 (TAPE) IMPLANT
TAPE SUT LABRALTAP WHT/BLK (SUTURE) IMPLANT
TOWEL OR 17X26 10 PK STRL BLUE (TOWEL DISPOSABLE) ×1 IMPLANT
TOWEL OR NON WOVEN STRL DISP B (DISPOSABLE) ×1 IMPLANT
WATER STERILE IRR 1000ML POUR (IV SOLUTION) ×1 IMPLANT

## 2023-02-16 NOTE — Discharge Instructions (Addendum)
Discharge Instructions after Reverse Total Shoulder Arthroplasty   A sling has been provided for you. You are to wear this at all times (except for bathing and dressing), until your first post operative visit with Dr. Ave Filter. Please also wear while sleeping at night. While you bath and dress, let the arm/elbow extend straight down to stretch your elbow. Wiggle your fingers and pump your first while your in the sling to prevent hand swelling. Use ice on the shoulder intermittently over the first 48 hours after surgery. Continue to use ice or and ice machine as needed after 48 hours for pain control/swelling.  Pain medicine has been prescribed for you.  Use your medicine liberally over the first 48 hours, and then you can begin to taper your use. You may take Extra Strength Tylenol or Tylenol only in place of the pain pills. DO NOT take ANY nonsteroidal anti-inflammatory pain medications: Advil, Motrin, Ibuprofen, Aleve, Naproxen or Naprosyn.  Take one aspirin 81mg  a day for 2 weeks after surgery, starting day after surgery. No driving Leave your dressing on until your first follow up visit.  You may shower with the dressing.  Hold your arm as if you still have your sling on while you shower. Simply allow the water to wash over the site and then pat dry. Make sure your axilla (armpit) is completely dry after showering.    Please call [phone] during normal business hours or [phone] after hours for any problems. Including the following:  - excessive redness of the incisions - drainage for more than 4 days - fever of more than 101.5 F  *Please note that pain medications will not be refilled after hours or on weekends.

## 2023-02-16 NOTE — Anesthesia Procedure Notes (Signed)
Procedure Name: Intubation Date/Time: 02/16/2023 7:41 AM  Performed by: Johnette Abraham, CRNAPre-anesthesia Checklist: Patient identified, Emergency Drugs available, Suction available and Patient being monitored Patient Re-evaluated:Patient Re-evaluated prior to induction Oxygen Delivery Method: Circle System Utilized Preoxygenation: Pre-oxygenation with 100% oxygen Induction Type: IV induction Ventilation: Mask ventilation without difficulty Laryngoscope Size: Glidescope and 4 Grade View: Grade III Tube type: Oral Tube size: 8.0 mm Number of attempts: 3 Airway Equipment and Method: Stylet and Oral airway Placement Confirmation: ETT inserted through vocal cords under direct vision, positive ETCO2 and breath sounds checked- equal and bilateral Secured at: 24 cm Tube secured with: Tape Dental Injury: Teeth and Oropharynx as per pre-operative assessment

## 2023-02-16 NOTE — Anesthesia Postprocedure Evaluation (Signed)
Anesthesia Post Note  Patient: Albert Barr  Procedure(s) Performed: REVERSE SHOULDER ARTHROPLASTY (Left: Shoulder)     Patient location during evaluation: PACU Anesthesia Type: Regional and General Level of consciousness: awake and alert Pain management: pain level controlled Vital Signs Assessment: post-procedure vital signs reviewed and stable Respiratory status: spontaneous breathing, nonlabored ventilation and respiratory function stable Cardiovascular status: blood pressure returned to baseline and stable Postop Assessment: no apparent nausea or vomiting Anesthetic complications: no   No notable events documented.  Last Vitals:  Vitals:   02/16/23 1000 02/16/23 1015  BP: 130/77 133/81  Pulse: 63 (!) 59  Resp: 18 12  Temp:    SpO2: 93% 93%    Last Pain:  Vitals:   02/16/23 1015  TempSrc:   PainSc: 3                  Lowella Curb

## 2023-02-16 NOTE — H&P (Signed)
Albert Barr is an 82 y.o. male.   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  Pain interferes with sleep and quality of life.   Past Medical History:  Diagnosis Date   Allergy    Anal fissure    Arthritis    shoulder, knees   Asthma    Atrophic gastritis without mention of hemorrhage    Cataract    bilateraly   Esophageal reflux    Family history of gastric cancer    Fibula fracture    hair line fracture   Gastric polyps    history   Hiatal hernia    Hx of adenomatous colonic polyps    Hyperlipidemia    Hypertension    Metaplasia of esophagus 2011   "gastric metaplasia"   Morbid obesity (HCC)    Obstructive sleep apnea on CPAP    Other voice and resonance disorders    Peripheral neuropathy    Polyposis syndrome gastric and colonic - attenuated 12/09/2015   Sleep apnea    uses CPAP   Squamous cell skin cancer 04/2018   lesion left forearm   Unspecified asthma(493.90)     Past Surgical History:  Procedure Laterality Date   APPENDECTOMY     CHOLECYSTECTOMY     COLONOSCOPY  08/20/2009 (multiple)   Tubular adenoma   ESOPHAGOGASTRODUODENOSCOPY  08/20/09 (multiple)   EYE SURGERY Bilateral 2023   cataract surgery   INCISION AND DRAINAGE PERIRECTAL ABSCESS  06/27/2005   KNEE SURGERY     arthroscopy- both knees   POLYPECTOMY     REVERSE SHOULDER ARTHROPLASTY Right 08/18/2022   Procedure: REVERSE SHOULDER ARTHROPLASTY;  Surgeon: Jones Broom, MD;  Location: [facility]S;  Service: Orthopedics;  Laterality: Right;   UPPER GASTROINTESTINAL ENDOSCOPY      Family History  Problem Relation Age of Onset   Stomach cancer Mother 58   Cancer Sister        bladder   Sleep apnea Son    Colon cancer Neg Hx    Esophageal cancer Neg Hx    Rectal cancer Neg Hx    Social History:  reports that he quit smoking about 49 years ago. His smoking use  included cigarettes. He started smoking about 59 years ago. He has a 20 pack-year smoking history. He has never used smokeless tobacco. He reports current alcohol use of about 7.0 standard drinks of alcohol per week. He reports that he does not use drugs.  Allergies:  Allergies  Allergen Reactions   Beclomethasone Rash   Moxifloxacin Diarrhea    Medications Prior to Admission  Medication Sig Dispense Refill   ADVAIR DISKUS 250-50 MCG/DOSE AEPB Inhale 1 puff into the lungs 2 (two) times daily.     AMBULATORY NON FORMULARY MEDICATION Bing Neighbors 2 1/2   Foot drop, bilateral  Codes: M21.371, M21.372 1 Device 1   ascorbic acid (VITAMIN C) 500 MG tablet Take 500 mg by mouth daily.     aspirin 81 MG tablet Take 81 mg by mouth daily.     Cyanocobalamin (B-12) 1000 MCG TABS Take 1,000 Units by mouth daily.     cycloSPORINE (RESTASIS) 0.05 % ophthalmic emulsion Place 1 drop into both eyes 2 (two) times daily.     ezetimibe (ZETIA) 10 MG tablet Take 10 mg by mouth daily.     fish oil-omega-3 fatty acids 1000 MG capsule Take 1 g by mouth  in the morning and at bedtime.  300 mg /1000 mg fatty acid     folic acid (FOLVITE) 1 MG tablet Take 1 mg by mouth daily.     gabapentin (NEURONTIN) 300 MG capsule Take 3 capsules (900 mg total) by mouth 2 (two) times daily. (Patient taking differently: Take 600 mg by mouth 2 (two) times daily.) 180 capsule 2   hydrochlorothiazide (HYDRODIURIL) 25 MG tablet TAKE 1/2 TABLET BY MOUTH EVERY DAY 60 tablet 0   iron polysaccharides (NIFEREX) 150 MG capsule Take 150 mg by mouth daily.     loratadine (CLARITIN) 10 MG tablet Take 10 mg by mouth daily as needed for allergies.     meloxicam (MOBIC) 15 MG tablet TAKE 1 TABLET(15 MG) BY MOUTH DAILY AS NEEDED FOR PAIN 30 tablet 2   minocycline (MINOCIN) 50 MG capsule Take 50 mg by mouth daily as needed (Rosacea).     NON FORMULARY Pt uses a cpap nightly     omeprazole (PRILOSEC) 40 MG capsule Take 40 mg by mouth daily.      Polyethyl Glycol-Propyl Glycol (SYSTANE ULTRA) 0.4-0.3 % SOLN Place 1-2 drops into both eyes daily as needed (Dry eyes).     pyridOXINE (VITAMIN B6) 25 MG tablet Take 25 mg by mouth daily.     rosuvastatin (CRESTOR) 20 MG tablet Take 20 mg by mouth daily.     Semaglutide-Weight Management (WEGOVY) 2.4 MG/0.75ML SOAJ Inject 2.4 mg into the skin every Wednesday.     Vibegron (GEMTESA) 75 MG TABS Take 75 mg by mouth daily.     Vitamin D, Ergocalciferol, (DRISDOL) 1.25 MG (50000 UNIT) CAPS capsule Take 50,000 Units by mouth once a week.     zafirlukast (ACCOLATE) 20 MG tablet Take 20 mg by mouth 2 (two) times daily.     albuterol (PROVENTIL HFA;VENTOLIN HFA) 108 (90 BASE) MCG/ACT inhaler Inhale 2 puffs into the lungs every 6 (six) hours as needed for wheezing or shortness of breath.     ANDROGEL PUMP 20.25 MG/ACT (1.62%) GEL Apply 2 Pump topically 2 (two) times daily.      No results found for this or any previous visit (from the past 48 hour(s)). No results found.  Review of Systems  All other systems reviewed and are negative.   Blood pressure 124/72, pulse 67, temperature 98.6 F (37 C), temperature source Oral, resp. rate 11, height 5' 8.5" (1.74 m), weight 111.1 kg, SpO2 99%. Physical Exam Constitutional:      Appearance: He is well-developed.  HENT:     Head: Atraumatic.  Eyes:     Extraocular Movements: Extraocular movements intact.  Cardiovascular:     Pulses: Normal pulses.  Pulmonary:     Effort: Pulmonary effort is normal.  Musculoskeletal:     Comments: L shoulder pain with limited ROM. NVID.  Skin:    General: Skin is warm and dry.  Neurological:     Mental Status: He is alert and oriented to person, place, and time.  Psychiatric:        Mood and Affect: Mood normal.      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  Pain interferes with sleep and quality of  life. Plan L reverse TSA Risks / benefits of surgery discussed Consent on chart  NPO for OR Preop antibiotics   Glennon Hamilton, MD 02/16/2023, 6:52 AM

## 2023-02-16 NOTE — Anesthesia Preprocedure Evaluation (Addendum)
Anesthesia Evaluation  Patient identified by MRN, date of birth, ID band Patient awake    Reviewed: Allergy & Precautions, NPO status , Patient's Chart, lab work & pertinent test results  Airway Mallampati: II  TM Distance: >3 FB Neck ROM: Full    Dental no notable dental hx. (+) Implants, Dental Advisory Given   Pulmonary asthma , sleep apnea , former smoker   Pulmonary exam normal breath sounds clear to auscultation       Cardiovascular hypertension, Pt. on medications Normal cardiovascular exam Rhythm:Regular Rate:Normal     Neuro/Psych  Neuromuscular disease    GI/Hepatic hiatal hernia,GERD  ,,  Endo/Other    Renal/GU Lab Results      Component                Value               Date                      CREATININE               0.75                08/11/2022                BUN                      7 (L)               08/11/2022                NA                       142                 08/11/2022                K                        3.6                 08/11/2022                     Musculoskeletal  (+) Arthritis , Osteoarthritis,    Abdominal  (+) + obese  Peds  Hematology Lab Results      Component                Value               Date                              HGB                      14.0                08/11/2022                HCT                      41.6                08/11/2022                      PLT  152                 08/11/2022              Anesthesia Other Findings All: moxifloxacin, beclamethasone  Reproductive/Obstetrics                             Anesthesia Physical Anesthesia Plan  ASA: 3  Anesthesia Plan: Regional and General   Post-op Pain Management: Regional block* and Minimal or no pain anticipated   Induction: Intravenous  PONV Risk Score and Plan: 2 and Treatment may vary due to age or medical condition, Ondansetron  and Midazolam  Airway Management Planned: Oral ETT  Additional Equipment: None  Intra-op Plan:   Post-operative Plan: Extubation in OR  Informed Consent:      Dental advisory given  Plan Discussed with: CRNA and Surgeon  Anesthesia Plan Comments:         Anesthesia Quick Evaluation

## 2023-02-16 NOTE — Evaluation (Signed)
Occupational Therapy Evaluation Patient Details Name: Albert Barr MRN: [id number] DOB: 05/06/1941 Today's Date: 02/16/2023   History of Present Illness 82 y.o. male with endstage left shoulder arthritis with severe rotator cuff disease and significant loss of motion. Pain and dysfunction interfered with quality of life and nonoperative treatment with activity modification, NSAIDS and injections failed.  Pt now s/p  REVERSE L SHOULDER ARTHROPLASTY   Clinical Impression   Pt required min A to don/doff shoulder immobilizer with education provided to maintain sling all of the time with the exception of bathing, dressing and when completing exercises until the surgeon states otherwise. L hand, wrist and elbow AAROM exercises completed with handouts and HEP provided, see details below. Pt was educated on how to complete UB ADLs while maintaining shoulder ROM restrictions with compensatory techniques, pt needs min A to don shirt and complete UB bathing. Overall pt also needed mod A for LB ADLs and CGA/Sup for safety for functional mobility with SPC. Shoulder protocol educational hand out provided to encourage carry-over at discharge. Pt will benefit from acute OT with plan to follow the surgeons follow up therapy plan.        If plan is discharge home, recommend the following: A little help with walking and/or transfers;A lot of help with bathing/dressing/bathroom;Assist for transportation    Functional Status Assessment  Patient has had a recent decline in their functional status and demonstrates the ability to make significant improvements in function in a reasonable and predictable amount of time.  Equipment Recommendations  None recommended by OT    Recommendations for Other Services       Precautions / Restrictions Precautions Precautions: Shoulder Shoulder Interventions: Shoulder sling/immobilizer Precaution Booklet Issued: Yes (comment) Precaution Comments: shoulder protocol, sling  wear, ice machine and L hand/wrist/elbow esecercies as allowed per surgeon at this time Restrictions Weight Bearing Restrictions: Yes LUE Weight Bearing: Non weight bearing      Mobility Bed Mobility               General bed mobility comments: pt in chair in PACU    Transfers Overall transfer level: Needs assistance Equipment used: Straight cane Transfers: Sit to/from Stand, Bed to chair/wheelchair/BSC Sit to Stand: Contact guard assist           General transfer comment: CGA for safety      Balance Overall balance assessment: Mild deficits observed, not formally tested                                         ADL either performed or assessed with clinical judgement   ADL Overall ADL's : Needs assistance/impaired Eating/Feeding: Set up;With caregiver independent assisting;Sitting   Grooming: Wash/dry hands;Wash/dry face;Contact guard assist;With caregiver independent assisting;Standing   Upper Body Bathing: Minimal assistance;With caregiver independent assisting   Lower Body Bathing: With caregiver independent assisting;Moderate assistance   Upper Body Dressing : Minimal assistance;With caregiver independent assisting   Lower Body Dressing: Moderate assistance;With caregiver independent assisting   Toilet Transfer: Minimal assistance;With caregiver independent assisting   Toileting- Clothing Manipulation and Hygiene: Minimal assistance;With caregiver independent assisting       Functional mobility during ADLs: Contact guard assist       Vision Baseline Vision/History: 1 Wears glasses Ability to See in Adequate Light: 0 Adequate Patient Visual Report: No change from baseline       Perception  Praxis         Pertinent Vitals/Pain Pain Assessment Pain Assessment: No/denies pain     Extremity/Trunk Assessment Upper Extremity Assessment Upper Extremity Assessment: Generalized weakness;LUE deficits/detail LUE: Unable  to fully assess due to immobilization           Communication Communication Communication: Hearing impairment   Cognition Arousal: Alert Behavior During Therapy: WFL for tasks assessed/performed Overall Cognitive Status: Within Functional Limits for tasks assessed                                       General Comments       Exercises     Shoulder Instructions Shoulder Instructions Donning/doffing shirt without moving shoulder: Minimal assistance;Caregiver independent with task;Patient able to independently direct caregiver Method for sponge bathing under operated UE: Minimal assistance;Caregiver independent with task;Patient able to independently direct caregiver Donning/doffing sling/immobilizer: Minimal assistance;Caregiver independent with task;Patient able to independently direct caregiver Correct positioning of sling/immobilizer: Independent ROM for elbow, wrist and digits of operated UE: Independent;Caregiver independent with task;Patient able to independently direct caregiver Sling wearing schedule (on at all times/off for ADL's): Independent;Caregiver independent with task;Patient able to independently direct caregiver Proper positioning of operated UE when showering: Independent;Caregiver independent with task;Patient able to independently direct caregiver Positioning of UE while sleeping: Independent;Caregiver independent with task;Patient able to independently direct caregiver    Home Living Family/patient expects to be discharged to:: Private residence Living Arrangements: Spouse/significant other Available Help at Discharge: Family;Available PRN/intermittently   Home Access: Stairs to enter Entrance Stairs-Number of Steps: 4 Entrance Stairs-Rails: Right;Left Home Layout: One level     Bathroom Shower/Tub: Walk-in shower;Tub/shower unit   Bathroom Toilet: Handicapped height     Home Equipment: Cane - quad;Cane - single point;Shower seat;Grab  bars - tub/shower          Prior Functioning/Environment Prior Level of Function : Needs assist       Physical Assist : Mobility (physical);ADLs (physical) Mobility (physical): Transfers ADLs (physical): Dressing;Bathing Mobility Comments: uses cane for mobility ADLs Comments: min A with ADLs due to shoulder and mobilty impairments        OT Problem List: Impaired balance (sitting and/or standing);Decreased range of motion;Impaired UE functional use      OT Treatment/Interventions:      OT Goals(Current goals can be found in the care plan section) Acute Rehab OT Goals Patient Stated Goal: go home OT Goal Formulation: With patient  OT Frequency:      Co-evaluation              AM-PAC OT "6 Clicks" Daily Activity     Outcome Measure Help from another person eating meals?: A Little Help from another person taking care of personal grooming?: None Help from another person toileting, which includes using toliet, bedpan, or urinal?: A Little Help from another person bathing (including washing, rinsing, drying)?: A Lot Help from another person to put on and taking off regular upper body clothing?: A Little Help from another person to put on and taking off regular lower body clothing?: A Lot 6 Click Score: 17   End of Session Equipment Utilized During Treatment: Gait belt;Other (comment) [facility]) Nurse Communication: Mobility status  Activity Tolerance: Patient tolerated treatment well Patient left: in chair;with call bell/phone within reach;with family/visitor present  OT Visit Diagnosis: Muscle weakness (generalized) (M62.81);Unsteadiness on feet (R26.81)  Time: 6578-4696 OT Time Calculation (min): 42 min Charges:  OT General Charges $OT Visit: 1 Visit OT Evaluation $OT Eval Low Complexity: 1 Low OT Treatments $Self Care/Home Management : 8-22 mins $Therapeutic Activity: 8-22 mins   Galen Manila 02/16/2023, 12:51 PM

## 2023-02-16 NOTE — Op Note (Signed)
Procedure(s): REVERSE SHOULDER ARTHROPLASTY Procedure Note  Albert Barr male 82 y.o. 02/16/2023  Preoperative diagnosis: Left shoulder end-stage osteoarthritis with severe rotator cuff disease  Postoperative diagnosis: Same  Procedure(s) and Anesthesia Type:    * REVERSE SHOULDER ARTHROPLASTY - General   Indications:  82 y.o. male  With endstage left shoulder arthritis with severe rotator cuff disease and significant loss of motion. Pain and dysfunction interfered with quality of life and nonoperative treatment with activity modification, NSAIDS and injections failed.     Surgeon: Glennon Hamilton   Assistants: Tomma Lightning, RNFA  Anesthesia: General endotracheal anesthesia with preoperative interscalene block given by the attending anesthesiologist    Procedure Detail  REVERSE SHOULDER ARTHROPLASTY   Estimated Blood Loss:  200 mL         Drains: none  Blood Given: none          Specimens: none        Complications:  * No complications entered in OR log *         Disposition: PACU - hemodynamically stable.         Condition: stable      OPERATIVE FINDINGS:  A DJO Altivate pressfit reverse total shoulder arthroplasty was placed with a  size 14 stem, a 36 standard glenosphere, and a standard-mm poly insert. The base plate  fixation was excellent.  PROCEDURE: The patient was identified in the preoperative holding area  where I personally marked the operative site after verifying site, side,  and procedure with the patient. An interscalene block given by  the attending anesthesiologist in the holding area and the patient was taken back to the operating room where all extremities were  carefully padded in position after general anesthesia was induced. She  was placed in a beach-chair position and the operative upper extremity was  prepped and draped in a standard sterile fashion. An approximately 10-  cm incision was made from the tip of the coracoid process  to the center  point of the humerus at the level of the axilla. Dissection was carried  down through subcutaneous tissues to the level of the cephalic vein  which was taken laterally with the deltoid. The pectoralis major was  retracted medially. The subdeltoid space was developed and the lateral  edge of the conjoined tendon was identified. The undersurface of  conjoined tendon was palpated and the musculocutaneous nerve was not in  the field. Retractor was placed underneath the conjoined and second  retractor was placed lateral into the deltoid. The circumflex humeral  artery and vessels were identified and clamped and coagulated. The  biceps tendon was tenotomized.  The subscapularis was taken down as a peel with the underlying capsule.  The  joint was then gently externally rotated while the capsule was released  from the humeral neck around to just beyond the 6 o'clock position. At  this point, the joint was dislocated and the humeral head was presented  into the wound. The excessive osteophyte formation was removed with a  large rongeur.  The cutting guide was used to make the appropriate  head cut and the head was saved for potentially bone grafting.  The glenoid was exposed with the arm in an  abducted extended position. The anterior and posterior labrum were  completely excised and the capsule was released circumferentially to  allow for exposure of the glenoid for preparation. The 2.5 mm drill was  placed using the guide in 5-10 inferior angulation and the tap  was then advanced in the same hole. Small and large reamers were then used. The tap was then removed and the Metaglene was then screwed in with excellent purchase.  The peripheral guide was then used to drilled measured and filled peripheral locking screws. The size 36 standard glenosphere was then impacted on the [facility] taper and the central screw was placed. The humerus was then again exposed and the diaphyseal reamers were used  followed by the metaphyseal reamers. The final broach was left in place in the proximal trial was placed. The joint was reduced and with this implant it was felt that soft tissue tensioning was appropriate with excellent stability and excellent range of motion. Therefore, final humeral stem was placed press-fit.  And then the trial polyethylene inserts were tested again and the above implant was felt to be the most appropriate for final insertion. The joint was reduced taken through full range of motion and felt to be stable. Soft tissue tension was appropriate.  The joint was then copiously irrigated with pulse  lavage and the wound was then closed. The subscapularis was not repaired.  Skin was closed with 2-0 Vicryl in a deep dermal layer and 4-0  Monocryl for skin closure. Steri-Strips were applied. Sterile  dressings were then applied as well as a sling. The patient was allowed  to awaken from general anesthesia, transferred to stretcher, and taken  to recovery room in stable condition.   POSTOPERATIVE PLAN: The patient will be observed in the recovery room and if his pain is well-controlled and he is hemodynamically stable he can be discharged home today with family.

## 2023-02-16 NOTE — Anesthesia Procedure Notes (Signed)
Anesthesia Regional Block: Interscalene brachial plexus block   Pre-Anesthetic Checklist: , timeout performed,  Correct Patient, Correct Site, Correct Laterality,  Correct Procedure, Correct Position, site marked,  Risks and benefits discussed,  Surgical consent,  Pre-op evaluation,  At surgeon's request and post-op pain management  Laterality: Left  Prep: chloraprep       Needles:  Injection technique: Single-shot  Needle Type: Stimiplex     Needle Length: 9cm  Needle Gauge: 21     Additional Needles:   Procedures:,,,, ultrasound used (permanent image in chart),,    Narrative:  Start time: 02/16/2023 7:16 AM End time: 02/16/2023 7:21 AM Injection made incrementally with aspirations every 5 mL.  Performed by: Personally  Anesthesiologist: Lowella Curb, MD

## 2023-02-16 NOTE — Transfer of Care (Signed)
Immediate Anesthesia Transfer of Care Note  Patient: Albert Barr  Procedure(s) Performed: REVERSE SHOULDER ARTHROPLASTY (Left: Shoulder)  Patient Location: PACU  Anesthesia Type:GA combined with regional for post-op pain  Level of Consciousness: awake, alert , oriented, and patient cooperative  Airway & Oxygen Therapy: Patient Spontanous Breathing and Patient connected to face mask oxygen  Post-op Assessment: Report given to RN and Post -op Vital signs reviewed and stable  Post vital signs: Reviewed and stable  Last Vitals:  Vitals Value Taken Time  BP 129/75 02/16/23 0922  Temp    Pulse 65 02/16/23 0923  Resp 14 02/16/23 0923  SpO2 98 % 02/16/23 0923  Vitals shown include unfiled device data.  Last Pain:  Vitals:   02/16/23 0611  TempSrc:   PainSc: 5       Patients Stated Pain Goal: 4 (02/16/23 8295)  Complications: No notable events documented.

## 2023-02-17 ENCOUNTER — Encounter (HOSPITAL_COMMUNITY): Payer: Self-pay | Admitting: Orthopedic Surgery

## 2023-03-03 DIAGNOSIS — M25512 Pain in left shoulder: Secondary | ICD-10-CM | POA: Diagnosis not present

## 2023-03-09 DIAGNOSIS — Z96612 Presence of left artificial shoulder joint: Secondary | ICD-10-CM | POA: Diagnosis not present

## 2023-03-09 DIAGNOSIS — M25612 Stiffness of left shoulder, not elsewhere classified: Secondary | ICD-10-CM | POA: Diagnosis not present

## 2023-03-15 DIAGNOSIS — M25612 Stiffness of left shoulder, not elsewhere classified: Secondary | ICD-10-CM | POA: Diagnosis not present

## 2023-03-15 DIAGNOSIS — Z96612 Presence of left artificial shoulder joint: Secondary | ICD-10-CM | POA: Diagnosis not present

## 2023-03-20 DIAGNOSIS — M25612 Stiffness of left shoulder, not elsewhere classified: Secondary | ICD-10-CM | POA: Diagnosis not present

## 2023-03-20 DIAGNOSIS — E291 Testicular hypofunction: Secondary | ICD-10-CM | POA: Diagnosis not present

## 2023-03-20 DIAGNOSIS — N401 Enlarged prostate with lower urinary tract symptoms: Secondary | ICD-10-CM | POA: Diagnosis not present

## 2023-03-20 DIAGNOSIS — I10 Essential (primary) hypertension: Secondary | ICD-10-CM | POA: Diagnosis not present

## 2023-03-20 DIAGNOSIS — Z96612 Presence of left artificial shoulder joint: Secondary | ICD-10-CM | POA: Diagnosis not present

## 2023-03-20 DIAGNOSIS — E785 Hyperlipidemia, unspecified: Secondary | ICD-10-CM | POA: Diagnosis not present

## 2023-03-22 DIAGNOSIS — M25612 Stiffness of left shoulder, not elsewhere classified: Secondary | ICD-10-CM | POA: Diagnosis not present

## 2023-03-22 DIAGNOSIS — Z96612 Presence of left artificial shoulder joint: Secondary | ICD-10-CM | POA: Diagnosis not present

## 2023-03-27 DIAGNOSIS — I10 Essential (primary) hypertension: Secondary | ICD-10-CM | POA: Diagnosis not present

## 2023-03-27 DIAGNOSIS — H409 Unspecified glaucoma: Secondary | ICD-10-CM | POA: Diagnosis not present

## 2023-03-27 DIAGNOSIS — G609 Hereditary and idiopathic neuropathy, unspecified: Secondary | ICD-10-CM | POA: Diagnosis not present

## 2023-03-27 DIAGNOSIS — Z0000 Encounter for general adult medical examination without abnormal findings: Secondary | ICD-10-CM | POA: Diagnosis not present

## 2023-03-27 DIAGNOSIS — Z1339 Encounter for screening examination for other mental health and behavioral disorders: Secondary | ICD-10-CM | POA: Diagnosis not present

## 2023-03-27 DIAGNOSIS — M21379 Foot drop, unspecified foot: Secondary | ICD-10-CM | POA: Diagnosis not present

## 2023-03-27 DIAGNOSIS — I739 Peripheral vascular disease, unspecified: Secondary | ICD-10-CM | POA: Diagnosis not present

## 2023-03-27 DIAGNOSIS — E785 Hyperlipidemia, unspecified: Secondary | ICD-10-CM | POA: Diagnosis not present

## 2023-03-27 DIAGNOSIS — R82998 Other abnormal findings in urine: Secondary | ICD-10-CM | POA: Diagnosis not present

## 2023-03-27 DIAGNOSIS — Z23 Encounter for immunization: Secondary | ICD-10-CM | POA: Diagnosis not present

## 2023-03-27 DIAGNOSIS — Z1331 Encounter for screening for depression: Secondary | ICD-10-CM | POA: Diagnosis not present

## 2023-04-03 DIAGNOSIS — M25612 Stiffness of left shoulder, not elsewhere classified: Secondary | ICD-10-CM | POA: Diagnosis not present

## 2023-04-03 DIAGNOSIS — Z96612 Presence of left artificial shoulder joint: Secondary | ICD-10-CM | POA: Diagnosis not present

## 2023-04-04 ENCOUNTER — Ambulatory Visit (INDEPENDENT_AMBULATORY_CARE_PROVIDER_SITE_OTHER): Payer: Medicare PPO | Admitting: Internal Medicine

## 2023-04-04 ENCOUNTER — Encounter: Payer: Self-pay | Admitting: Internal Medicine

## 2023-04-04 VITALS — BP 104/62 | HR 78 | Ht 68.5 in | Wt 247.0 lb

## 2023-04-04 DIAGNOSIS — D131 Benign neoplasm of stomach: Secondary | ICD-10-CM | POA: Diagnosis not present

## 2023-04-04 DIAGNOSIS — H9012 Conductive hearing loss, unilateral, left ear, with unrestricted hearing on the contralateral side: Secondary | ICD-10-CM | POA: Diagnosis not present

## 2023-04-04 DIAGNOSIS — D1391 Familial adenomatous polyposis: Secondary | ICD-10-CM | POA: Diagnosis not present

## 2023-04-04 DIAGNOSIS — Z800 Family history of malignant neoplasm of digestive organs: Secondary | ICD-10-CM | POA: Diagnosis not present

## 2023-04-04 DIAGNOSIS — H6122 Impacted cerumen, left ear: Secondary | ICD-10-CM | POA: Diagnosis not present

## 2023-04-04 DIAGNOSIS — H938X2 Other specified disorders of left ear: Secondary | ICD-10-CM | POA: Diagnosis not present

## 2023-04-04 NOTE — Patient Instructions (Signed)
_______________________________________________________  If your blood pressure at your visit was 140/90 or greater, please contact your primary care physician to follow up on this.  _______________________________________________________  If you are age 82 or older, your body mass index should be between 23-30. Your Body mass index is 37.01 kg/m. If this is out of the aforementioned range listed, please consider follow up with your Primary Care Provider.  If you are age 59 or younger, your body mass index should be between 19-25. Your Body mass index is 37.01 kg/m. If this is out of the aformentioned range listed, please consider follow up with your Primary Care Provider.   ________________________________________________________  The [facility] providers would like to encourage you to use [facility] to communicate with providers for non-urgent requests or questions.  Due to long hold times on the telephone, sending your provider a message by [facility] may be a faster and more efficient way to get a response.  Please allow 48 business hours for a response.  Please remember that this is for non-urgent requests.  _______________________________________________________   I appreciate the opportunity to care for you. Stan Head, MD, [facility]

## 2023-04-04 NOTE — Progress Notes (Unsigned)
Albert Barr 82 y.o. [birth year] [id number]  Assessment & Plan:   Encounter Diagnoses  Name Primary?   Polyposis syndrome gastric and colonic - attenuated Yes   Fundic gland polyps of stomach, benign    Family history of gastric cancer    We reviewed the pros and cons of repeating an upper GI endoscopy.  I am inclined to evaluate signs and symptoms as needed as opposed to routine screening/surveillance.  He is comfortable with this and we had already decided that for colonoscopy as well.  I will place a recall office visit 1 year and reassess/revisit  CC: Adrian Prince, MD     Subjective:   Chief Complaint: History of gastric polyps and gastric cancer discuss possible repeat EGD  HPI 82 year old white man with a history of an attenuated gastric and colonic polyposis syndrome with last evaluation at EGD and colonoscopy in June 2022, presents to discuss continuing surveillance/screening EGD.  His mother died of gastric cancer at age of 10.  I had sent him to genetics in the past and there was no genetic abnormality uncovered (2017).  He has lost weight using Wegovy.  He usually eats about 1 meal a day and has some loose stools after that though occasional constipation.  He continues with neuropathy and is hoping to get into neurology in [place].  Going to [facility] now.  Wt Readings from Last 3 Encounters:  04/04/23 247 lb (112 kg)  02/16/23 245 lb (111.1 kg)  02/06/23 245 lb (111.1 kg)  Colonoscopy 12/08/2020   - The entire examined colon is normal on direct and                            retroflexion views.                           - No specimens collected.                           - Personal history of colonic polyps. Attenuated                            polyposis                           tubular adenoma 2/08                           previous hyperplastic polyps                           colonoiscopies since 1988                           2011 - single  adenoma                           11/26/2014 13 polyps largest 15 mm - 10 adenomas -                            repeat colon 2017  12/09/2015 2 diminutive polyps removed right colon                            adenomas                           05/2018 5 polyps - adenomas and 1 ssp  EGD 12/08/2020 - Multiple gastric polyps. Biopsied.-Fundic gland polyps                           - Gastritis.                           - Esophageal plaques were found, consistent with                            candidiasis. I think this is from Adfvair diskus use                           - The examination was otherwise normal.  Allergies  Allergen Reactions   Beclomethasone Rash   Moxifloxacin Diarrhea   Current Meds  Medication Sig   ADVAIR DISKUS 250-50 MCG/DOSE AEPB Inhale 1 puff into the lungs 2 (two) times daily.   albuterol (PROVENTIL HFA;VENTOLIN HFA) 108 (90 BASE) MCG/ACT inhaler Inhale 2 puffs into the lungs every 6 (six) hours as needed for wheezing or shortness of breath.   AMBULATORY NON FORMULARY MEDICATION Bing Neighbors 2 1/2   Foot drop, bilateral  Codes: M21.371, M21.372   ANDROGEL PUMP 20.25 MG/ACT (1.62%) GEL Apply 2 Pump topically 2 (two) times daily.   ascorbic acid (VITAMIN C) 500 MG tablet Take 500 mg by mouth daily.   aspirin 81 MG tablet Take 81 mg by mouth daily.   Cyanocobalamin (B-12) 1000 MCG TABS Take 1,000 Units by mouth daily.   cycloSPORINE (RESTASIS) 0.05 % ophthalmic emulsion Place 1 drop into both eyes 2 (two) times daily.   ezetimibe (ZETIA) 10 MG tablet Take 10 mg by mouth daily.   fish oil-omega-3 fatty acids 1000 MG capsule Take 1 g by mouth in the morning and at bedtime.  300 mg /1000 mg fatty acid   folic acid (FOLVITE) 1 MG tablet Take 1 mg by mouth daily.   gabapentin (NEURONTIN) 300 MG capsule Take 3 capsules (900 mg total) by mouth 2 (two) times daily. (Patient taking differently: Take 600 mg by mouth 2 (two) times daily.)    hydrochlorothiazide (HYDRODIURIL) 25 MG tablet TAKE 1/2 TABLET BY MOUTH EVERY DAY   iron polysaccharides (NIFEREX) 150 MG capsule Take 150 mg by mouth daily.   loratadine (CLARITIN) 10 MG tablet Take 10 mg by mouth daily as needed for allergies.   minocycline (MINOCIN) 50 MG capsule Take 50 mg by mouth daily as needed (Rosacea).   NON FORMULARY Pt uses a cpap nightly   omeprazole (PRILOSEC) 40 MG capsule Take 40 mg by mouth daily.   Polyethyl Glycol-Propyl Glycol (SYSTANE ULTRA) 0.4-0.3 % SOLN Place 1-2 drops into both eyes daily as needed (Dry eyes).   pyridOXINE (VITAMIN B6) 25 MG tablet Take 25 mg by mouth daily.   rosuvastatin (CRESTOR) 20 MG tablet Take 20 mg by mouth daily.   Semaglutide-Weight Management (WEGOVY) 2.4 MG/0.75ML SOAJ Inject 2.4 mg into the skin every Wednesday.  zafirlukast (ACCOLATE) 20 MG tablet Take 20 mg by mouth 2 (two) times daily.   [DISCONTINUED] Vibegron (GEMTESA) 75 MG TABS Take 75 mg by mouth daily.   [DISCONTINUED] Vitamin D, Ergocalciferol, (DRISDOL) 1.25 MG (50000 UNIT) CAPS capsule Take 50,000 Units by mouth once a week.   Past Medical History:  Diagnosis Date   Allergy    Anal fissure    Arthritis    shoulder, knees   Asthma    Atrophic gastritis without mention of hemorrhage    Cataract    bilateraly   Esophageal reflux    Family history of gastric cancer    Fibula fracture    hair line fracture   Gastric polyps    history   Hiatal hernia    Hx of adenomatous colonic polyps    Hyperlipidemia    Hypertension    Metaplasia of esophagus 2011   "gastric metaplasia"   Morbid obesity (HCC)    Obstructive sleep apnea on CPAP    Other voice and resonance disorders    Peripheral neuropathy    Polyposis syndrome gastric and colonic - attenuated 12/09/2015   Sleep apnea    uses CPAP   Squamous cell skin cancer 04/2018   lesion left forearm   Unspecified asthma(493.90)    Past Surgical History:  Procedure Laterality Date   APPENDECTOMY      CHOLECYSTECTOMY     COLONOSCOPY  08/20/2009 (multiple)   Tubular adenoma   ESOPHAGOGASTRODUODENOSCOPY  08/20/09 (multiple)   EYE SURGERY Bilateral 2023   cataract surgery   INCISION AND DRAINAGE PERIRECTAL ABSCESS  06/27/2005   KNEE SURGERY     arthroscopy- both knees   POLYPECTOMY     REVERSE SHOULDER ARTHROPLASTY Right 08/18/2022   Procedure: REVERSE SHOULDER ARTHROPLASTY;  Surgeon: Jones Broom, MD;  Location: [facility];  Service: Orthopedics;  Laterality: Right;   REVERSE SHOULDER ARTHROPLASTY Left 02/16/2023   Procedure: REVERSE SHOULDER ARTHROPLASTY;  Surgeon: Jones Broom, MD;  Location: [facility];  Service: Orthopedics;  Laterality: Left;   UPPER GASTROINTESTINAL ENDOSCOPY     Social History   Social History Narrative   Married, 3 children has grandchildren   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   Review of Systems As per HPI  Objective:   Physical Exam BP 104/62   Pulse 78   Ht 5' 8.5" (1.74 m)   Wt 247 lb (112 kg)   SpO2 97%   BMI 37.01 kg/m

## 2023-04-05 DIAGNOSIS — M25612 Stiffness of left shoulder, not elsewhere classified: Secondary | ICD-10-CM | POA: Diagnosis not present

## 2023-04-05 DIAGNOSIS — Z96612 Presence of left artificial shoulder joint: Secondary | ICD-10-CM | POA: Diagnosis not present

## 2023-04-19 ENCOUNTER — Telehealth: Payer: Self-pay | Admitting: Internal Medicine

## 2023-04-19 NOTE — Telephone Encounter (Signed)
Inbound call from patient stating that he would like to be referred to [facility]. Requesting a call once the referral has been made. Please advise.

## 2023-04-19 NOTE — Telephone Encounter (Signed)
Albert Barr put a referral in the system in June 2024. They closed it out. I spoke with Albert Barr. He wants to see Albert Barr for his hereditary and idiopathic neuropathy. He wants to stay in the [facility] system, it's too hard to go to [place]. The only thing he goes to [facility] is for an annual c-pap. Looks like they closed the referral because they saw he goes to [facility] and [facility]. Please advise Sir, I can put in another referral if okay Sir.

## 2023-04-19 NOTE — Telephone Encounter (Signed)
I had communicated with [facility] Dr. Vickey Huger. I think she agreed to see him and Rx his meds for his established neuropathy based upon the messaging we had.  I suggest he call [facility] and ask Dr. Vickey Huger if she will do that for him.  That is the best I can do.

## 2023-04-20 ENCOUNTER — Telehealth: Payer: Self-pay

## 2023-04-20 NOTE — Telephone Encounter (Signed)
-----   Message from Stan Head sent at 04/20/2023  9:19 AM EDT ----- Regarding: FW: Patient request FYI ----- Message ----- From: Melvyn Novas, MD Sent: 04/19/2023   5:40 PM EDT To: Iva Boop, MD Subject: RE: Patient request                            I would ask PCP to refill standard meds then, but we can discuss this in this patients next visit . CD ----- Message ----- From: Iva Boop, MD Sent: 04/14/2023   2:26 PM EDT To: Melvyn Novas, MD Subject: RE: Patient request                            To the best of my knowledge he just gets routine follow-up - has dx idiopathic neuropathy  Baldo Ash ----- Message ----- From: Melvyn Novas, MD Sent: 04/13/2023   5:42 PM EDT To: Iva Boop, MD Subject: RE: Patient request                            Darrel Hoover Thank you for your question- I don't have the capacity to work him up for neuropathy as a new diagnosis, but if he needs non-scheduled medications refilled , I am happy to do that. Porfirio Mylar D ----- Message ----- From: Iva Boop, MD Sent: 04/05/2023   9:10 AM EDT To: Melvyn Novas, MD Subject: Patient request                                Porfirio Mylar,  Mr. Trammel sees you and your team for OSA.  He has been followed at [facility] for neuropathy and is trying to be followed in [place] due to distance and debility, age, etc.  I told him I would ask you if he could be followed in your clinic for his neuropathy.  Thanks for considering and I understand if you do not have the capacity.  Best regards,  Baldo Ash

## 2023-04-20 NOTE — Telephone Encounter (Signed)
I informed Zella Ball and he will call their office.

## 2023-04-20 NOTE — Telephone Encounter (Signed)
Albert Barr informed and thinks he has plenty on medicine (gabapentin) for now but he verbalized understanding the plan.

## 2023-04-28 ENCOUNTER — Encounter: Payer: Self-pay | Admitting: Neurology

## 2023-05-08 DIAGNOSIS — G4733 Obstructive sleep apnea (adult) (pediatric): Secondary | ICD-10-CM | POA: Diagnosis not present

## 2023-05-10 DIAGNOSIS — M25612 Stiffness of left shoulder, not elsewhere classified: Secondary | ICD-10-CM | POA: Diagnosis not present

## 2023-05-29 ENCOUNTER — Other Ambulatory Visit: Payer: Medicare PPO

## 2023-05-29 ENCOUNTER — Encounter: Payer: Self-pay | Admitting: Neurology

## 2023-05-29 ENCOUNTER — Ambulatory Visit (INDEPENDENT_AMBULATORY_CARE_PROVIDER_SITE_OTHER): Payer: Medicare PPO | Admitting: Neurology

## 2023-05-29 VITALS — BP 130/63 | HR 77 | Ht 68.5 in | Wt 255.0 lb

## 2023-05-29 DIAGNOSIS — M21372 Foot drop, left foot: Secondary | ICD-10-CM | POA: Diagnosis not present

## 2023-05-29 DIAGNOSIS — M21371 Foot drop, right foot: Secondary | ICD-10-CM

## 2023-05-29 DIAGNOSIS — G621 Alcoholic polyneuropathy: Secondary | ICD-10-CM

## 2023-05-29 NOTE — Patient Instructions (Signed)
Check labs  Recommend that you avoid alcohol to prevent worsening neuropathy  Check feet daily  Always use a cane an your braces

## 2023-05-29 NOTE — Progress Notes (Signed)
[facility] Clinic Note - Initial Visit   Date: 05/29/2023   Albert Barr MRN: [id number] DOB: [birth year]   Dear Dr. Evlyn Kanner:  Thank you for your kind referral of Albert Barr for consultation of neuropathy. Although his history is well known to you, please allow [place] to reiterate it for the purpose of our medical record. The patient was accompanied to the clinic by wife who also provides collateral information.     Albert Barr is a 82 y.o. right-handed male with hypertension, hyperlipidemia, and OSA presenting for evaluation of peripheral neurpathy.   IMPRESSION/PLAN: Severe peripheral neuropathy with distal sensory loss, bilateral foot drop, and hand weakness, due to alcohol.  Diagnosed ~ 2013. Prior NCS/EMG confirmed diagnosis.   - Check folate, vitamin B1, copper, SPEP with IFE  - He has completed PT in the past  - He may taper gabapentin to 600mg  at bedtime given that he does not have significant pain.   - Discussed importance of abstaining from alcohol or at the very least cutting back  - Patient educated on daily foot inspection, fall prevention, and safety precautions around the home.  Return to clinic in 1 year  ------------------------------------------------------------- History of present illness: He has a long history of peripheral neuropathy since around ~2008.  He has numbness, tingling, and weakness in the feet and lower legs.  He has been wearing bilateral AFOs for bilateral foot drop which significant helped with gait.  He also uses a cane.  No falls in the past year. He was initially evaluated by spine specialist to be sure symptoms were not stemming from his back. He was then evaluated at [facility] and had EMG which confirmed the presence of neurpathy.  More recently, he was seeing Dr. Kate Sable and would like to transfer care locally.  He was taking gabapentin 900mg  BID and reduced this to 600mg  BID.  He denies significant pain.   He  drinks almost a bottle of wine daily for many years. No diabetes or exposure to chemotherapy.   Out-side paper records, electronic medical record, and images have been reviewed where available and summarized as:  Lab Results  Component Value Date   HGBA1C 5.6 05/18/2007   No results found for: "VITAMINB12" No results found for: "TSH" No results found for: "ESRSEDRATE", "POCTSEDRATE"  Past Medical History:  Diagnosis Date   Allergy    Anal fissure    Arthritis    shoulder, knees   Asthma    Atrophic gastritis without mention of hemorrhage    Cataract    bilateraly   Esophageal reflux    Family history of gastric cancer    Fibula fracture    hair line fracture   Gastric polyps    history   Hiatal hernia    Hx of adenomatous colonic polyps    Hyperlipidemia    Hypertension    Metaplasia of esophagus 2011   "gastric metaplasia"   Morbid obesity (HCC)    Obstructive sleep apnea on CPAP    Other voice and resonance disorders    Peripheral neuropathy    Polyposis syndrome gastric and colonic - attenuated 12/09/2015   Sleep apnea    uses CPAP   Squamous cell skin cancer 04/2018   lesion left forearm   Unspecified asthma(493.90)     Past Surgical History:  Procedure Laterality Date   APPENDECTOMY     CHOLECYSTECTOMY     COLONOSCOPY  08/20/2009 (multiple)   Tubular adenoma   ESOPHAGOGASTRODUODENOSCOPY  08/20/09 (multiple)   EYE SURGERY Bilateral 2023   cataract surgery   INCISION AND DRAINAGE PERIRECTAL ABSCESS  06/27/2005   KNEE SURGERY     arthroscopy- both knees   POLYPECTOMY     REVERSE SHOULDER ARTHROPLASTY Right 08/18/2022   Procedure: REVERSE SHOULDER ARTHROPLASTY;  Surgeon: Jones Broom, MD;  Location: [facility];  Service: Orthopedics;  Laterality: Right;   REVERSE SHOULDER ARTHROPLASTY Left 02/16/2023   Procedure: REVERSE SHOULDER ARTHROPLASTY;  Surgeon: Jones Broom, MD;  Location: [facility];  Service: Orthopedics;  Laterality: Left;   UPPER  GASTROINTESTINAL ENDOSCOPY       Medications:  Outpatient Encounter Medications as of 05/29/2023  Medication Sig   ADVAIR DISKUS 250-50 MCG/DOSE AEPB Inhale 1 puff into the lungs 2 (two) times daily.   albuterol (PROVENTIL HFA;VENTOLIN HFA) 108 (90 BASE) MCG/ACT inhaler Inhale 2 puffs into the lungs every 6 (six) hours as needed for wheezing or shortness of breath.   AMBULATORY NON FORMULARY MEDICATION Bing Neighbors 2 1/2   Foot drop, bilateral  Codes: M21.371, M21.372   ANDROGEL PUMP 20.25 MG/ACT (1.62%) GEL Apply 2 Pump topically 2 (two) times daily.   ascorbic acid (VITAMIN C) 500 MG tablet Take 500 mg by mouth daily.   aspirin 81 MG tablet Take 81 mg by mouth daily.   Cyanocobalamin (B-12) 1000 MCG TABS Take 1,000 Units by mouth daily.   cycloSPORINE (RESTASIS) 0.05 % ophthalmic emulsion Place 1 drop into both eyes 2 (two) times daily.   ezetimibe (ZETIA) 10 MG tablet Take 10 mg by mouth daily.   fish oil-omega-3 fatty acids 1000 MG capsule Take 1 g by mouth in the morning and at bedtime.  300 mg /1000 mg fatty acid   folic acid (FOLVITE) 1 MG tablet Take 1 mg by mouth daily.   gabapentin (NEURONTIN) 300 MG capsule Take 3 capsules (900 mg total) by mouth 2 (two) times daily. (Patient taking differently: Take 600 mg by mouth 2 (two) times daily.)   hydrochlorothiazide (HYDRODIURIL) 25 MG tablet TAKE 1/2 TABLET BY MOUTH EVERY DAY   iron polysaccharides (NIFEREX) 150 MG capsule Take 150 mg by mouth daily.   loratadine (CLARITIN) 10 MG tablet Take 10 mg by mouth daily as needed for allergies.   minocycline (MINOCIN) 50 MG capsule Take 50 mg by mouth daily as needed (Rosacea).   NON FORMULARY Pt uses a cpap nightly   omeprazole (PRILOSEC) 40 MG capsule Take 40 mg by mouth daily.   Polyethyl Glycol-Propyl Glycol (SYSTANE ULTRA) 0.4-0.3 % SOLN Place 1-2 drops into both eyes daily as needed (Dry eyes).   pyridOXINE (VITAMIN B6) 25 MG tablet Take 25 mg by mouth daily.   rosuvastatin  (CRESTOR) 20 MG tablet Take 20 mg by mouth daily.   Semaglutide-Weight Management (WEGOVY) 2.4 MG/0.75ML SOAJ Inject 2.4 mg into the skin every Wednesday.   zafirlukast (ACCOLATE) 20 MG tablet Take 20 mg by mouth 2 (two) times daily.   No facility-administered encounter medications on file as of 05/29/2023.    Allergies:  Allergies  Allergen Reactions   Beclomethasone Rash   Moxifloxacin Diarrhea    Family History: Family History  Problem Relation Age of Onset   Stomach cancer Mother 63   Cancer Sister        bladder   Sleep apnea Son    Colon cancer Neg Hx    Esophageal cancer Neg Hx    Rectal cancer Neg Hx     Social History: Social History   Tobacco  Use   Smoking status: Former    Current packs/day: 0.00    Average packs/day: 2.0 packs/day for 10.0 years (20.0 ttl pk-yrs)    Types: Cigarettes    Start date: 02/17/1964    Quit date: 02/16/1974    Years since quitting: 49.3   Smokeless tobacco: Never  Vaping Use   Vaping status: Never Used  Substance Use Topics   Alcohol use: Yes    Alcohol/week: 7.0 standard drinks of alcohol    Types: 7 Glasses of wine per week    Comment: moderate wine/cocktail   Drug use: No   Social History   Social History Narrative   Married, 3 children has grandchildren   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 Right Handed    Are you currently employed ? No    What is your current occupation?   Do you live at home alone? No    Who lives with you? Wife- Darl Pikes    What type of home do you live in: 1 story or 2 story? Lives in one story home        Vital Signs:  BP 130/63   Pulse 77   Ht 5' 8.5" (1.74 m)   Wt 255 lb (115.7 kg)   SpO2 94%   BMI 38.21 kg/m    Neurological Exam: MENTAL STATUS including orientation to time, place, person, recent and remote memory, attention span and concentration, language, and fund of knowledge is normal.  Speech is not dysarthric.  CRANIAL NERVES: II:  No visual field defects.     III-IV-VI: Pupils equal round and reactive to light.  Normal conjugate, extra-ocular eye movements in all directions of gaze.  No nystagmus.  No ptosis.   V:  Normal facial sensation.    VII:  Normal facial symmetry and movements.   VIII:  Normal hearing and vestibular function.   IX-X:  Normal palatal movement.   XI:  Normal shoulder shrug and head rotation.   XII:  Normal tongue strength and range of motion, no deviation or fasciculation.  MOTOR:  prominent atrophy in the intrinsic hand muscles and muscles of the lower extremity.  No fasciculations or abnormal movements.  No pronator drift.   Upper Extremity:  Right  Left  Deltoid  5/5   5/5   Biceps  5/5   5/5   Triceps  5/5   5/5   Wrist extensors  5/5   5/5   Wrist flexors  5/5   5/5   Finger extensors  4/5   4/5   Finger flexors  4/5   4/5   Dorsal interossei  4/5   4/5   Abductor pollicis  4/5   4/5   Tone (Ashworth scale)  0  0   Lower Extremity:  Right  Left  Hip flexors  5/5   5/5   Hip extensors  5/5   5/5   Adductor 5/5  5/5  Abductor 5/5  5/5  Knee flexors  5/5   5/5   Knee extensors  5/5   5/5   Dorsiflexors  2/5   2/5   Plantarflexors  2/5   2/5   Toe extensors  1/5  1/5   Toe flexors  2/5   2/5   Tone (Ashworth scale)  0  0   MSRs:                                           Right        Left brachioradialis 1+  1+  biceps 1+  1+  triceps 1+  1+  patellar 0  0  ankle jerk 0  0  Hoffman no  no  plantar response down  down   SENSORY: Trace vibration at the knees, absent below the ankles.  Temperature and pin prick reduced from the mid-calf distally.  Sensation in the hands intact.   COORDINATION/GAIT: Normal finger-to- nose-finger.  Steppage  gait bilaterally, assisted with cane.  Gait is improved with bilateral AFOs.   Total time spent reviewing records, interview, history/exam, documentation, and coordination of care on day of encounter:  45 min    Thank you for allowing me to participate in patient's care.  If I can answer any additional questions, I would be pleased to do so.    Sincerely,    Ardella Chhim K. Allena Katz, DO

## 2023-06-05 LAB — VITAMIN B1: Vitamin B1 (Thiamine): 6 nmol/L — ABNORMAL LOW (ref 8–30)

## 2023-06-05 LAB — PROTEIN ELECTROPHORESIS, SERUM
Albumin ELP: 4 g/dL (ref 3.8–4.8)
Alpha 1: 0.3 g/dL (ref 0.2–0.3)
Alpha 2: 0.6 g/dL (ref 0.5–0.9)
Beta 2: 0.3 g/dL (ref 0.2–0.5)
Beta Globulin: 0.4 g/dL (ref 0.4–0.6)
Gamma Globulin: 1 g/dL (ref 0.8–1.7)
Total Protein: 6.5 g/dL (ref 6.1–8.1)

## 2023-06-05 LAB — CERULOPLASMIN: Ceruloplasmin: 16 mg/dL (ref 14–30)

## 2023-06-05 LAB — TEST AUTHORIZATION

## 2023-06-05 LAB — IMMUNOFIXATION ELECTROPHORESIS
IgM, Serum: 1043 mg/dL — ABNORMAL LOW (ref 600–300)
IgM, Serum: 43 mg/dL — ABNORMAL LOW (ref 50–300)
Immunoglobulin A: 1043 mg/dL (ref 70–320)
Immunoglobulin A: 227 mg/dL (ref 70–320)

## 2023-06-05 LAB — COPPER, SERUM: Copper: 58 ug/dL — ABNORMAL LOW (ref 70–175)

## 2023-06-05 LAB — FOLATE: Folate: >24 ng/mL

## 2023-06-07 ENCOUNTER — Other Ambulatory Visit: Payer: Self-pay

## 2023-06-07 DIAGNOSIS — Z79899 Other long term (current) drug therapy: Secondary | ICD-10-CM

## 2023-06-07 DIAGNOSIS — G621 Alcoholic polyneuropathy: Secondary | ICD-10-CM

## 2023-06-07 DIAGNOSIS — M21372 Foot drop, left foot: Secondary | ICD-10-CM

## 2023-06-13 ENCOUNTER — Other Ambulatory Visit: Payer: Self-pay | Admitting: Sports Medicine

## 2023-07-12 ENCOUNTER — Other Ambulatory Visit: Payer: Self-pay | Admitting: Sports Medicine

## 2023-07-17 ENCOUNTER — Other Ambulatory Visit: Payer: Self-pay

## 2023-07-17 MED ORDER — MELOXICAM 15 MG PO TABS: ORAL_TABLET | ORAL | 2 refills | Status: DC

## 2023-07-17 NOTE — Progress Notes (Signed)
Pt called requesting Meloxicam refill.

## 2023-08-21 DIAGNOSIS — M25512 Pain in left shoulder: Secondary | ICD-10-CM | POA: Diagnosis not present

## 2023-09-05 DIAGNOSIS — E785 Hyperlipidemia, unspecified: Secondary | ICD-10-CM | POA: Diagnosis not present

## 2023-09-05 DIAGNOSIS — G4730 Sleep apnea, unspecified: Secondary | ICD-10-CM | POA: Diagnosis not present

## 2023-09-05 DIAGNOSIS — I739 Peripheral vascular disease, unspecified: Secondary | ICD-10-CM | POA: Diagnosis not present

## 2023-09-05 DIAGNOSIS — M21379 Foot drop, unspecified foot: Secondary | ICD-10-CM | POA: Diagnosis not present

## 2023-09-05 DIAGNOSIS — I10 Essential (primary) hypertension: Secondary | ICD-10-CM | POA: Diagnosis not present

## 2023-09-05 DIAGNOSIS — I872 Venous insufficiency (chronic) (peripheral): Secondary | ICD-10-CM | POA: Diagnosis not present

## 2023-09-05 DIAGNOSIS — D519 Vitamin B12 deficiency anemia, unspecified: Secondary | ICD-10-CM | POA: Diagnosis not present

## 2023-09-06 ENCOUNTER — Other Ambulatory Visit: Payer: Self-pay | Admitting: Sports Medicine

## 2023-09-08 ENCOUNTER — Other Ambulatory Visit: Payer: Self-pay

## 2023-09-08 MED ORDER — GABAPENTIN 300 MG PO CAPS: 600.0000 mg | ORAL_CAPSULE | Freq: Two times a day (BID) | ORAL | 2 refills | Status: DC

## 2023-09-08 NOTE — Progress Notes (Signed)
 Pt called asking for refill on gabapentin 300 mg. States he is taking 2 capsules twice daily.  Refill sent to pharmacy.

## 2023-09-17 NOTE — Progress Notes (Unsigned)
 PATIENT: Albert Barr DOB: January 24, 1941  REASON FOR VISIT: follow up HISTORY FROM: patient PRIMARY NEUROLOGIST: Dr. Vickey Huger  No chief complaint on file.    HISTORY OF PRESENT ILLNESS: Today 09/17/23:  Albert Barr is a 83 y.o. male with a history of obstructive sleep apnea on CPAP. Returns today for follow-up.  Overall he feels that he is doing well.  Continues to find CPAP beneficial.  Download is below.       09/14/21: Mr. Marriott is an 83 year old male with a history of obstructive sleep apnea on CPAP.  He returns today for follow-up.  He reports that CPAP is working well.  He does feel that the pressure could be slightly increased.  Denies any new issues   REVIEW OF SYSTEMS: Out of a complete 14 system review of symptoms, the patient complains only of the following symptoms, and all other reviewed systems are negative.   ESS 3  ALLERGIES: Allergies  Allergen Reactions   Beclomethasone Rash   Moxifloxacin Diarrhea    HOME MEDICATIONS: Outpatient Medications Prior to Visit  Medication Sig Dispense Refill   ADVAIR DISKUS 250-50 MCG/DOSE AEPB Inhale 1 puff into the lungs 2 (two) times daily.     albuterol (PROVENTIL HFA;VENTOLIN HFA) 108 (90 BASE) MCG/ACT inhaler Inhale 2 puffs into the lungs every 6 (six) hours as needed for wheezing or shortness of breath.     AMBULATORY NON FORMULARY MEDICATION Bing Neighbors 2 1/2   Foot drop, bilateral  Codes: M21.371, M21.372 1 Device 1   ANDROGEL PUMP 20.25 MG/ACT (1.62%) GEL Apply 2 Pump topically 2 (two) times daily.     ascorbic acid (VITAMIN C) 500 MG tablet Take 500 mg by mouth daily.     aspirin 81 MG tablet Take 81 mg by mouth daily.     Cyanocobalamin (B-12) 1000 MCG TABS Take 1,000 Units by mouth daily.     cycloSPORINE (RESTASIS) 0.05 % ophthalmic emulsion Place 1 drop into both eyes 2 (two) times daily.     ezetimibe (ZETIA) 10 MG tablet Take 10 mg by mouth daily.     fish oil-omega-3 fatty acids 1000 MG  capsule Take 1 g by mouth in the morning and at bedtime.  300 mg /1000 mg fatty acid     folic acid (FOLVITE) 1 MG tablet Take 1 mg by mouth daily.     gabapentin (NEURONTIN) 300 MG capsule Take 2 capsules (600 mg total) by mouth 2 (two) times daily. 120 capsule 2   hydrochlorothiazide (HYDRODIURIL) 25 MG tablet TAKE 1/2 TABLET BY MOUTH EVERY DAY 60 tablet 0   iron polysaccharides (NIFEREX) 150 MG capsule Take 150 mg by mouth daily.     loratadine (CLARITIN) 10 MG tablet Take 10 mg by mouth daily as needed for allergies.     meloxicam (MOBIC) 15 MG tablet TAKE 1 TABLET(15 MG) BY MOUTH DAILY AS NEEDED FOR PAIN 30 tablet 2   minocycline (MINOCIN) 50 MG capsule Take 50 mg by mouth daily as needed (Rosacea).     NON FORMULARY Pt uses a cpap nightly     omeprazole (PRILOSEC) 40 MG capsule Take 40 mg by mouth daily.     Polyethyl Glycol-Propyl Glycol (SYSTANE ULTRA) 0.4-0.3 % SOLN Place 1-2 drops into both eyes daily as needed (Dry eyes).     pyridOXINE (VITAMIN B6) 25 MG tablet Take 25 mg by mouth daily.     rosuvastatin (CRESTOR) 20 MG tablet Take 20 mg by mouth daily.  Semaglutide-Weight Management (WEGOVY) 2.4 MG/0.75ML SOAJ Inject 2.4 mg into the skin every Wednesday.     zafirlukast (ACCOLATE) 20 MG tablet Take 20 mg by mouth 2 (two) times daily.     No facility-administered medications prior to visit.    PAST MEDICAL HISTORY: Past Medical History:  Diagnosis Date   Allergy    Anal fissure    Arthritis    shoulder, knees   Asthma    Atrophic gastritis without mention of hemorrhage    Cataract    bilateraly   Esophageal reflux    Family history of gastric cancer    Fibula fracture    hair line fracture   Gastric polyps    history   Hiatal hernia    Hx of adenomatous colonic polyps    Hyperlipidemia    Hypertension    Metaplasia of esophagus 2011   "gastric metaplasia"   Morbid obesity (HCC)    Obstructive sleep apnea on CPAP    Other voice and resonance disorders     Peripheral neuropathy    Polyposis syndrome gastric and colonic - attenuated 12/09/2015   Sleep apnea    uses CPAP   Squamous cell skin cancer 04/2018   lesion left forearm   Unspecified asthma(493.90)     PAST SURGICAL HISTORY: Past Surgical History:  Procedure Laterality Date   APPENDECTOMY     CHOLECYSTECTOMY     COLONOSCOPY  08/20/2009 (multiple)   Tubular adenoma   ESOPHAGOGASTRODUODENOSCOPY  08/20/09 (multiple)   EYE SURGERY Bilateral 2023   cataract surgery   INCISION AND DRAINAGE PERIRECTAL ABSCESS  06/27/2005   KNEE SURGERY     arthroscopy- both knees   POLYPECTOMY     REVERSE SHOULDER ARTHROPLASTY Right 08/18/2022   Procedure: REVERSE SHOULDER ARTHROPLASTY;  Surgeon: Jones Broom, MD;  Location: [facility];  Service: Orthopedics;  Laterality: Right;   REVERSE SHOULDER ARTHROPLASTY Left 02/16/2023   Procedure: REVERSE SHOULDER ARTHROPLASTY;  Surgeon: Jones Broom, MD;  Location: [facility];  Service: Orthopedics;  Laterality: Left;   UPPER GASTROINTESTINAL ENDOSCOPY      FAMILY HISTORY: Family History  Problem Relation Age of Onset   Stomach cancer Mother 31   Cancer Sister        bladder   Sleep apnea Son    Colon cancer Neg Hx    Esophageal cancer Neg Hx    Rectal cancer Neg Hx     SOCIAL HISTORY: Social History   Socioeconomic History   Marital status: Married    Spouse name: Not on file   Number of children: 3   Years of education: Not on file   Highest education level: Not on file  Occupational History   Occupation: Retired    Associate Professor: [employer]    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  Tobacco Use   Smoking status: Former    Current packs/day: 0.00    Average packs/day: 2.0 packs/day for 10.0 years (20.0 ttl pk-yrs)    Types: Cigarettes    Start date: 02/17/1964    Quit date: 02/16/1974    Years since quitting: 49.6   Smokeless tobacco: Never  Vaping Use   Vaping status: Never Used  Substance and Sexual Activity   Alcohol use: Yes     Alcohol/week: 7.0 standard drinks of alcohol    Types: 7 Glasses of wine per week    Comment: moderate wine/cocktail   Drug use: No   Sexual activity: Not on file  Other Topics Concern   Not on file  Social History Narrative   Married, 3 children has grandchildren   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 Right Handed    Are you currently employed ? No    What is your current occupation?   Do you live at home alone? No    Who lives with you? Wife- Darl Pikes    What type of home do you live in: 1 story or 2 story? Lives in one story home       Social Drivers of Health   Financial Resource Strain: Not on file  Food Insecurity: Not on file  Transportation Needs: Not on file  Physical Activity: Not on file  Stress: Not on file  Social Connections: Not on file  Intimate Partner Violence: Not on file      PHYSICAL EXAM  There were no vitals filed for this visit.  There is no height or weight on file to calculate BMI.  Generalized: Well developed, in no acute distress  Chest: Lungs clear to auscultation bilaterally  Neurological examination  Mentation: Alert oriented to time, place, history taking. Follows all commands speech and language fluent Cranial nerve II-XII: Extraocular movements were full, visual field were full on confrontational test Head turning and shoulder shrug  were normal and symmetric. Motor: The motor testing reveals 5 over 5 strength of all 4 extremities. Good symmetric motor tone is noted throughout.  Sensory: Sensory testing is intact to soft touch on all 4 extremities. No evidence of extinction is noted.  Gait and station: Gait is normal.    DIAGNOSTIC DATA (LABS, IMAGING, TESTING) - I  reviewed patient records, labs, notes, testing and imaging myself where available.  Lab Results  Component Value Date   WBC 6.7 02/06/2023   HGB 13.2 02/06/2023   HCT 39.7 02/06/2023   MCV 97.5 02/06/2023   PLT 157 02/06/2023      Component Value Date/Time   NA 141 02/06/2023 1426   K 3.8 02/06/2023 1426   CL 103 02/06/2023 1426   CO2 28 02/06/2023 1426   GLUCOSE 105 (H) 02/06/2023 1426   BUN 10 02/06/2023 1426   CREATININE 0.80 02/06/2023 1426   CALCIUM 9.3 02/06/2023 1426   PROT 6.5 05/29/2023 1142   ALBUMIN 4.0 04/23/2008 1012   AST 23 04/23/2008 1012   ALT 25 04/23/2008 1012   ALKPHOS 38 (L) 04/23/2008 1012   BILITOT 0.6 04/23/2008 1012   GFRNONAA >60 02/06/2023 1426   GFRAA 124 04/23/2008 1012   Lab Results  Component Value Date   CHOL 156 04/23/2008   HDL 42.3 04/23/2008   LDLCALC 89 04/23/2008   TRIG 122 04/23/2008   CHOLHDL 3.7 CALC 04/23/2008   Lab Results  Component Value Date   HGBA1C 5.6 05/18/2007   No results found for: "VITAMINB12" No results found for: "TSH"    ASSESSMENT AND PLAN 83 y.o. year old male  has a past medical history of Allergy, Anal fissure, Arthritis, Asthma, Atrophic gastritis without mention of hemorrhage, Cataract, Esophageal reflux, Family history of gastric cancer, Fibula fracture, Gastric polyps, Hiatal hernia, adenomatous colonic polyps, Hyperlipidemia, Hypertension, Metaplasia of esophagus (2011), Morbid obesity (HCC), Obstructive sleep apnea on CPAP, Other voice and resonance disorders, Peripheral  neuropathy, Polyposis syndrome gastric and colonic - attenuated (12/09/2015), Sleep apnea, Squamous cell skin cancer (04/2018), and Unspecified asthma(493.90). here with:  OSA on CPAP  - CPAP compliance excellent - Good treatment of AHI  - Encourage patient to use CPAP nightly and > 4 hours each night - Increase pressure 8 to 14 - BP slightly low- advised to check it at home - F/U in 1 year or sooner if needed    Butch Penny, MSN, NP-C 09/17/2023, 8:46 PM [facility] [address], [address] [place], [place] [address] [phone]

## 2023-09-18 ENCOUNTER — Encounter: Payer: Self-pay | Admitting: Adult Health

## 2023-09-18 ENCOUNTER — Ambulatory Visit (INDEPENDENT_AMBULATORY_CARE_PROVIDER_SITE_OTHER): Payer: TRICARE For Life (TFL) | Admitting: Adult Health

## 2023-09-18 VITALS — BP 144/70 | HR 72 | Ht 69.0 in | Wt 245.2 lb

## 2023-09-18 DIAGNOSIS — G4733 Obstructive sleep apnea (adult) (pediatric): Secondary | ICD-10-CM

## 2023-09-18 NOTE — Patient Instructions (Signed)
 Your Plan:  Continue CPAP  Change pressure 8-15 EPR 1 If your symptoms worsen or you develop new symptoms please let us know.   Thank you for coming to see [place] at [facility]. I hope we have been able to provide you high quality care today.  You may receive a patient satisfaction survey over the next few weeks. We would appreciate your feedback and comments so that we may continue to improve ourselves and the health of our patients.

## 2023-09-19 ENCOUNTER — Other Ambulatory Visit: Payer: Self-pay | Admitting: Sports Medicine

## 2023-09-20 NOTE — Progress Notes (Signed)
 Pressure change order sent to [facility].

## 2023-09-21 NOTE — Progress Notes (Signed)
 New, Maryella Shivers, Otilio Jefferson, RN; Alain Honey; Jeris Penta, [place]; 1 other Received, thank you!

## 2023-10-16 ENCOUNTER — Other Ambulatory Visit: Payer: Self-pay | Admitting: *Deleted

## 2023-10-16 ENCOUNTER — Other Ambulatory Visit: Payer: Self-pay | Admitting: Sports Medicine

## 2023-10-16 MED ORDER — MELOXICAM 15 MG PO TABS: ORAL_TABLET | ORAL | 1 refills | Status: AC

## 2023-10-30 DIAGNOSIS — H5203 Hypermetropia, bilateral: Secondary | ICD-10-CM | POA: Diagnosis not present

## 2023-10-30 DIAGNOSIS — Z961 Presence of intraocular lens: Secondary | ICD-10-CM | POA: Diagnosis not present

## 2023-10-30 DIAGNOSIS — H524 Presbyopia: Secondary | ICD-10-CM | POA: Diagnosis not present

## 2023-10-30 DIAGNOSIS — H04123 Dry eye syndrome of bilateral lacrimal glands: Secondary | ICD-10-CM | POA: Diagnosis not present

## 2023-10-30 DIAGNOSIS — H52223 Regular astigmatism, bilateral: Secondary | ICD-10-CM | POA: Diagnosis not present

## 2023-10-30 DIAGNOSIS — H40053 Ocular hypertension, bilateral: Secondary | ICD-10-CM | POA: Diagnosis not present

## 2023-11-07 DIAGNOSIS — M79675 Pain in left toe(s): Secondary | ICD-10-CM | POA: Diagnosis not present

## 2023-11-07 DIAGNOSIS — Z6836 Body mass index (BMI) 36.0-36.9, adult: Secondary | ICD-10-CM | POA: Diagnosis not present

## 2023-11-07 DIAGNOSIS — I10 Essential (primary) hypertension: Secondary | ICD-10-CM | POA: Diagnosis not present

## 2023-11-07 DIAGNOSIS — I739 Peripheral vascular disease, unspecified: Secondary | ICD-10-CM | POA: Diagnosis not present

## 2023-11-07 DIAGNOSIS — I872 Venous insufficiency (chronic) (peripheral): Secondary | ICD-10-CM | POA: Diagnosis not present

## 2023-11-24 ENCOUNTER — Telehealth: Payer: Self-pay | Admitting: Adult Health

## 2023-11-24 DIAGNOSIS — G4733 Obstructive sleep apnea (adult) (pediatric): Secondary | ICD-10-CM

## 2023-11-24 NOTE — Telephone Encounter (Signed)
 Pt called wanting to inform provider that he would like to start the process in getting a new Cpap machine. Pt would like to know wheat he has to do to start it. Please advise.

## 2023-11-27 NOTE — Telephone Encounter (Signed)
 I called pt. He wants to be insurance will cover a new machine and sleep study. He is amenable to proceeding with sleep study and prefers a home test if possible. Patient's questions were answered. He is aware the sleep lab will be in touch.

## 2023-11-27 NOTE — Telephone Encounter (Signed)
 Last seen 09/18/23. Setup date for machine was 02/09/2018. Pt will need a new sleep study before getting new machine.

## 2023-11-28 NOTE — Telephone Encounter (Signed)
 That's fine

## 2023-11-29 NOTE — Telephone Encounter (Signed)
 Noted, thank you

## 2023-12-02 ENCOUNTER — Other Ambulatory Visit: Payer: Self-pay | Admitting: Sports Medicine

## 2023-12-19 ENCOUNTER — Ambulatory Visit (INDEPENDENT_AMBULATORY_CARE_PROVIDER_SITE_OTHER): Admitting: Neurology

## 2023-12-19 DIAGNOSIS — K2940 Chronic atrophic gastritis without bleeding: Secondary | ICD-10-CM

## 2023-12-19 DIAGNOSIS — G4733 Obstructive sleep apnea (adult) (pediatric): Secondary | ICD-10-CM

## 2023-12-20 NOTE — Progress Notes (Signed)
 [facility] 83 year old male [birth year]   HOME SLEEP TEST REPORT ( by Watch PAT)   STUDY DATE:  12-19-2023    ORDERING CLINICIAN: Duwaine Russell, NP  REFERRING CLINICIAN: Elspeth Ore, MD    CLINICAL INFORMATION/HISTORY: 09/18/2023:  Mr. Dollens is an 83 year old male with a history of obstructive sleep apnea on CPAP. He returns today for follow-up. He reports that CPAP is working well.  Mr Filsinger is an established CPAP user and  currently on auto- CPAP , set between 8 - 14 cm water  pressure ,with 1 cm water  pressure EPR,  residual AHI of 7.9/h  all obstructive , 95% pressure was 14 cm water .       Epworth sleepiness score: 3 /24.   BMI: 36 kg/m   Neck Circumference: NA   FINDINGS:   Sleep Summary:   Total Recording Time (hours, min): 9 hours 36 minutes      Total Sleep Time (hours, min): 8 hours 30 minutes               Percent REM (%): 17.4%                                      Respiratory Indices:   Calculated pAHI (per CMS guideline): 16.5/h.  Applying AASM criteria the AHI would have been 30/h                         REM pAHI:   32.4/h                                              NREM pAHI: 13.2/h                            Positional AHI:   Exclusively supine Snoring: Reaching a mean volume of 41 dB this was mild snoring and present for over a third of total recorded sleep time.                                              Oxygen Saturation Statistics:   Oxygen Saturation (%) Mean: 91% ( low)              O2 Saturation Range (%): Between a nadir at 76% with a maximum of 98%.  There were a total of 130 desaturation events.                                     O2 Saturation (minutes) <89%: 26 minutes total desaturation time.          Pulse Rate Statistics:   Pulse Mean (bpm):   67 bpm              Pulse Range:   Between a minimum of 57 and a maximum of 102 bpm              IMPRESSION:  This HST confirms  the  presence of all obstructive sleep apnea with a strong REM sleep dependence.  When applying CMS criteria this apnea is moderately severe, under the criteria of the American Academy of sleep medicine this would have been severe apnea.  Concerning is the associated hypoxia with a nadir at 76% and the mean saturation of only 91% there was no tachy-bradycardia noted.   RECOMMENDATION: Continuation of positive airway pressure therapy is indicated due to REM sleep dependence of this apnea from and associated hypoxia.  CPAP therapy also treats snoring, aside from lifting the saturation nadir and reducing apnea and hypopnea significantly.  My recommendation for this experience CPAP use at is to continue therapy by auto titration CPAP device again, between 7 and 17 cm water  pressure, 3 cm H20 EPR, heated humidification and mask of patient's choice.  In addition, weight loss could be very beneficial.     INTERPRETING PHYSICIAN:  Dedra Gores, MD  [facility] Sleep Board certified by The [employer] of Sleep Medicine and Diplomate of the [employer] of Sleep Medicine. Board certified In Neurology through the ABPN, Fellow of the [employer] of Neurology.     Cc Dr Nichole, MD

## 2024-01-05 ENCOUNTER — Ambulatory Visit: Payer: Self-pay | Admitting: Adult Health

## 2024-01-05 DIAGNOSIS — G4733 Obstructive sleep apnea (adult) (pediatric): Secondary | ICD-10-CM

## 2024-01-05 NOTE — Procedures (Signed)
 [facility] 83 year old male 28-Nov-1940   HOME SLEEP TEST REPORT ( by Watch PAT)   STUDY DATE:  12-19-2023    ORDERING CLINICIAN: Duwaine Russell, NP  REFERRING CLINICIAN: Elspeth Ore, MD    CLINICAL INFORMATION/HISTORY: 09/18/2023:  [name] is an 83 year old male with a history of obstructive sleep apnea on CPAP. He returns today for follow-up. He reports that CPAP is working well.  Mr Dai is an established CPAP user and  currently on auto- CPAP , set between 8 - 14 cm water  pressure ,with 1 cm water  pressure EPR,  residual AHI of 7.9/h  all obstructive , 95% pressure was 14 cm water . Now seeing [facility] for NP: history of peripheral neuropathy since around ~2008. He has numbness, tingling, and weakness in the feet and lower legs. He has been wearing bilateral AFOs for bilateral foot drop which significant helped with gait. He also uses a cane. No falls in the past year. He was initially evaluated by spine specialist to be sure symptoms were not stemming from his back. He was then evaluated at [facility] and had EMG which confirmed the presence of neuropathy, attributed to ETOH.        Epworth sleepiness score: 3 /24.   BMI: 36 kg/m   Neck Circumference: NA   FINDINGS:   Sleep Summary:   Total Recording Time (hours, min): 9 hours 36 minutes      Total Sleep Time (hours, min): 8 hours 30 minutes               Percent REM (%): 17.4%                                      Respiratory Indices:   Calculated pAHI (per CMS guideline): 16.5/h.  Applying AASM criteria the AHI would have been 30/h                         REM pAHI:   32.4/h                                              NREM pAHI: 13.2/h                            Positional AHI:   Exclusively supine Snoring: Reaching a mean volume of 41 dB this was mild snoring and present for over a third of total recorded sleep time.                                               Oxygen Saturation Statistics:   Oxygen Saturation (%) Mean: 91% ( low)              O2 Saturation Range (%): Between a nadir at 76% with a maximum of 98%.  There were a total of 130 desaturation events.  O2 Saturation (minutes) <89%: 26 minutes total desaturation time.          Pulse Rate Statistics:   Pulse Mean (bpm):   67 bpm              Pulse Range:   Between a minimum of 57 and a maximum of 102 bpm              IMPRESSION:  This HST confirms the presence of all obstructive sleep apnea at an AHI  of 16.5/h  with a strong REM sleep dependence.  When applying CMS criteria this apnea is moderately severe, under the criteria of the American Academy of sleep medicine this would have been severe apnea.  Concerning is the associated hypoxia with a nadir at 76% and the mean saturation of only 91% there was no tachy-bradycardia noted.   RECOMMENDATION: Continuation of positive airway pressure therapy is indicated due to REM sleep dependence of this apnea from and associated hypoxia.  CPAP therapy also treats snoring, aside from lifting the saturation nadir and reducing apnea and hypopnea significantly.  My recommendation for this experienced CPAP user is to continue therapy by auto- titration CPAP device again, set between 7 and 17 cm water  pressure, 3 cm H20 EPR, heated humidification and mask of patient's choice.  In addition, weight loss could be very beneficial. Intake of Alcohol  and other substances and medication will increase the AHI and leads to louder snoring as well.      INTERPRETING PHYSICIAN:  Dedra Gores, MD  [facility] Sleep Board certified by The [employer] of Sleep Medicine and Diplomate of the [employer] of Sleep Medicine. Board certified In Neurology through the ABPN, Fellow of the [employer] of Neurology.     Cc Dr Nichole, MD

## 2024-01-09 NOTE — Telephone Encounter (Signed)
 Lmvm for pt to return call for sleep study results.

## 2024-01-09 NOTE — Telephone Encounter (Signed)
-----   Message from [facility] sent at 01/05/2024 10:45 AM EDT ----- Please let patient know that his home sleep study continues to show apnea.  Will send an order in for new machine ----- Message ----- From: Chalice Saunas, MD Sent: 01/05/2024   9:05 AM EDT To: Duwaine Russell, NP

## 2024-01-15 NOTE — Telephone Encounter (Signed)
 Pt returning call regarding sleep study results

## 2024-01-16 NOTE — Telephone Encounter (Signed)
 Spoke with patient and discussed sleep study results as noted below by [facility] NP. Pt verbalized understanding and is ok with continuing to use [facility] for DME. Order sent to them. Pt will watch for a call within 1 week and then will notify us  or [facility] if he hasn't heard. Patient's questions were answered. He was reminded, per insurance requirement, to use his machine at least 4 hours each night. He will also need to be seen within 30 - 90 days after setup. Pt was scheduled for a video visit on 10/27 at 1130. 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 He thanked me for the call.  Order sent to [facility].

## 2024-01-16 NOTE — Telephone Encounter (Signed)
 New, Maryella Shivers, Otilio Jefferson, RN; Alain Honey; Jeris Penta, [place]; 1 other Received, thank you!

## 2024-01-18 ENCOUNTER — Other Ambulatory Visit: Payer: Self-pay | Admitting: Sports Medicine

## 2024-01-30 NOTE — Progress Notes (Signed)
 done

## 2024-01-30 NOTE — Addendum Note (Signed)
 Addended by: HILLIARD HEATHER CROME on: 01/30/2024 11:55 AM   Modules accepted: Orders

## 2024-02-15 DIAGNOSIS — G4733 Obstructive sleep apnea (adult) (pediatric): Secondary | ICD-10-CM | POA: Diagnosis not present

## 2024-02-19 DIAGNOSIS — D2261 Melanocytic nevi of right upper limb, including shoulder: Secondary | ICD-10-CM | POA: Diagnosis not present

## 2024-02-19 DIAGNOSIS — L821 Other seborrheic keratosis: Secondary | ICD-10-CM | POA: Diagnosis not present

## 2024-02-19 DIAGNOSIS — D485 Neoplasm of uncertain behavior of skin: Secondary | ICD-10-CM | POA: Diagnosis not present

## 2024-02-19 DIAGNOSIS — L570 Actinic keratosis: Secondary | ICD-10-CM | POA: Diagnosis not present

## 2024-02-19 DIAGNOSIS — D225 Melanocytic nevi of trunk: Secondary | ICD-10-CM | POA: Diagnosis not present

## 2024-02-19 DIAGNOSIS — D2262 Melanocytic nevi of left upper limb, including shoulder: Secondary | ICD-10-CM | POA: Diagnosis not present

## 2024-03-07 ENCOUNTER — Other Ambulatory Visit: Payer: Self-pay | Admitting: Sports Medicine

## 2024-03-17 DIAGNOSIS — G4733 Obstructive sleep apnea (adult) (pediatric): Secondary | ICD-10-CM | POA: Diagnosis not present

## 2024-03-21 ENCOUNTER — Ambulatory Visit (INDEPENDENT_AMBULATORY_CARE_PROVIDER_SITE_OTHER): Admitting: Sports Medicine

## 2024-03-21 VITALS — BP 121/61 | Ht 68.0 in | Wt 250.0 lb

## 2024-03-21 DIAGNOSIS — M792 Neuralgia and neuritis, unspecified: Secondary | ICD-10-CM

## 2024-03-21 NOTE — Assessment & Plan Note (Signed)
 This is well controlled on his gabapentin .  We will continue this at 600 bid.  Use his AFOs for any walking.

## 2024-03-21 NOTE — Progress Notes (Signed)
 PCP: [name], MD  Subjective:   HPI: Patient is a 83 y.o. male here for follow-up of peripheral neuropathy.  He is currently taking 600 mg gabapentin  twice a day and doing well on that dose.  He is not having pain from his neuropathy.  He wears bilateral AFO braces due to chronic bilateral foot drop.  He reports bilateral lower extremity edema for years that has worsened recently. Typically worse in the morning, wife notices it when she helps put on his shoes.   Past Medical History:  Diagnosis Date   Allergy    Anal fissure    Arthritis    shoulder, knees   Asthma    Atrophic gastritis without mention of hemorrhage    Cataract    bilateraly   Esophageal reflux    Family history of gastric cancer    Fibula fracture    hair line fracture   Gastric polyps    history   Hiatal hernia    Hx of adenomatous colonic polyps    Hyperlipidemia    Hypertension    Metaplasia of esophagus 2011   gastric metaplasia   Morbid obesity (HCC)    Obstructive sleep apnea on CPAP    Other voice and resonance disorders    Peripheral neuropathy    Polyposis syndrome gastric and colonic - attenuated 12/09/2015   Sleep apnea    uses CPAP   Squamous cell skin cancer 04/2018   lesion left forearm   Unspecified asthma(493.90)     Current Outpatient Medications on File Prior to Visit  Medication Sig Dispense Refill   ADVAIR DISKUS 250-50 MCG/DOSE AEPB Inhale 1 puff into the lungs 2 (two) times daily.     albuterol (PROVENTIL HFA;VENTOLIN HFA) 108 (90 BASE) MCG/ACT inhaler Inhale 2 puffs into the lungs every 6 (six) hours as needed for wheezing or shortness of breath. (Patient not taking: Reported on 09/18/2023)     AMBULATORY NON FORMULARY MEDICATION Tonnie Chain 2 1/2   Foot drop, bilateral  Codes: M21.371, M21.372 1 Device 1   ANDROGEL  PUMP 20.25 MG/ACT (1.62%) GEL Apply 2 Pump topically 2 (two) times daily.     ascorbic acid (VITAMIN C) 500 MG tablet Take 500 mg by mouth daily.      aspirin 81 MG tablet Take 81 mg by mouth daily.     Cyanocobalamin  (B-12) 1000 MCG TABS Take 1,000 Units by mouth daily.     cycloSPORINE (RESTASIS) 0.05 % ophthalmic emulsion Place 1 drop into both eyes 2 (two) times daily.     ezetimibe (ZETIA) 10 MG tablet Take 10 mg by mouth daily.     fish oil-omega-3 fatty acids 1000 MG capsule Take 1 g by mouth in the morning and at bedtime.  300 mg /1000 mg fatty acid     folic acid (FOLVITE) 1 MG tablet Take 1 mg by mouth daily.     gabapentin  (NEURONTIN ) 300 MG capsule TAKE 2 CAPSULES(600 MG) BY MOUTH TWICE DAILY 120 capsule 1   hydrochlorothiazide  (HYDRODIURIL ) 25 MG tablet TAKE 1/2 TABLET BY MOUTH EVERY DAY (Patient taking differently: Every other Day.) 60 tablet 0   iron polysaccharides (NIFEREX) 150 MG capsule Take 150 mg by mouth daily.     loratadine (CLARITIN) 10 MG tablet Take 10 mg by mouth daily as needed for allergies.     meloxicam  (MOBIC ) 15 MG tablet TAKE 1 TABLET(15 MG) BY MOUTH DAILY AS NEEDED FOR PAIN 90 tablet 1   minocycline (MINOCIN) 50 MG capsule  Take 50 mg by mouth daily as needed (Rosacea).     MYRBETRIQ 25 MG TB24 tablet Take 25 mg by mouth daily.     NON FORMULARY Pt uses a cpap nightly     omeprazole (PRILOSEC) 40 MG capsule Take 40 mg by mouth daily.     Polyethyl Glycol-Propyl Glycol (SYSTANE ULTRA) 0.4-0.3 % SOLN Place 1-2 drops into both eyes daily as needed (Dry eyes).     pyridOXINE (VITAMIN B6) 25 MG tablet Take 25 mg by mouth daily.     rosuvastatin (CRESTOR) 20 MG tablet Take 20 mg by mouth daily.     Semaglutide-Weight Management (WEGOVY) 2.4 MG/0.75ML SOAJ Inject 2.4 mg into the skin every Wednesday.     zafirlukast (ACCOLATE) 20 MG tablet Take 20 mg by mouth 2 (two) times daily.     No current facility-administered medications on file prior to visit.    Past Surgical History:  Procedure Laterality Date   APPENDECTOMY     CHOLECYSTECTOMY     COLONOSCOPY  08/20/2009 (multiple)   Tubular adenoma    ESOPHAGOGASTRODUODENOSCOPY  08/20/09 (multiple)   EYE SURGERY Bilateral 2023   cataract surgery   INCISION AND DRAINAGE PERIRECTAL ABSCESS  06/27/2005   KNEE SURGERY     arthroscopy- both knees   POLYPECTOMY     REVERSE SHOULDER ARTHROPLASTY Right 08/18/2022   Procedure: REVERSE SHOULDER ARTHROPLASTY;  Surgeon: Dozier Soulier, MD;  Location: [facility];  Service: Orthopedics;  Laterality: Right;   REVERSE SHOULDER ARTHROPLASTY Left 02/16/2023   Procedure: REVERSE SHOULDER ARTHROPLASTY;  Surgeon: Dozier Soulier, MD;  Location: [facility];  Service: Orthopedics;  Laterality: Left;   UPPER GASTROINTESTINAL ENDOSCOPY      Allergies  Allergen Reactions   Beclomethasone Rash   Moxifloxacin Diarrhea    BP 121/61   Ht 5' 8 (1.727 m)   Wt 250 lb (113.4 kg)   BMI 38.01 kg/m       No data to display              No data to display              Objective:  Physical Exam:  Gen: NAD, comfortable in exam room MSK: 1+ pitting edema to BLE below knees, L slightly worse than R. Overlying venous stasis changes to bilateral shins. No calf tenderness or warmth.    Assessment & Plan:  1. Peripheral neuropathy, chronic bilateral leg swelling Stable, suspect leg swelling is exacerbated by neuropathy and limited movement 2/2 foot drop Continue Gabapentin  600mg  BID Compression stockings, elevation Movement as tolerated Look out for foot ulcers and prevent them as best as possible

## 2024-03-22 ENCOUNTER — Telehealth: Payer: Self-pay | Admitting: Adult Health

## 2024-03-22 NOTE — Telephone Encounter (Signed)
 Pt has r/s his initial CPAP f/u, , must be seen by 11/19 to be compliant,pt confirmed will be in [place] on date and time of appointment  Pt understands that although there may be some limitations with this type of visit, we will take all precautions to reduce any security or privacy concerns.  Pt understands that this will be treated like an in office visit and we will file with pt's insurance, and there may be a patient responsible charge related to this service.

## 2024-03-25 DIAGNOSIS — Z0189 Encounter for other specified special examinations: Secondary | ICD-10-CM | POA: Diagnosis not present

## 2024-03-25 DIAGNOSIS — I10 Essential (primary) hypertension: Secondary | ICD-10-CM | POA: Diagnosis not present

## 2024-03-25 DIAGNOSIS — Z125 Encounter for screening for malignant neoplasm of prostate: Secondary | ICD-10-CM | POA: Diagnosis not present

## 2024-03-25 DIAGNOSIS — Z1389 Encounter for screening for other disorder: Secondary | ICD-10-CM | POA: Diagnosis not present

## 2024-03-25 DIAGNOSIS — K219 Gastro-esophageal reflux disease without esophagitis: Secondary | ICD-10-CM | POA: Diagnosis not present

## 2024-03-25 DIAGNOSIS — E785 Hyperlipidemia, unspecified: Secondary | ICD-10-CM | POA: Diagnosis not present

## 2024-03-27 DIAGNOSIS — M25512 Pain in left shoulder: Secondary | ICD-10-CM | POA: Diagnosis not present

## 2024-03-27 DIAGNOSIS — M25511 Pain in right shoulder: Secondary | ICD-10-CM | POA: Diagnosis not present

## 2024-03-27 DIAGNOSIS — G5602 Carpal tunnel syndrome, left upper limb: Secondary | ICD-10-CM | POA: Diagnosis not present

## 2024-03-28 DIAGNOSIS — L0889 Other specified local infections of the skin and subcutaneous tissue: Secondary | ICD-10-CM | POA: Diagnosis not present

## 2024-03-28 DIAGNOSIS — R21 Rash and other nonspecific skin eruption: Secondary | ICD-10-CM | POA: Diagnosis not present

## 2024-04-01 DIAGNOSIS — G609 Hereditary and idiopathic neuropathy, unspecified: Secondary | ICD-10-CM | POA: Diagnosis not present

## 2024-04-01 DIAGNOSIS — H409 Unspecified glaucoma: Secondary | ICD-10-CM | POA: Diagnosis not present

## 2024-04-01 DIAGNOSIS — J45909 Unspecified asthma, uncomplicated: Secondary | ICD-10-CM | POA: Diagnosis not present

## 2024-04-01 DIAGNOSIS — Z0000 Encounter for general adult medical examination without abnormal findings: Secondary | ICD-10-CM | POA: Diagnosis not present

## 2024-04-01 DIAGNOSIS — E291 Testicular hypofunction: Secondary | ICD-10-CM | POA: Diagnosis not present

## 2024-04-01 DIAGNOSIS — Z1339 Encounter for screening examination for other mental health and behavioral disorders: Secondary | ICD-10-CM | POA: Diagnosis not present

## 2024-04-01 DIAGNOSIS — D519 Vitamin B12 deficiency anemia, unspecified: Secondary | ICD-10-CM | POA: Diagnosis not present

## 2024-04-01 DIAGNOSIS — D126 Benign neoplasm of colon, unspecified: Secondary | ICD-10-CM | POA: Diagnosis not present

## 2024-04-01 DIAGNOSIS — Z1331 Encounter for screening for depression: Secondary | ICD-10-CM | POA: Diagnosis not present

## 2024-04-01 DIAGNOSIS — R82998 Other abnormal findings in urine: Secondary | ICD-10-CM | POA: Diagnosis not present

## 2024-04-01 DIAGNOSIS — N401 Enlarged prostate with lower urinary tract symptoms: Secondary | ICD-10-CM | POA: Diagnosis not present

## 2024-04-01 DIAGNOSIS — I10 Essential (primary) hypertension: Secondary | ICD-10-CM | POA: Diagnosis not present

## 2024-04-10 ENCOUNTER — Other Ambulatory Visit: Payer: Self-pay | Admitting: Sports Medicine

## 2024-04-15 DIAGNOSIS — R200 Anesthesia of skin: Secondary | ICD-10-CM | POA: Diagnosis not present

## 2024-04-22 ENCOUNTER — Telehealth: Admitting: Adult Health

## 2024-04-24 DIAGNOSIS — Z23 Encounter for immunization: Secondary | ICD-10-CM | POA: Diagnosis not present

## 2024-04-30 ENCOUNTER — Telehealth: Admitting: Adult Health

## 2024-05-08 ENCOUNTER — Other Ambulatory Visit: Payer: Self-pay | Admitting: Sports Medicine

## 2024-05-20 DIAGNOSIS — R3915 Urgency of urination: Secondary | ICD-10-CM | POA: Diagnosis not present

## 2024-05-20 DIAGNOSIS — N5201 Erectile dysfunction due to arterial insufficiency: Secondary | ICD-10-CM | POA: Diagnosis not present

## 2024-05-22 ENCOUNTER — Telehealth: Admitting: Neurology

## 2024-05-28 ENCOUNTER — Ambulatory Visit: Payer: Medicare PPO | Admitting: Neurology

## 2024-05-29 DIAGNOSIS — G5603 Carpal tunnel syndrome, bilateral upper limbs: Secondary | ICD-10-CM | POA: Diagnosis not present

## 2024-05-29 DIAGNOSIS — G5623 Lesion of ulnar nerve, bilateral upper limbs: Secondary | ICD-10-CM | POA: Diagnosis not present

## 2024-06-04 ENCOUNTER — Ambulatory Visit: Admitting: Sports Medicine

## 2024-06-04 ENCOUNTER — Encounter: Payer: Self-pay | Admitting: Sports Medicine

## 2024-06-04 VITALS — BP 150/72 | Ht 68.5 in | Wt 250.0 lb

## 2024-06-04 DIAGNOSIS — L858 Other specified epidermal thickening: Secondary | ICD-10-CM | POA: Diagnosis not present

## 2024-06-04 DIAGNOSIS — M170 Bilateral primary osteoarthritis of knee: Secondary | ICD-10-CM | POA: Diagnosis not present

## 2024-06-04 DIAGNOSIS — I872 Venous insufficiency (chronic) (peripheral): Secondary | ICD-10-CM

## 2024-06-04 DIAGNOSIS — S76311A Strain of muscle, fascia and tendon of the posterior muscle group at thigh level, right thigh, initial encounter: Secondary | ICD-10-CM

## 2024-06-04 NOTE — Patient Instructions (Addendum)
 Voltaren gel for your knees 1% hydrocortisone cream and skin moisturizer for your leg

## 2024-06-04 NOTE — Progress Notes (Signed)
 [name] A Department of [facility]   PCP: [name], MD  CHIEF COMPLAINT: right posterior hip pain, bilateral knee pain, right posterolateral leg irritation   HPI: Patient is a pleasant 83 y.o. male who presents today for right gluteal pain, bilateral knee pain, and chronic right posterolateral leg skin irritation.  Patient has a long history of of chronic peripheral neuropathy with bilateral foot drop and wears AFO braces at baseline.  Patient also reports history of bilateral knee scopes performed almost 2 decades ago for meniscal repair.  Patient says he first noticed his right gluteal pain a couple months ago.  Denies any acute injury or inciting event.  Describes it as a surveyor, mining in his step that occasionally happens while walking and makes him feel unbalanced.  Has not tried any treatment directly for issue.  Does not describe any other aggravating factors.  He also endorses bilateral nonspecific knee pain that worsens with prolonged ambulation.  He believes it is arthritis as he has not had any recent injuries to his knees.  Lastly, he has had some chronic skin changes on his right posterior lateral leg just laterally to his AFO brace.  Has previously seen dermatologist for this who thought skin changes developed from friction between his leg and the AFO brace.  Endorses mild sensitivity to palpation.  Denies any skin breakdown, erythema, or drainage.  Does struggle with lower extremity swelling and venous insufficiency.   PMH:  Past Medical History:  Diagnosis Date   Allergy    Anal fissure    Arthritis    shoulder, knees   Asthma    Atrophic gastritis without mention of hemorrhage    Cataract    bilateraly   Esophageal reflux    Family history of gastric cancer    Fibula fracture    hair line fracture   Gastric polyps    history   Hiatal hernia    Hx of adenomatous colonic polyps    Hyperlipidemia    Hypertension     Metaplasia of esophagus 2011   gastric metaplasia   Morbid obesity (HCC)    Obstructive sleep apnea on CPAP    Other voice and resonance disorders    Peripheral neuropathy    Polyposis syndrome gastric and colonic - attenuated 12/09/2015   Sleep apnea    uses CPAP   Squamous cell skin cancer 04/2018   lesion left forearm   Unspecified asthma(493.90)     Patient Active Problem List   Diagnosis Date Noted   Left hand pain 12/20/2022   Primary osteoarthritis of both shoulders 01/18/2022   Leg edema, left 03/03/2020   Bilateral swelling of feet and ankles 12/18/2018   Acute right ankle pain 09/18/2018   Foot drop, bilateral 01/25/2018   Morbid obesity with body mass index of 40.0-44.9 in adult [facility]) 12/04/2017   Genetic testing 03/03/2016   Polyposis syndrome gastric and colonic - attenuated 12/09/2015   Pre-operative clearance 11/13/2015   Essential hypertension 11/13/2015   NS (nuclear sclerosis) 03/11/2015   Degenerative arthritis of lumbar spine 09/03/2014   Lumbar and sacral osteoarthritis 09/03/2014   Ocular rosacea 08/19/2014   Incomplete rotator cuff tear 07/22/2014   Conjunctival chalasis 02/12/2014   Dry eye syndrome 02/12/2014   Dry eye 02/12/2014   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   Disease of eyelid 02/12/2014   SPL (spondylolisthesis) 11/27/2013   Family history of gastric cancer 05/16/2013   Family history of cancer of  digestive organ 05/16/2013   Imbalance 06/14/2012   Peripheral neuropathic pain 06/14/2012   Neuropathy 06/14/2012   Glaucoma suspect 11/03/2011   Cataract, nuclear 11/03/2011   Posterior vitreous detachment 11/03/2011   Hole, retinal 11/03/2011   Ache in joint 05/26/2011   Gastro-esophageal reflux disease without esophagitis 05/26/2011   Acid reflux 05/26/2011   Hypercholesterolemia 05/26/2011   Hyperlipidemia 05/27/2009   Morbid obesity (HCC) 05/27/2009   GERD 09/11/2008   History of colonic polyps 09/11/2008   Multiple gastric  polyps 09/11/2008   H/O disease 09/11/2008   HOARSENESS 09/10/2008   OSA on CPAP 11/05/2007   Asthma 10/12/2007   Airway hyperreactivity 10/12/2007   Atrophic gastritis 08/01/2006   AG (atrophic gastritis) 08/01/2006    PSurg:  Past Surgical History:  Procedure Laterality Date   APPENDECTOMY     CHOLECYSTECTOMY     COLONOSCOPY  08/20/2009 (multiple)   Tubular adenoma   ESOPHAGOGASTRODUODENOSCOPY  08/20/09 (multiple)   EYE SURGERY Bilateral 2023   cataract surgery   INCISION AND DRAINAGE PERIRECTAL ABSCESS  06/27/2005   KNEE SURGERY     arthroscopy- both knees   POLYPECTOMY     REVERSE SHOULDER ARTHROPLASTY Right 08/18/2022   Procedure: REVERSE SHOULDER ARTHROPLASTY;  Surgeon: Dozier Soulier, MD;  Location: [facility];  Service: Orthopedics;  Laterality: Right;   REVERSE SHOULDER ARTHROPLASTY Left 02/16/2023   Procedure: REVERSE SHOULDER ARTHROPLASTY;  Surgeon: Dozier Soulier, MD;  Location: [facility];  Service: Orthopedics;  Laterality: Left;   UPPER GASTROINTESTINAL ENDOSCOPY      Allergies: Beclomethasone and Moxifloxacin  Meds:  Previous Medications   ADVAIR DISKUS 250-50 MCG/DOSE AEPB    Inhale 1 puff into the lungs 2 (two) times daily.   ALBUTEROL (PROVENTIL HFA;VENTOLIN HFA) 108 (90 BASE) MCG/ACT INHALER    Inhale 2 puffs into the lungs every 6 (six) hours as needed for wheezing or shortness of breath.   AMBULATORY NON FORMULARY MEDICATION    Tonnie Chain 2 1/2   Foot drop, bilateral  Codes: M21.371, M21.372   ANDROGEL  PUMP 20.25 MG/ACT (1.62%) GEL    Apply 2 Pump topically 2 (two) times daily.   ASCORBIC ACID (VITAMIN C) 500 MG TABLET    Take 500 mg by mouth daily.   ASPIRIN 81 MG TABLET    Take 81 mg by mouth daily.   CYANOCOBALAMIN  (B-12) 1000 MCG TABS    Take 1,000 Units by mouth daily.   CYCLOSPORINE (RESTASIS) 0.05 % OPHTHALMIC EMULSION    Place 1 drop into both eyes 2 (two) times daily.   EZETIMIBE (ZETIA) 10 MG TABLET    Take 10 mg by mouth daily.   FISH  OIL-OMEGA-3 FATTY ACIDS 1000 MG CAPSULE    Take 1 g by mouth in the morning and at bedtime.  300 mg /1000 mg fatty acid   FOLIC ACID (FOLVITE) 1 MG TABLET    Take 1 mg by mouth daily.   GABAPENTIN  (NEURONTIN ) 300 MG CAPSULE    TAKE 2 CAPSULES(600 MG) BY MOUTH TWICE DAILY   HYDROCHLOROTHIAZIDE  (HYDRODIURIL ) 25 MG TABLET    TAKE 1/2 TABLET BY MOUTH EVERY DAY   IRON POLYSACCHARIDES (NIFEREX) 150 MG CAPSULE    Take 150 mg by mouth daily.   LORATADINE (CLARITIN) 10 MG TABLET    Take 10 mg by mouth daily as needed for allergies.   MELOXICAM  (MOBIC ) 15 MG TABLET    TAKE 1 TABLET(15 MG) BY MOUTH DAILY AS NEEDED FOR PAIN   MINOCYCLINE (MINOCIN) 50 MG CAPSULE  Take 50 mg by mouth daily as needed (Rosacea).   MYRBETRIQ 25 MG TB24 TABLET    Take 25 mg by mouth daily.   NON FORMULARY    Pt uses a cpap nightly   OMEPRAZOLE (PRILOSEC) 40 MG CAPSULE    Take 40 mg by mouth daily.   POLYETHYL GLYCOL-PROPYL GLYCOL (SYSTANE ULTRA) 0.4-0.3 % SOLN    Place 1-2 drops into both eyes daily as needed (Dry eyes).   PYRIDOXINE (VITAMIN B6) 25 MG TABLET    Take 25 mg by mouth daily.   ROSUVASTATIN (CRESTOR) 20 MG TABLET    Take 20 mg by mouth daily.   SEMAGLUTIDE-WEIGHT MANAGEMENT (WEGOVY) 2.4 MG/0.75ML SOAJ    Inject 2.4 mg into the skin every Wednesday.   ZAFIRLUKAST (ACCOLATE) 20 MG TABLET    Take 20 mg by mouth 2 (two) times daily.    Social:  Social History   Tobacco Use   Smoking status: Former    Current packs/day: 0.00    Average packs/day: 2.0 packs/day for 10.0 years (20.0 ttl pk-yrs)    Types: Cigarettes    Start date: 02/17/1964    Quit date: 02/16/1974    Years since quitting: 50.3   Smokeless tobacco: Never  Substance Use Topics   Alcohol  use: Yes    Alcohol /week: 7.0 standard drinks of alcohol     Types: 7 Glasses of wine per week    Comment: moderate wine/cocktail    REVIEW OF SYSTEMS:  ROS negative except as noted in HPI above   Objective Exam:  Vitals:   06/04/24 1144  BP: (!) 150/72   Weight: 250 lb (113.4 kg)  Height: 5' 8.5 (1.74 m)    GENERAL: Patient is afebrile, Vital signs reviewed, well appearing, Patient appears comfortable, Alert and lucid. No apparent distress.   Physical Exam   Ortho Exam: Right Gluteal Pain: Patient has pain to palpation over ischial tuberosity on his right side.  Patient still has adequate range of motion of the right hip with mild limitations of both internal/external range of motion.  Patient has 5/5 strength in right hip.  Patient does have mild reproduction of pain with straight leg extension and resisted knee flexion.  Negative FABER/FADIR testing.  Negative Stinchfield testing.  Negative logroll testing.  Negative pain to palpation over greater trochanter.  Mildly diminished right hip abduction strength but does not reproduce any pain.  Neurovascular intact distally.  Bilateral Knee Exam: On inspection of bilateral knees patient has no erythema, ecchymosis, and no significant joint effusion present.  Patient does have mild tenderness to palpation on bilateral medial joint lines.  Patient has limited range of motion with both flexion and extension.  Able to extend to with 10 degrees of flexion remaining likely secondary to osteoarthritic flexion contracture and able to flex his knee to 100 degrees bilaterally.  Patient's strength is 5/5 bilaterally in lower extremities with the exception of loss of dorsiflexion bilaterally.  Patient is neurovascularly intact distally with mildly diminished sensation to light touch.  Varus valgus stress testing negative.  Patellofemoral compression test negative.  Anterior posterior drawer negative.  Reg Posterolateral Leg Skin Irritation:  7 cm hyperkeratotic skin formation on right posterolateral leg. No surrounding erythema or induration.  No drainage.  No open wounds present.  No bruising present.  Appears to be chronic skin changes secondary to venous insufficiency versus friction from AFO  bracing.  RESULTS:  Labs: No results found for this or any previous visit (from the past 48 hours).  Imaging:  No orders to display    Assessment/Plan:  1. Strain of right hamstring, initial encounter   2. Bilateral primary osteoarthritis of knee   3. Chronic venous insufficiency   4. Hyperkeratotic dermatoses of right posterolateral leg    -For hamstring strain/tendinosis we will give patient a modified askling exercises that he will work on as part of a home exercise program.  He is to perform these exercises daily and he will follow-up in 6 weeks or so to recheck to see if he is having improvement with his symptoms. - For bilateral knee pain likely secondary to osteoarthritis we elected to add additional padding into his foot where and AFO braces to help act as a soft absorber and take pressure off his knees.  Scaphoid pad was added to bilateral shoes and additional replacements were given. -Also discussed future option of corticosteroid injection into bilateral knee joints however we will reserve this treatment for when he is in a more acute flare.  Does not feel like pain is severe enough to limit his activities of daily living. - In terms of the hyperkeratotic dermatosis on his right posterior lateral leg either secondary to chronic venous insufficiency or friction irritation from his AFO we recommended application of of over-the-counter hydrocortisone cream twice daily for the next month or so.  He is to alternate this with Vaseline to help moisturize and protect area of concern. - Will follow-up for all issues in 6-12 weeks or at patient's discretion.  Patient understands and agrees to treatment plan.  No further questions or concerns at this time.  New Prescriptions   No medications on file    Medications, medical history, allergies, surgical history, hospitalizations, family history, social history, ROS and vitals entered by nursing staff and reviewed by myself.  I discussed  with the patient the diagnosis, treatment plan, indications for return to the emergency department, and for expected follow-up. The patient verbalized an understanding. The patient is asked if there are any questions or concerns. We discuss the case, until all issues are addressed to the patient's satisfaction.  Follow up per instructions including returning for additional office visit if symptoms worsen or proceeding to the emergency department or urgent care in the next 12-24hrs if there is an acute concerning increasing symptoms, pain, fevers, or other symptoms.  Prentice Agent, DO  5:24 PM, 06/04/2024

## 2024-06-12 ENCOUNTER — Other Ambulatory Visit: Payer: Self-pay | Admitting: Sports Medicine

## 2024-07-11 ENCOUNTER — Other Ambulatory Visit: Payer: Self-pay | Admitting: Sports Medicine

## 2024-09-23 ENCOUNTER — Ambulatory Visit: Admitting: Adult Health
